# Patient Record
Sex: Male | Born: 1970 | Race: White | Hispanic: No | Marital: Single | Smoking: Never smoker
Health system: Southern US, Community
[De-identification: ages and names within clinical notes are randomized; demographics above are authoritative.]

## PROBLEM LIST (undated history)

## (undated) DIAGNOSIS — F319 Bipolar disorder, unspecified: Secondary | ICD-10-CM

## (undated) DIAGNOSIS — K219 Gastro-esophageal reflux disease without esophagitis: Secondary | ICD-10-CM

## (undated) DIAGNOSIS — G2401 Drug induced subacute dyskinesia: Secondary | ICD-10-CM

## (undated) DIAGNOSIS — F202 Catatonic schizophrenia: Secondary | ICD-10-CM

## (undated) DIAGNOSIS — R4701 Aphasia: Secondary | ICD-10-CM

## (undated) DIAGNOSIS — F259 Schizoaffective disorder, unspecified: Secondary | ICD-10-CM

## (undated) HISTORY — DX: Gastro-esophageal reflux disease without esophagitis: K21.9

## (undated) HISTORY — DX: Schizoaffective disorder, unspecified: F25.9

## (undated) SURGERY — ESOPHAGOGASTRODUODENOSCOPY (EGD) WITH PROPOFOL
Anesthesia: Monitor Anesthesia Care

---

## 2018-11-14 DIAGNOSIS — I10 Essential (primary) hypertension: Secondary | ICD-10-CM | POA: Diagnosis not present

## 2018-11-21 DIAGNOSIS — M7918 Myalgia, other site: Secondary | ICD-10-CM | POA: Diagnosis not present

## 2018-12-07 ENCOUNTER — Ambulatory Visit (HOSPITAL_COMMUNITY): Payer: Self-pay | Admitting: Psychiatry

## 2019-01-03 ENCOUNTER — Other Ambulatory Visit: Payer: Self-pay

## 2019-01-03 ENCOUNTER — Encounter (HOSPITAL_COMMUNITY): Payer: Self-pay | Admitting: Psychiatry

## 2019-01-03 ENCOUNTER — Ambulatory Visit (INDEPENDENT_AMBULATORY_CARE_PROVIDER_SITE_OTHER): Payer: Medicare Other | Admitting: Psychiatry

## 2019-01-03 DIAGNOSIS — F250 Schizoaffective disorder, bipolar type: Secondary | ICD-10-CM | POA: Diagnosis not present

## 2019-01-03 DIAGNOSIS — G2571 Drug induced akathisia: Secondary | ICD-10-CM | POA: Diagnosis not present

## 2019-01-03 DIAGNOSIS — T43505A Adverse effect of unspecified antipsychotics and neuroleptics, initial encounter: Secondary | ICD-10-CM | POA: Diagnosis not present

## 2019-01-03 MED ORDER — LORAZEPAM 0.5 MG PO TABS: 0.5000 mg | ORAL_TABLET | Freq: Three times a day (TID) | ORAL | 2 refills | Status: DC | PRN

## 2019-01-03 MED ORDER — POLYETHYLENE GLYCOL 3350 17 G PO PACK: 17.0000 g | PACK | Freq: Every day | ORAL | 0 refills | Status: DC | PRN

## 2019-01-03 MED ORDER — VITAMIN B 12 500 MCG PO TABS: 500.0000 ug | ORAL_TABLET | Freq: Every day | ORAL | 0 refills | Status: DC

## 2019-01-03 MED ORDER — SERTRALINE HCL 50 MG PO TABS: 150.0000 mg | ORAL_TABLET | Freq: Every day | ORAL | 0 refills | Status: DC

## 2019-01-03 MED ORDER — OMEPRAZOLE 40 MG PO CPDR: 40.0000 mg | DELAYED_RELEASE_CAPSULE | Freq: Every day | ORAL | 0 refills | Status: DC

## 2019-01-03 MED ORDER — PALIPERIDONE ER 6 MG PO TB24: 6.0000 mg | ORAL_TABLET | Freq: Every day | ORAL | 0 refills | Status: DC

## 2019-01-03 MED ORDER — TRAZODONE HCL 50 MG PO TABS: 50.0000 mg | ORAL_TABLET | Freq: Every day | ORAL | 0 refills | Status: DC

## 2019-01-03 MED ORDER — ATENOLOL 25 MG PO TABS: 25.0000 mg | ORAL_TABLET | Freq: Every day | ORAL | 0 refills | Status: DC

## 2019-01-03 NOTE — Progress Notes (Signed)
Psychiatric Initial Adult Assessment   Patient Identification: Troy Fox MRN:  [id number] Date of Evaluation:  01/03/2019 Referral Source: self Chief Complaint:  Depression, apathy, tremor Visit Diagnosis:    ICD-10-CM   1. Schizoaffective disorder, bipolar type (HCC) F25.0   2. Acute neuroleptic-induced akathisia G25.71    T43.505A    Interview was conducted using WebEx teleconferencing application and I verified that I was speaking with the correct person using two identifiers. I discussed the limitations of evaluation and management by telemedicine and  the availability of in person appointments. Patient expressed understanding and agreed to proceed.  History of Present Illness:  This is the initial visit to establish care for this 48 yo single WM who has recently moved to the area from [place], [place]. Patient presents with pronounced psychomotor retardation, minimal verbal output, and most information is provided by his parents (father is his HCPOA). Patient has along hx of schizoaffective disorder bipolar type and has been psychiatrically hospitalized first tine at age 86. Several hospitalizations followed (all in [place]) with most recentin November 2019 at [facility] for psychosis with catatonia thought to be precipitated by dehydration and malnutrition (patient has been living alone in an apartment there). He was first on medical floor then transferred to psychiatry. He received IV fluids then 8 courses of ECT. From the hospital he was discharged to [facility] - a residential treatment facility where he stayed until February 8th when he was briught to [facility] by his father. Emeal now will remain with his parents. All current medications were given on discharge from hospital. Previously he he has been under CTT (community treatment team) care there. He has Medicare in addition to [facility] and therefore is not eligible for ACT services in [place]. Patient admits to still feeling  depressed, albeit not suicidal, and both he and parents would like him to start individual counseling.  Patient denies ever abusing alcohol or street drugs. He has hx of hallucinations and delusions (these were present at the time of most recent admission but he denies having any at this time). His hx of medication compliance is unknown. He is not suicidal and denies ever attempting suicide. He has been having a lot of pacing/restlessness and has tremor. He has pronounced negative sx: apathy, cognitive slowing, avolition, flat affect, slow, concrete speech with long latency. He needs prompting/encouragement to answer questions. Parents report that he sleeps a lot - goes to bed around 9 PM, they wake him up 12 hours later to give him AM meds but he goes back to sleep to finally wake up around noon.  Medically he complains of stomach pain but while in the hospital he had EGD and colonoscopy and no abnormalities were noted besides diverticula. He has chronic problems with constipation and is on Miralax qod and Prilosec. He also is on vitamin B12 1000 mcg daily and atenolol presumably for akathisia. Patient is reportedly allergic to aripiprazole but type of allergy is unknown?  Associated Signs/Symptoms: Depression Symptoms:  depressed mood, psychomotor retardation, difficulty concentrating, impaired memory, loss of energy/fatigue, (Hypo) Manic Symptoms:  None Anxiety Symptoms:  None reported Psychotic Symptoms:  None at this time PTSD Symptoms: Negative  Past Psychiatric History: see above  Previous Psychotropic Medications: Yes   Substance Abuse History in the last 12 months:  No.  Consequences of Substance Abuse: Negative NA  Past Medical History:  Past Medical History:  Diagnosis Date  . GERD (gastroesophageal reflux disease)   . Schizoaffective disorder (HCC)  History reviewed. No pertinent surgical history.  Family Psychiatric History: Unknown - patient was adopted.  Family  History:  Family History  Adopted: Yes  Family history unknown: Yes    Social History:   Social History   Socioeconomic History  . Marital status: Single    Spouse name: Not on file  . Number of children: Not on file  . Years of education: Not on file  . Highest education level: Not on file  Occupational History  . Not on file  Social Needs  . Financial resource strain: Not on file  . Food insecurity:    Worry: Not on file    Inability: Not on file  . Transportation needs:    Medical: Not on file    Non-medical: Not on file  Tobacco Use  . Smoking status: Never Smoker  . Smokeless tobacco: Never Used  Substance and Sexual Activity  . Alcohol use: Never    Frequency: Never  . Drug use: Never  . Sexual activity: Not Currently  Lifestyle  . Physical activity:    Days per week: Not on file    Minutes per session: Not on file  . Stress: Not on file  Relationships  . Social connections:    Talks on phone: Not on file    Gets together: Not on file    Attends religious service: Not on file    Active member of club or organization: Not on file    Attends meetings of clubs or organizations: Not on file    Relationship status: Not on file  Other Topics Concern  . Not on file  Social History Narrative  . Not on file     Allergies:  Allergies not on file  Metabolic Disorder Labs: No results found for: HGBA1C, MPG No results found for: PROLACTIN No results found for: CHOL, TRIG, HDL, CHOLHDL, VLDL, LDLCALC No results found for: TSH  Therapeutic Level Labs: No results found for: LITHIUM No results found for: CBMZ No results found for: VALPROATE  Current Medications: Current Outpatient Medications  Medication Sig Dispense Refill  . atenolol (TENORMIN) 25 MG tablet Take 1 tablet (25 mg total) by mouth daily. 90 tablet 0  . Cyanocobalamin (VITAMIN B 12) 500 MCG TABS Take 500 mcg by mouth daily. 90 tablet 0  . LORazepam (ATIVAN) 0.5 MG tablet Take 1 tablet (0.5 mg  total) by mouth every 8 (eight) hours as needed for anxiety. 90 tablet 2  . omeprazole (PRILOSEC) 40 MG capsule Take 1 capsule (40 mg total) by mouth daily. 90 capsule 0  . paliperidone (INVEGA) 6 MG 24 hr tablet Take 1 tablet (6 mg total) by mouth daily. 90 tablet 0  . polyethylene glycol (MIRALAX) 17 g packet Take 17 g by mouth daily as needed for moderate constipation. 72 packet 0  . sertraline (ZOLOFT) 50 MG tablet Take 3 tablets (150 mg total) by mouth daily. 270 tablet 0  . traZODone (DESYREL) 50 MG tablet Take 1 tablet (50 mg total) by mouth at bedtime. 90 tablet 0   No current facility-administered medications for this visit.     Psychiatric Specialty Exam: Review of Systems  Constitutional: Positive for malaise/fatigue.  HENT: Negative.   Eyes: Negative.   Respiratory: Negative.   Cardiovascular: Negative.   Gastrointestinal: Positive for abdominal pain, constipation and heartburn.  Genitourinary: Negative.   Musculoskeletal: Negative.   Skin: Negative.   Neurological: Positive for tremors.  Endo/Heme/Allergies: Negative.   Psychiatric/Behavioral: Positive for depression.  There were no vitals taken for this visit.There is no height or weight on file to calculate BMI.  General Appearance: Casual and very slow to respond to quetions (selectively).  Eye Contact:  Minimal  Speech:  Blocked and Slow  Volume:  Decreased  Mood:  Depressed  Affect:  Flat  Thought Process:  Concrete  Orientation:  Other:  self, place, environment/parants  Thought Content:  Denies having AVH, paranoia.  Suicidal Thoughts:  No  Homicidal Thoughts:  No  Memory:  impossible to test but appears to be markedly impaired.  Judgement:  Other:  limited  Insight:  Shallow  Psychomotor Activity:  EPS, Psychomotor Retardation and Tremor  Concentration:  Concentration: Poor  Recall:  Poor  Fund of Knowledge:Poor  Language: Poor  Akathisia:  Not observed but parants report him to be pacing often   Handed:  Right  AIMS (if indicated):  not done  Assets:  Housing Social Support  ADL's:  Intact  Cognition: Impaired,  Mild  Sleep:  Excessive    Assessment and Plan: This is the initial visit to establish care for this 48 yo single WM who has recently moved to the area from [place], [place]. Patient presents with pronounced psychomotor retardation, minimal verbal output, and most information is provided by his parents (father is his HCPOA). Patient has along hx of schizoaffective disorder bipolar type and has been psychiatrically hospitalized first tine at age 41. Several hospitalizations followed (all in [place]) with most recentin November 2019 at [facility] for psychosis with catatonia thought to be precipitated by dehydration and malnutrition (patient has been living alone in an apartment there). He was first on medical floor then transferred to psychiatry. He received IV fluids then 8 courses of ECT. From the hospital he was discharged to [facility] - a residential treatment facility where he stayed until February 8th when he was briught to [facility] by his father. Kelsie now will remain with his parents. All current medications were given on discharge from hospital. Previously he he has been under CTT (community treatment team) care there. He has Medicare in addition to [facility] and therefore is not eligible for ACT services in [place]. Patient admits to still feeling depressed, albeit not suicidal, and both he and parents would like him to start individual counseling.  Patient denies ever abusing alcohol or street drugs. He has hx of hallucinations and delusions (these were present at the time of most recent admission but he denies having any at this time). His hx of medication compliance is unknown. He is not suicidal and denies ever attempting suicide. He has been having a lot of pacing/restlessness and has tremor. He has pronounced negative sx: apathy, cognitive slowing, avolition,  flat affect, slow, concrete speech with long latency. He needs prompting/encouragement to answer questions. Parents report that he sleeps a lot - goes to bed around 9 PM, they wake him up 12 hours later to give him AM meds but he goes back to sleep to finally wake up around noon. Father reports that he has brought last hospitalization record to our office but I have no access to it at this time.  Dx impression: Schizoaffective disorder bipolar type, currently depressed; Neuroleptic induced EPS/akathisia  Plan: Continue recent medications with three changes intended to minimize level of sedation/EPS: decrease Invega from 9 to 6 mg daily, trazodone from 100 mg to 50 mg at HS, and increase sertraline from 100 mg to 150 mg to decrease depression. I also recommended using  lorazepam 0.5 mg tid on prn not scheduled basis. Parents would like him to have individual counseling although I have serious doubts that will benefit at this time due to pronounced negative sx including poor memory (possible a consequence of ECT). This patient would be theoretically more appropriate for a [facility] services and we should look into such option availability in the place he will be living. The plan was discussed with patient and more so with his father. I spend 60 minutes in face to face clinical contact with the patient via Webex telemedicine application and devoted approximately 50% of this time to explanation of diagnosis, discussion of treatment options and med education. Next appointment in early June.    Magdalene Patricialgierd A Gustie Bobb, MD 4/22/202012:09 PM

## 2019-01-10 ENCOUNTER — Telehealth (HOSPITAL_COMMUNITY): Payer: Self-pay

## 2019-01-10 NOTE — Telephone Encounter (Signed)
Acknowledged. He was on a relatively low dose of lorazepam 0.5 mg tid so should not have a protracted withdrawal.  OP

## 2019-01-10 NOTE — Telephone Encounter (Signed)
Patients father is calling,he states that patient is currently withdrawing from the Ativan, they are giving him I tablet at bedtime. He states that patient is also still having some nausea. The plan that patients father and I discussed is to continue the 1 ativan at bedtime for now, they have been doing this for about a week, if his anxiety persists then they will call back and I will discuss a non addictive anxiety medication with the doctor. For nausea I told patients father to make sure patient is taking medications with food and continue use of Pepto as that seems to be helping. If they have any further questions they will call me back to discuss.

## 2019-01-16 DIAGNOSIS — Z0000 Encounter for general adult medical examination without abnormal findings: Secondary | ICD-10-CM | POA: Diagnosis not present

## 2019-01-17 ENCOUNTER — Telehealth (HOSPITAL_COMMUNITY): Payer: Self-pay

## 2019-01-17 NOTE — Telephone Encounter (Signed)
He can try BuSpar 5 mg up to twice a day.  Sometimes BuSpar makes people groggy and he need to watch for that.  Please call the prescription to his pharmacy.

## 2019-01-17 NOTE — Telephone Encounter (Signed)
This is a patient of Dr. Hinton Dyer - He is tapering down off of the Ativan and only taking 1 tablet at bedtime. However patient is still having a lot of anxiety and is wanting to know if he can try Buspar as it is not addictive. Please see past messages regarding this. Thank you

## 2019-01-18 ENCOUNTER — Telehealth (HOSPITAL_COMMUNITY): Payer: Self-pay

## 2019-01-18 MED ORDER — BUSPIRONE HCL 5 MG PO TABS: 5.0000 mg | ORAL_TABLET | Freq: Two times a day (BID) | ORAL | 0 refills | Status: DC

## 2019-01-18 NOTE — Telephone Encounter (Signed)
Thank you, called patients dad

## 2019-01-18 NOTE — Telephone Encounter (Signed)
Dr. Lolly Mustache reviewed the chart and prescribed Buspar 5 mg BID, I called patients father to let him know and he mentioned patient has some Tardive Dyskinesia - finger and toes. We discussed Ingrezza and he wants to know your opinion. Please review and advise, thank you

## 2019-01-19 DIAGNOSIS — M7918 Myalgia, other site: Secondary | ICD-10-CM | POA: Diagnosis not present

## 2019-01-19 DIAGNOSIS — Z0000 Encounter for general adult medical examination without abnormal findings: Secondary | ICD-10-CM | POA: Diagnosis not present

## 2019-01-19 DIAGNOSIS — Z13220 Encounter for screening for lipoid disorders: Secondary | ICD-10-CM | POA: Diagnosis not present

## 2019-01-20 ENCOUNTER — Emergency Department (HOSPITAL_COMMUNITY)
Admission: EM | Admit: 2019-01-20 | Discharge: 2019-01-20 | Disposition: A | Discharge status: Home / Self Care | Payer: Medicare Other | Attending: Emergency Medicine | Admitting: Emergency Medicine

## 2019-01-20 ENCOUNTER — Encounter (HOSPITAL_COMMUNITY): Payer: Self-pay | Admitting: Emergency Medicine

## 2019-01-20 ENCOUNTER — Other Ambulatory Visit: Payer: Self-pay

## 2019-01-20 DIAGNOSIS — R4182 Altered mental status, unspecified: Secondary | ICD-10-CM | POA: Diagnosis present

## 2019-01-20 DIAGNOSIS — Z79899 Other long term (current) drug therapy: Secondary | ICD-10-CM | POA: Diagnosis not present

## 2019-01-20 DIAGNOSIS — G2401 Drug induced subacute dyskinesia: Secondary | ICD-10-CM | POA: Diagnosis not present

## 2019-01-20 HISTORY — DX: Drug induced subacute dyskinesia: G24.01

## 2019-01-20 LAB — CBC WITH DIFFERENTIAL/PLATELET
Abs Immature Granulocytes: 0.01 10*3/uL (ref 0.00–0.07)
Basophils Absolute: 0 10*3/uL (ref 0.0–0.1)
Basophils Relative: 1 %
Eosinophils Absolute: 0.1 10*3/uL (ref 0.0–0.5)
Eosinophils Relative: 1 %
HCT: 46.3 % (ref 39.0–52.0)
Hemoglobin: 15.8 g/dL (ref 13.0–17.0)
Immature Granulocytes: 0 %
Lymphocytes Relative: 15 %
Lymphs Abs: 1.1 10*3/uL (ref 0.7–4.0)
MCH: 30 pg (ref 26.0–34.0)
MCHC: 34.1 g/dL (ref 30.0–36.0)
MCV: 88 fL (ref 80.0–100.0)
Monocytes Absolute: 0.7 10*3/uL (ref 0.1–1.0)
Monocytes Relative: 10 %
Neutro Abs: 5.2 10*3/uL (ref 1.7–7.7)
Neutrophils Relative %: 73 %
Platelets: 141 10*3/uL — ABNORMAL LOW (ref 150–400)
RBC: 5.26 MIL/uL (ref 4.22–5.81)
RDW: 13.6 % (ref 11.5–15.5)
WBC: 7.1 10*3/uL (ref 4.0–10.5)
nRBC: 0 % (ref 0.0–0.2)

## 2019-01-20 LAB — BASIC METABOLIC PANEL
Anion gap: 11 (ref 5–15)
BUN: 14 mg/dL (ref 6–20)
CO2: 24 mmol/L (ref 22–32)
Calcium: 9.6 mg/dL (ref 8.9–10.3)
Chloride: 108 mmol/L (ref 98–111)
Creatinine, Ser: 0.62 mg/dL (ref 0.61–1.24)
GFR calc Af Amer: >60 mL/min (ref >60)
GFR calc non Af Amer: >60 mL/min (ref >60)
Glucose, Bld: 113 mg/dL — ABNORMAL HIGH (ref 70–99)
Potassium: 3.8 mmol/L (ref 3.5–5.1)
Sodium: 143 mmol/L (ref 135–145)

## 2019-01-20 MED ORDER — BENZTROPINE MESYLATE 1 MG PO TABS: 1.0000 mg | ORAL_TABLET | Freq: Two times a day (BID) | ORAL | 0 refills | Status: DC

## 2019-01-20 MED ORDER — BENZTROPINE MESYLATE 1 MG/ML IJ SOLN
1.0000 mg | Freq: Once | INTRAMUSCULAR | Status: AC
  Administered 2019-01-20: 13:00:00 1 mg via INTRAMUSCULAR
  Filled 2019-01-20: qty 1

## 2019-01-20 NOTE — ED Provider Notes (Signed)
[facility] EMERGENCY DEPARTMENT Provider Note   CSN: [id number] Arrival date & time: 01/20/19  1157    History   Chief Complaint Chief Complaint  Patient presents with  . Altered Mental Status    HPI Troy Fox is a 48 y.o. male with a history of GERD, schizoaffective disorder, tardive dyskinesia presenting to emergency department today with chief complaint of worsening tardive dyskinesia x2 days.  Patient is accompanied by his father today.  His father reports over the last 2 days patient has not wanted to eat, drink and has become more withdrawn and then normal.  He has noticed tremor in patient's mouth, tapping of his left foot and continuous movement of his fingers.    Father also states that patient saw psychiatrist on 01/03/19 and had medication changes.  His trazodone was changed from 100 mg to 50 mg PO daily, Zoloft was increased from 100 mg to 150 p.o. daily and his Invega was decreased from 9 mg to 6 mg p.o. daily.  His Ativan was also changed from 0.5 TID to 0.5 prn p.o. daily.  Father reports patient had an episode of catatonia back in January after significant decreased p.o. intake for unknown length of time.  Patient at that time was living alone in an apartment in [place].  History provided by patient's father with additional history obtained from chart review.       Past Medical History:  Diagnosis Date  . GERD (gastroesophageal reflux disease)   . Schizoaffective disorder (HCC)   . Tardive dyskinesia     Patient Active Problem List   Diagnosis Date Noted  . Schizoaffective disorder, bipolar type (HCC) 01/03/2019  . Acute neuroleptic-induced akathisia 01/03/2019    History reviewed. No pertinent surgical history.      Home Medications    Prior to Admission medications   Medication Sig Start Date End Date Taking? Authorizing Provider  atenolol (TENORMIN) 25 MG tablet Take 1 tablet (25 mg total) by mouth daily. 01/03/19 04/03/19 Yes Pucilowski, Olgierd  A, MD  busPIRone (BUSPAR) 5 MG tablet Take 1 tablet (5 mg total) by mouth 2 (two) times daily. Patient taking differently: Take 5 mg by mouth 2 (two) times daily as needed.  01/18/19  Yes Arfeen, Phillips Grout, MD  Cyanocobalamin (VITAMIN B 12) 500 MCG TABS Take 500 mcg by mouth daily. 01/03/19 04/03/19 Yes Pucilowski, Olgierd A, MD  LORazepam (ATIVAN) 0.5 MG tablet Take 1 tablet (0.5 mg total) by mouth every 8 (eight) hours as needed for anxiety. 01/03/19 04/03/19 Yes Pucilowski, Olgierd A, MD  omeprazole (PRILOSEC) 40 MG capsule Take 1 capsule (40 mg total) by mouth daily. 01/03/19 04/03/19 Yes Pucilowski, Olgierd A, MD  paliperidone (INVEGA) 6 MG 24 hr tablet Take 1 tablet (6 mg total) by mouth daily. 01/03/19 04/03/19 Yes Pucilowski, Olgierd A, MD  polyethylene glycol (MIRALAX) 17 g packet Take 17 g by mouth daily as needed for moderate constipation. 01/03/19 04/03/19 Yes Pucilowski, Olgierd A, MD  sertraline (ZOLOFT) 50 MG tablet Take 3 tablets (150 mg total) by mouth daily. 01/03/19 04/03/19 Yes Pucilowski, Olgierd A, MD  traZODone (DESYREL) 50 MG tablet Take 1 tablet (50 mg total) by mouth at bedtime. 01/03/19 04/03/19 Yes Pucilowski, Olgierd A, MD  benztropine (COGENTIN) 1 MG tablet Take 1 tablet (1 mg total) by mouth 2 (two) times daily for 7 days. 01/20/19 01/27/19  , Caroleen Hamman, PA-C    Family History Family History  Adopted: Yes  Family history unknown: Yes  Social History Social History   Tobacco Use  . Smoking status: Never Smoker  . Smokeless tobacco: Never Used  Substance Use Topics  . Alcohol use: Never    Frequency: Never  . Drug use: Never     Allergies   Patient has no known allergies.   Review of Systems Review of Systems  Unable to perform ROS: Psychiatric disorder  Neurological: Positive for tremors.     Physical Exam Updated Vital Signs BP 135/74 (BP Location: Left Arm)   Pulse 87   Temp 98 F (36.7 C) (Oral)   Resp 14   Ht 5\' 4"  (1.626 m)   Wt 63.5 kg    SpO2 95%   BMI 24.03 kg/m   Physical Exam Vitals signs and nursing note reviewed.  Constitutional:      Appearance: He is well-developed. He is not toxic-appearing.     Comments: Patient sitting comfortably on exam stretcher.  He is nontoxic-appearing. Tremor noted to bottom lip. Pill rolling movement in bilateral hands. Left foot tapping ground repeatedly  HENT:     Head: Normocephalic and atraumatic.     Jaw: There is normal jaw occlusion. No tenderness or swelling.  Eyes:     General: No scleral icterus.       Right eye: No discharge.        Left eye: No discharge.     Conjunctiva/sclera: Conjunctivae normal.  Neck:     Musculoskeletal: Normal range of motion.  Cardiovascular:     Rate and Rhythm: Normal rate and regular rhythm.     Pulses: Normal pulses.          Radial pulses are 2+ on the right side and 2+ on the left side.     Heart sounds: Normal heart sounds.  Pulmonary:     Effort: Pulmonary effort is normal.     Breath sounds: Normal breath sounds.  Abdominal:     General: There is no distension.     Palpations: Abdomen is soft.     Tenderness: There is no abdominal tenderness. There is no guarding or rebound.  Musculoskeletal: Normal range of motion.     Right lower leg: No edema.     Left lower leg: No edema.     Comments: Full ROM of bilateral upper and lower extremities.  Skin:    General: Skin is warm and dry.     Capillary Refill: Capillary refill takes less than 2 seconds.     Findings: No lesion or rash.  Neurological:     Mental Status: He is oriented to person, place, and time.     Comments: Patient responding to simple questions with one-word answers.  He is oriented to self, place and time.  Visual tracking intact.  Sensation is intact to sharp and dull in bilateral upper and lower extremities.  Psychiatric:        Behavior: Behavior normal.        ED Treatments / Results  Labs (all labs ordered are listed, but only abnormal results are  displayed) Labs Reviewed  CBC WITH DIFFERENTIAL/PLATELET - Abnormal; Notable for the following components:      Result Value   Platelets 141 (*)    All other components within normal limits  BASIC METABOLIC PANEL - Abnormal; Notable for the following components:   Glucose, Bld 113 (*)    All other components within normal limits    EKG None  Radiology No results found.  Procedures Procedures (including critical care  time)  Medications Ordered in ED Medications  benztropine mesylate (COGENTIN) injection 1 mg (1 mg Intramuscular Given 01/20/19 1322)     Initial Impression / Assessment and Plan / ED Course  I have reviewed the triage vital signs and the nursing notes.  Pertinent labs & imaging results that were available during my care of the patient were reviewed by me and considered in my medical decision making (see chart for details).   48 year old male with history of schizophrenia presents with worsening tardive dyskinesia x2 days.  Patient is afebrile, nontoxic-appearing.  On exam he has obvious tremor of mouth, pill rolling in fingers, and tremor of left foot. Pt's father is at the bedside and states pt is not catatonic currently, but he is worried it might happen given his decrease in conversation since symptom onset.  On reassessment patient the tremors have improved.  There is no tremor seen to his mouth, the pill-rolling movement is happening less as well as him moving his foot less.  BMP and CBC are overall unremarkable. Pt case discussed with attending Dr. Deretha Emory who agrees with plan to discharge pt with Cogentin PO. His father plans to call his psychiatrist Monday morning to schedule follow-up.  Patient is hemodynamically stable, in NAD. Evaluation does not show pathology that would require ongoing emergent intervention or inpatient treatment. I explained the diagnosis to the patient. Patient's father  is comfortable with above plan and pt is stable for discharge at  this time. All questions were answered prior to disposition. Strict return precautions for returning to the ED were discussed.    This note was prepared with assistance of Conservation officer, historic buildings. Occasional wrong-word or sound-a-like substitutions may have occurred due to the inherent limitations of voice recognition software.   Final Clinical Impressions(s) / ED Diagnoses   Final diagnoses:  Tardive dyskinesia    ED Discharge Orders         Ordered    benztropine (COGENTIN) 1 MG tablet  2 times daily     01/20/19 1430           , [place], PA-C 01/20/19 1439    Vanetta Mulders, MD 01/21/19 [phone]

## 2019-01-20 NOTE — Discharge Instructions (Signed)
You have been seen today for tardive dyskinesia. Please read and follow all provided instructions. Return to the emergency room for worsening condition or new concerning symptoms.    Is important that you follow-up with psychiatry as planned.  Recommend you call first thing Monday morning to schedule an appointment.  1. Medications:  Prescription sent to your pharmacy for Cogentin.  Please take ever 12 hours as prescribed.  We have prescribed this medication for 1 week.  Pscyhiatrist will determine if Dj should continue this medication or if any dose adjustments need to be made.  Continue usual home medications Take medications as prescribed. Please review all of the medicines and only take them if you do not have an allergy to them.  2. Treatment: rest, drink plenty of fluids. Drink boost shakes to help with calorie intake.  3. Follow Up: Please follow up with psychiatry as we discussed.  Follow up with PCP in 2 to 5 days if needed.  It is also a possibility that you have an allergic reaction to any of the medicines that you have been prescribed - Everybody reacts differently to medications and while MOST people have no trouble with most medicines, you may have a reaction such as nausea, vomiting, rash, swelling, shortness of breath. If this is the case, please stop taking the medicine immediately and contact your physician.  ?

## 2019-01-20 NOTE — ED Triage Notes (Signed)
Pt with hx of tardive dyskinesia accompanied by father. Father concerned that patient has not wanted to eat, drink, and has become increasingly withdrawn over the past few days. States this happened in January and patient went into a catatonic state. Pt will not verbally respond to questions.

## 2019-01-22 ENCOUNTER — Telehealth (HOSPITAL_COMMUNITY): Payer: Self-pay

## 2019-01-22 NOTE — Telephone Encounter (Signed)
I think he should try if it works for him and if he tolerates it (I had a patient once whose EPS worsened a lot on Ingrezza). He should start at 40 mg daily. If we have samples of it he could get 7 days and then, if tolerated,  I would send Rx for 80 mg.  OP

## 2019-01-22 NOTE — Telephone Encounter (Signed)
Patient's dad called regarding his son. Father stated he had a bad weekend with patient and had to take him to emergency. They gave him a shot of Cogentin and gave him #14 Cogentin 1 mg pills. The ED doctor suggested maintenance ECT. He was told if father has another episode with him and needs to take him back to the ED, he can. Patient fell out of bed last night and has a hard time bending his knees. Father also stated that at times he has a hard time maneuvering. He also stated that patient has delusional and paranoid thoughts and is obsessing over his dad putting him in the hospital or an institution. Father stated he does not want to do that. He also stated that he has a video of patient's behavior from thismorning (01/22/19) if doctor would like to see it. Patient's dad is very concerned about him and stated he is not sure how to go forward regarding his son. Please review and advise. Thank you.

## 2019-01-23 MED ORDER — VALBENAZINE TOSYLATE 40 & 80 MG PO CPPK: ORAL_CAPSULE | ORAL | 0 refills | Status: DC

## 2019-01-23 NOTE — Telephone Encounter (Signed)
Patient's father had posed a question. Once he receives his son's Ingrezza should his son take both the Ingrezza and the Cogentin? Thank you.

## 2019-01-23 NOTE — Telephone Encounter (Signed)
Spoke with patient's father and relayed recommendations. Ingrezza was sent in to [facility]. I contacted Nedra Hai at [facility] and set up a consultation for Dr. Toni Amend to evaluate patient. Appointment is set up for 01/29/19. A referral was also done for this in Epic. Patient's dad also agrees with continuing his Cogentin, however, patient only has a couple days left of the medication. Father requested a prescription be sent in to [facility] in [place]. He asked that we request the pharmacy to deliver the Cogentin and stated that the pharmacy will do that.  Thank you.

## 2019-01-23 NOTE — Telephone Encounter (Signed)
I agree that he should for now continue Cogentin and that maintenance ECT would be a great choice. At [facility] only place it is done is [facility] (Dr. Toni Amend). We should ask him to evaluate patient. I also agree with a trial of Ingrezza for tardive dyskinesia - left a message about it for Regan yesterday.  OP.

## 2019-01-24 ENCOUNTER — Emergency Department (HOSPITAL_COMMUNITY): Payer: Medicare Other

## 2019-01-24 ENCOUNTER — Emergency Department (HOSPITAL_COMMUNITY)
Admission: EM | Admit: 2019-01-24 | Discharge: 2019-01-24 | Disposition: A | Discharge status: Home / Self Care | Payer: Medicare Other | Attending: Emergency Medicine | Admitting: Emergency Medicine

## 2019-01-24 ENCOUNTER — Telehealth: Payer: Self-pay | Admitting: Licensed Clinical Social Worker

## 2019-01-24 ENCOUNTER — Encounter (HOSPITAL_COMMUNITY): Payer: Self-pay | Admitting: Emergency Medicine

## 2019-01-24 DIAGNOSIS — Z79899 Other long term (current) drug therapy: Secondary | ICD-10-CM | POA: Insufficient documentation

## 2019-01-24 DIAGNOSIS — F22 Delusional disorders: Secondary | ICD-10-CM | POA: Diagnosis not present

## 2019-01-24 DIAGNOSIS — G2401 Drug induced subacute dyskinesia: Secondary | ICD-10-CM | POA: Diagnosis not present

## 2019-01-24 DIAGNOSIS — F259 Schizoaffective disorder, unspecified: Secondary | ICD-10-CM

## 2019-01-24 DIAGNOSIS — F250 Schizoaffective disorder, bipolar type: Secondary | ICD-10-CM | POA: Insufficient documentation

## 2019-01-24 DIAGNOSIS — M545 Low back pain, unspecified: Secondary | ICD-10-CM

## 2019-01-24 DIAGNOSIS — J984 Other disorders of lung: Secondary | ICD-10-CM | POA: Diagnosis not present

## 2019-01-24 DIAGNOSIS — M5489 Other dorsalgia: Secondary | ICD-10-CM | POA: Diagnosis present

## 2019-01-24 DIAGNOSIS — R404 Transient alteration of awareness: Secondary | ICD-10-CM | POA: Diagnosis not present

## 2019-01-24 DIAGNOSIS — R2681 Unsteadiness on feet: Secondary | ICD-10-CM | POA: Diagnosis not present

## 2019-01-24 DIAGNOSIS — R269 Unspecified abnormalities of gait and mobility: Secondary | ICD-10-CM

## 2019-01-24 HISTORY — DX: Aphasia: R47.01

## 2019-01-24 HISTORY — DX: Catatonic schizophrenia: F20.2

## 2019-01-24 HISTORY — DX: Bipolar disorder, unspecified: F31.9

## 2019-01-24 LAB — URINALYSIS, ROUTINE W REFLEX MICROSCOPIC
Bilirubin Urine: NEGATIVE
Glucose, UA: NEGATIVE mg/dL
Hgb urine dipstick: NEGATIVE
Ketones, ur: 20 mg/dL — AB
Leukocytes,Ua: NEGATIVE
Nitrite: NEGATIVE
Protein, ur: NEGATIVE mg/dL
Specific Gravity, Urine: 1.021 (ref 1.005–1.030)
pH: 6 (ref 5.0–8.0)

## 2019-01-24 LAB — COMPREHENSIVE METABOLIC PANEL
ALT: 17 U/L (ref 0–44)
AST: 22 U/L (ref 15–41)
Albumin: 4.1 g/dL (ref 3.5–5.0)
Alkaline Phosphatase: 50 U/L (ref 38–126)
Anion gap: 14 (ref 5–15)
BUN: 17 mg/dL (ref 6–20)
CO2: 25 mmol/L (ref 22–32)
Calcium: 9.7 mg/dL (ref 8.9–10.3)
Chloride: 108 mmol/L (ref 98–111)
Creatinine, Ser: 0.9 mg/dL (ref 0.61–1.24)
GFR calc Af Amer: >60 mL/min (ref >60)
GFR calc non Af Amer: >60 mL/min (ref >60)
Glucose, Bld: 111 mg/dL — ABNORMAL HIGH (ref 70–99)
Potassium: 3.6 mmol/L (ref 3.5–5.1)
Sodium: 147 mmol/L — ABNORMAL HIGH (ref 135–145)
Total Bilirubin: 1.1 mg/dL (ref 0.3–1.2)
Total Protein: 7.6 g/dL (ref 6.5–8.1)

## 2019-01-24 LAB — CBC WITH DIFFERENTIAL/PLATELET
Abs Immature Granulocytes: 0.02 10*3/uL (ref 0.00–0.07)
Basophils Absolute: 0 10*3/uL (ref 0.0–0.1)
Basophils Relative: 0 %
Eosinophils Absolute: 0 10*3/uL (ref 0.0–0.5)
Eosinophils Relative: 0 %
HCT: 46.6 % (ref 39.0–52.0)
Hemoglobin: 15.4 g/dL (ref 13.0–17.0)
Immature Granulocytes: 0 %
Lymphocytes Relative: 10 %
Lymphs Abs: 0.9 10*3/uL (ref 0.7–4.0)
MCH: 29.7 pg (ref 26.0–34.0)
MCHC: 33 g/dL (ref 30.0–36.0)
MCV: 90 fL (ref 80.0–100.0)
Monocytes Absolute: 0.8 10*3/uL (ref 0.1–1.0)
Monocytes Relative: 10 %
Neutro Abs: 6.9 10*3/uL (ref 1.7–7.7)
Neutrophils Relative %: 80 %
Platelets: 124 10*3/uL — ABNORMAL LOW (ref 150–400)
RBC: 5.18 MIL/uL (ref 4.22–5.81)
RDW: 13.2 % (ref 11.5–15.5)
WBC: 8.6 10*3/uL (ref 4.0–10.5)
nRBC: 0 % (ref 0.0–0.2)

## 2019-01-24 LAB — RAPID URINE DRUG SCREEN, HOSP PERFORMED
Amphetamines: NOT DETECTED
Barbiturates: NOT DETECTED
Benzodiazepines: NOT DETECTED
Cocaine: NOT DETECTED
Opiates: NOT DETECTED
Tetrahydrocannabinol: NOT DETECTED

## 2019-01-24 LAB — ETHANOL: Alcohol, Ethyl (B): <10 mg/dL (ref <10)

## 2019-01-24 MED ORDER — BENZTROPINE MESYLATE 1 MG PO TABS
1.0000 mg | ORAL_TABLET | Freq: Once | ORAL | Status: AC
  Administered 2019-01-24: 1 mg via ORAL
  Filled 2019-01-24: qty 1

## 2019-01-24 MED ORDER — ATENOLOL 25 MG PO TABS
25.0000 mg | ORAL_TABLET | Freq: Once | ORAL | Status: AC
  Administered 2019-01-24: 25 mg via ORAL
  Filled 2019-01-24: qty 1

## 2019-01-24 MED ORDER — SERTRALINE HCL 50 MG PO TABS
150.0000 mg | ORAL_TABLET | Freq: Once | ORAL | Status: DC
  Filled 2019-01-24: qty 3

## 2019-01-24 MED ORDER — PALIPERIDONE ER 6 MG PO TB24
6.0000 mg | ORAL_TABLET | Freq: Every day | ORAL | Status: DC
  Filled 2019-01-24 (×3): qty 1
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
## 2019-01-24 NOTE — Clinical Social Work Note (Signed)
Patient presented in the ED due to father, Mr. Perret, being concerned about injury related to a fall. Patient was assessed and cleared by TTS. Patient resides in the home with his father and father's wife. Mr. Madilyn Fireman indicates that patient is not violent, not SI/HI nor is he fearful of patient. He stated that patient got up last night to shave in the middle of the night and fell against the door. He indicated that the noise made by patient falling backwards against the door awakened him.  He stated that he manages patient well during the day but at night he is concerned that patient will fall due to some gait instability. Mr. Jacques was not interested in assisted devices for gait.  He stated that he has ordered security devices but they have not arrived in the home as of yet. Mr. Jonassen stated that he is not interested in LTC for patient and stated "I want to take care of him." He indicated that patient stopped verbalizing a week ago. He has an appointment at 1:00 p.m. with [facility] on Monday at 1:00 as a recommendation from his psychiatrist/doctor and he hopes to get patient involved in a regime of ECT therapy which has assisted in the past. Mr. Leang stated that if there were no short term crisis beds available he would take patient home and manage until his appointment on Monday as he was not interested in ALF/FCH placement.    Troy Fox, Juleen China, LCSW

## 2019-01-24 NOTE — ED Notes (Signed)
Patient's father states that pt was verbal until a week ago. States patient has a history of schizophrenia with Bipolar, and catatonia. States pt has recently expressed to him delusions that the cops are after him to take him to jail and that [name] is inside of him.

## 2019-01-24 NOTE — ED Notes (Signed)
Pt to CT

## 2019-01-24 NOTE — ED Triage Notes (Signed)
Pt here by [facility], pt is non-verbal, called out by father for concerns of variation in gait, concern for pulled back muscle

## 2019-01-24 NOTE — ED Notes (Signed)
Per father, pt is declining mentally. States his son is normally pretty non verbal, but is expressing thoughts of delusions and paranoia. States he has been saying the police are coming to take him. Pt is ambulatory, but that has been declining as well. Is not sure how much longer he is going to be able to care for him. Wants him transferred to [facility]. Explained to him that I did not know if this was possible due to him being nonverbal and non ambulatory.

## 2019-01-24 NOTE — Discharge Instructions (Addendum)
As discussed plan to proceed with the ECT consult on Monday.  You may return here for a repeat  assessment if his symptoms escalate, especially for any concern of suicidal or homicidal ideation.

## 2019-01-24 NOTE — BH Assessment (Signed)
Tele Assessment Note   Patient Name: Troy DuhamelStephen Starry MRN: [id number] Referring Physician: Dr. Clarene DukeMcManus Location of Patient: [facility] Location of Provider: Behavioral Health TTS Department  Troy DuhamelStephen Neumeister is an 48 y.o. male. Pt is non-verbal. The Pt's father Mr. [place] BingHays spoke for the Pt. The Pt has been diagnosed with MR and Schizoaffective disorder. Per Pt's father the Pt was verbal before November 2019 but after an hospitalization he became non-verbal. The Pt is not able to care for himself at this time. The Pt's father said that the Pt is having delusions as well. The Pt has an IQ of 270 per father. Pt's father denied that the Pt is suicidal. Pt's father is seeking assistance because he said he is unable to supervise him 24 hours a day.   Pt's father stated that the Pt has an ECT appointment on 5/18.  Garlan FillersLaShunda, NP recommends D/C and follow-up with ECT appointment.  Diagnosis:  F25.0 Schizoaffective  Past Medical History:  Past Medical History:  Diagnosis Date  . GERD (gastroesophageal reflux disease)   . Schizoaffective disorder (HCC)   . Tardive dyskinesia     History reviewed. No pertinent surgical history.  Family History:  Family History  Adopted: Yes  Family history unknown: Yes    Social History:  reports that he has never smoked. He has never used smokeless tobacco. He reports that he does not drink alcohol or use drugs.  Additional Social History:  Alcohol / Drug Use Pain Medications: please see mar Prescriptions: please see mar Over the Counter: please see mar History of alcohol / drug use?: No history of alcohol / drug abuse Longest period of sobriety (when/how long): NA  CIWA: CIWA-Ar BP: (!) 134/98 Pulse Rate: (!) 122 COWS:    Allergies: No Known Allergies  Home Medications: (Not in a hospital admission)   OB/GYN Status:  No LMP for male patient.  General Assessment Data Location of Assessment: [facility] TTS Assessment: In system Is this a Tele or Face-to-Face  Assessment?: Tele Assessment Is this an Initial Assessment or a Re-assessment for this encounter?: Initial Assessment Patient Accompanied by:: Parent Language Other than English: No Living Arrangements: Other (Comment) What gender do you identify as?: Male Marital status: Single Maiden name: NA Pregnancy Status: No Living Arrangements: Other (Comment) Can pt return to current living arrangement?: No Admission Status: Voluntary Is patient capable of signing voluntary admission?: No Referral Source: Self/Family/Friend Insurance type: [place]     Crisis Care Plan Living Arrangements: Other (Comment) Legal Guardian: Father Name of Psychiatrist: NA Name of Therapist: NA  Education Status Is patient currently in school?: No Is the patient employed, unemployed or receiving disability?: Receiving disability income  Risk to self with the past 6 months Suicidal Ideation: No Has patient been a risk to self within the past 6 months prior to admission? : No Suicidal Intent: No Has patient had any suicidal intent within the past 6 months prior to admission? : No Is patient at risk for suicide?: No Suicidal Plan?: No Has patient had any suicidal plan within the past 6 months prior to admission? : No Access to Means: No What has been your use of drugs/alcohol within the last 12 months?: NA Previous Attempts/Gestures: No How many times?: 0 Other Self Harm Risks: NA Triggers for Past Attempts: None known Intentional Self Injurious Behavior: None Family Suicide History: No Recent stressful life event(s): Other (Comment)(physical health) Persecutory voices/beliefs?: No Depression: No(pt is non-verbal) Depression Symptoms: (pt is non-verbal) Substance abuse history and/or treatment  for substance abuse?: No Suicide prevention information given to non-admitted patients: Not applicable  Risk to Others within the past 6 months Homicidal Ideation: No Does patient have any lifetime risk of  violence toward others beyond the six months prior to admission? : No Thoughts of Harm to Others: No Current Homicidal Intent: No Current Homicidal Plan: No Access to Homicidal Means: No Identified Victim: NA History of harm to others?: No Assessment of Violence: None Noted Violent Behavior Description: NA Does patient have access to weapons?: No Criminal Charges Pending?: No Does patient have a court date: No Is patient on probation?: No  Psychosis Hallucinations: None noted Delusions: Persecutory  Mental Status Report Appearance/Hygiene: Unremarkable Eye Contact: Fair Motor Activity: Freedom of movement Speech: Logical/coherent Level of Consciousness: Alert Mood: Other (Comment)(pt is non-verbal) Affect: Other (Comment)(Pt is non-verbal) Anxiety Level: Minimal Thought Processes: Unable to Assess Judgement: Unable to Assess Orientation: Unable to assess Obsessive Compulsive Thoughts/Behaviors: Unable to Assess  Cognitive Functioning Concentration: Unable to Assess Memory: Unable to Assess Is patient IDD: Yes Level of Function: moderate Is IQ score available?: Yes(70) Insight: Unable to Assess Impulse Control: Unable to Assess Appetite: Poor Have you had any weight changes? : No Change Sleep: Unable to Assess Total Hours of Sleep: 5 Vegetative Symptoms: Staying in bed, Not bathing, Decreased grooming  ADLScreening [facility] Assessment Services) Patient's cognitive ability adequate to safely complete daily activities?: No Patient able to express need for assistance with ADLs?: No Independently performs ADLs?: No  Prior Inpatient Therapy Prior Inpatient Therapy: Yes Prior Therapy Dates: 2019 Prior Therapy Facilty/Provider(s): In [place] Reason for Treatment: schizoaffective  Prior Outpatient Therapy Prior Outpatient Therapy: Yes Prior Therapy Dates: current Prior Therapy Facilty/Provider(s): Dr. Lolly Mustache Reason for Treatment: n Does patient have an ACCT  team?: No Does patient have Intensive In-House Services?  : No Does patient have [facility] services? : No Does patient have P4CC services?: No  ADL Screening (condition at time of admission) Patient's cognitive ability adequate to safely complete daily activities?: No Is the patient deaf or have difficulty hearing?: No Does the patient have difficulty seeing, even when wearing glasses/contacts?: No Does the patient have difficulty concentrating, remembering, or making decisions?: No Patient able to express need for assistance with ADLs?: No Does the patient have difficulty dressing or bathing?: No Independently performs ADLs?: No Communication: Dependent Dressing (OT): Dependent Grooming: Dependent Feeding: Dependent Bathing: Dependent Toileting: Dependent In/Out Bed: Dependent Walks in Home: Dependent       Abuse/Neglect Assessment (Assessment to be complete while patient is alone) Abuse/Neglect Assessment Can Be Completed: Yes Physical Abuse: Denies Verbal Abuse: Denies Sexual Abuse: Denies Exploitation of patient/patient's resources: Denies     Merchant navy officer (For Healthcare) Does Patient Have a Medical Advance Directive?: No Would patient like information on creating a medical advance directive?: No - Patient declined          Disposition:  Disposition Initial Assessment Completed for this Encounter: Yes  This service was provided via telemedicine using a 2-way, interactive audio and video technology.  Names of all persons participating in this telemedicine service and their role in this encounter. Name: Mr. Bracher Role: Father  Name:  Role:   Name:  Role:   Name:  Role:     Emmit Pomfret 01/24/2019 1:34 PM

## 2019-01-25 ENCOUNTER — Other Ambulatory Visit: Payer: Self-pay

## 2019-01-25 NOTE — Patient Outreach (Addendum)
[facility]) Care Management  01/25/2019  Ender Seccombe [birth year] [id number]   Telephone Screen  Referral Date: 01/24/2019 Referral Source: [facility] Dept-Urgent Referral Reason: " per HUB note and per father-patient declining mentally, normally nonverbal, expressing thoughts of delusions and paranoia, not sure how much longer he is going to be able to care for him" Insurance: [facility] Medicare   Outreach attempt # 1 to patient/caregiver. Spoke with patient's father-DPR on file. Father reports that he has been caregiver for son for an extended period of time. He voices that patient has been battling mental health issues for a while. He reports that they used to live in [place] and had unlimited mental health resources. He voices since moving to [place] he has found that the options for care are much more limited. Patient is active with mental health services. He sees psychiatrist. Father voices that his current psych MD is out of the office until June and patient is seeing covering partner. He has an appt on Monday to be evaluated for ECT treatment. Patient has had this in the past and had good results from it. Caregiver voices that patient was recently started on Cogentin which has helped improve his condition some. He is also waiting for pharmacy to get another med in stock that has been prescribed for patient. He voices that he took patient to the ED on yesterday to be evaluated. Behavioral health team did not feel like patient's condition warranted inpatient admission. He states that patient rested well and slept throughout the whole night which is not normal but good for them. He was cooperative this morning and took his meds. He reports that he does not fell like his son has "any quality of life" due to his mental health state. Him and his spouse provide total care to patient. Caregiver voices that they are not interested at all in any type of long term care placement. They desire to  continue caring for the patient in the home. [facility] services reviewed and discussed with caregiver. He does not feel like [facility] especially SW can do anything further that what they are already doing. Patient mental health team is working to assist him care for patient and he does nto feel like anythign further can be done to assist at this time. He is agreeable to RN CM mailing out [facility] brochure and magnet for future reference.     Plan: RN CM will close case at this time.  RN CM will send [facility] letter along with brochure and magnet via mail.   Antionette Fairy, RN,BSN,CCM [facility] Care Management Telephonic Care Management Coordinator Direct Phone: [phone] Toll Free: [phone] Fax: [phone]

## 2019-01-29 ENCOUNTER — Ambulatory Visit (INDEPENDENT_AMBULATORY_CARE_PROVIDER_SITE_OTHER): Payer: Medicare Other | Admitting: Psychiatry

## 2019-01-29 ENCOUNTER — Inpatient Hospital Stay
Admission: AD | Admit: 2019-01-29 | Discharge: 2019-03-05 | DRG: 885 | Disposition: A | Discharge status: Home / Self Care | Payer: Medicare Other | Source: Intra-hospital | Attending: Psychiatry | Admitting: Psychiatry

## 2019-01-29 ENCOUNTER — Other Ambulatory Visit: Payer: Self-pay

## 2019-01-29 ENCOUNTER — Other Ambulatory Visit: Payer: Self-pay | Admitting: Psychiatry

## 2019-01-29 DIAGNOSIS — E441 Mild protein-calorie malnutrition: Secondary | ICD-10-CM | POA: Diagnosis present

## 2019-01-29 DIAGNOSIS — F061 Catatonic disorder due to known physiological condition: Secondary | ICD-10-CM

## 2019-01-29 DIAGNOSIS — Z6823 Body mass index (BMI) 23.0-23.9, adult: Secondary | ICD-10-CM | POA: Diagnosis not present

## 2019-01-29 DIAGNOSIS — R630 Anorexia: Secondary | ICD-10-CM | POA: Diagnosis present

## 2019-01-29 DIAGNOSIS — Z01810 Encounter for preprocedural cardiovascular examination: Secondary | ICD-10-CM | POA: Diagnosis not present

## 2019-01-29 DIAGNOSIS — G479 Sleep disorder, unspecified: Secondary | ICD-10-CM | POA: Diagnosis present

## 2019-01-29 DIAGNOSIS — F251 Schizoaffective disorder, depressive type: Secondary | ICD-10-CM | POA: Diagnosis present

## 2019-01-29 DIAGNOSIS — F250 Schizoaffective disorder, bipolar type: Secondary | ICD-10-CM | POA: Diagnosis not present

## 2019-01-29 DIAGNOSIS — Z79899 Other long term (current) drug therapy: Secondary | ICD-10-CM

## 2019-01-29 DIAGNOSIS — F202 Catatonic schizophrenia: Secondary | ICD-10-CM | POA: Diagnosis present

## 2019-01-29 DIAGNOSIS — K219 Gastro-esophageal reflux disease without esophagitis: Secondary | ICD-10-CM | POA: Diagnosis present

## 2019-01-29 DIAGNOSIS — Z1159 Encounter for screening for other viral diseases: Secondary | ICD-10-CM

## 2019-01-29 LAB — SARS CORONAVIRUS 2 BY RT PCR (HOSPITAL ORDER, PERFORMED IN ~~LOC~~ HOSPITAL LAB): SARS Coronavirus 2: NEGATIVE

## 2019-01-29 MED ORDER — LORAZEPAM 1 MG PO TABS
1.0000 mg | ORAL_TABLET | Freq: Four times a day (QID) | ORAL | Status: DC
  Administered 2019-01-29 – 2019-01-30 (×4): 1 mg via ORAL
  Filled 2019-01-29 (×4): qty 1

## 2019-01-29 MED ORDER — MAGNESIUM HYDROXIDE 400 MG/5ML PO SUSP: 30.0000 mL | Freq: Every day | ORAL | Status: DC | PRN

## 2019-01-29 MED ORDER — VITAMIN B-12 1000 MCG PO TABS
1000.0000 ug | ORAL_TABLET | Freq: Every day | ORAL | Status: DC
  Administered 2019-01-29 – 2019-03-05 (×32): 1000 ug via ORAL
  Filled 2019-01-29 (×35): qty 1

## 2019-01-29 MED ORDER — ACETAMINOPHEN 325 MG PO TABS: 650.0000 mg | ORAL_TABLET | Freq: Four times a day (QID) | ORAL | Status: DC | PRN

## 2019-01-29 MED ORDER — TRAZODONE HCL 100 MG PO TABS
100.0000 mg | ORAL_TABLET | Freq: Every day | ORAL | Status: DC
  Administered 2019-01-29 – 2019-02-17 (×20): 100 mg via ORAL
  Filled 2019-01-29 (×19): qty 1

## 2019-01-29 MED ORDER — ALUM & MAG HYDROXIDE-SIMETH 200-200-20 MG/5ML PO SUSP: 30.0000 mL | ORAL | Status: DC | PRN

## 2019-01-29 MED ORDER — PANTOPRAZOLE SODIUM 40 MG PO TBEC
40.0000 mg | DELAYED_RELEASE_TABLET | Freq: Every day | ORAL | Status: DC
  Administered 2019-01-29 – 2019-03-05 (×32): 40 mg via ORAL
  Filled 2019-01-29 (×35): qty 1

## 2019-01-29 MED ORDER — POLYETHYLENE GLYCOL 3350 17 G PO PACK
17.0000 g | PACK | Freq: Every day | ORAL | Status: DC
  Administered 2019-01-29 – 2019-03-04 (×26): 17 g via ORAL
  Filled 2019-01-29 (×25): qty 1

## 2019-01-29 MED ORDER — PALIPERIDONE ER 3 MG PO TB24: 6.0000 mg | ORAL_TABLET | Freq: Every day | ORAL | Status: DC

## 2019-01-29 NOTE — BH Assessment (Signed)
Patient has been accepted to [facility].  Accepting physician is Dr. Toni Amend.  Attending Physician will be Dr. Toni Amend.  Patient has been assigned to room 309, by [facility] Charge Nurse Demetria.  Call report to [phone].  Representative/Transfer Coordinator is Warden/ranger Patient pre-admitted by [facility] Patient Access [employer]

## 2019-01-29 NOTE — Progress Notes (Signed)
Patient seen in the company of his parents.  Chart reviewed.  48 year old man with a history of chronic mental illness who has been in a catatonia getting worse for the last month or 2.  See full intake note on admission.  Patient has not been eating or drinking adequately, has been losing weight, not able to speak, not thinking clearly clearly having psychotic symptoms.  Patient meets criteria for inpatient admission to begin ECT and I have referred him to the emergency room to be screened prior to admission and have spoken with the TTS representative.

## 2019-01-29 NOTE — Plan of Care (Signed)
Patient is non-verbal at this time, unable to assess.

## 2019-01-29 NOTE — H&P (Signed)
Psychiatric Admission Assessment Adult  Patient Identification: Troy Fox MRN:  [id number] Date of Evaluation:  01/29/2019 Chief Complaint:  Schizoaffective Principal Diagnosis: Schizoaffective disorder, depressive type (HCC) Diagnosis:  Principal Problem:   Schizoaffective disorder, depressive type (HCC) Active Problems:   Catatonia   Mild malnutrition (HCC)  History of Present Illness: This is a 48 year old man with a past history of schizoaffective disorder who is admitted after presentation for ECT consultation.  History was obtained from the chart and from interview with the patient's parents who are present at the interview.  The patient is present but is nonverbal and not able to provide direct information.  This patient has a long history of schizoaffective disorder but for the past 1-1/2 years or so has been going downhill having more and more symptoms of withdrawal depression and catatonia.  He had a series of ECT treatments in [place] last November which were very effective but after relocating to [place] at the beginning of this year has not yet been able to resume ECT and has returned to a catatonic state.  Family reports that for the past several weeks he has hardly moved.  He will eat if food is placed in front of him and they have been able to get him to consume a little bit of fluid.  They have seen him stand up and walk at times but it is very difficult to voluntarily get him to do that.  He has been seen by a psychiatrist for outpatient treatment in [place] but this is his first examination for ECT.  Current medications include Invega Zoloft trazodone all of which were prescribed last in [place].  No evidence that the patient is suicidal or intentionally trying to kill himself no agitation or violence.  Patient does seem to be very withdrawn with poor reality testing.  No evidence or history of substance abuse. Associated Signs/Symptoms: Depression Symptoms:   psychomotor retardation, difficulty concentrating, loss of energy/fatigue, disturbed sleep, weight loss, (Hypo) Manic Symptoms:  None Anxiety Symptoms:  Impossible to identify Psychotic Symptoms:  Grossly disordered thinking PTSD Symptoms: Negative Total Time spent with patient: 1 hour  Past Psychiatric History: Patient reportedly has a history of schizoaffective disorder dating back to his 7s.  Parents report that he was stable on medication for many years but in 2018 had an episode of psychotic depression and withdrawal.  Became very psychotic with religious [place] [employer] delusions.  Has had at least 1 hospitalization in the last year in [place].  Was treated with antipsychotics and antidepressants.  Eventually responded to ECT.  Had good response to ECT and was on a maintenance schedule but when he relocated to [facility] was not continued.  He has continued treatment with Invega and Zoloft but has become more and more catatonic.  No past history reported of suicide attempts or violence.  Is the patient at risk to self? Yes.    Has the patient been a risk to self in the past 6 months? Yes.    Has the patient been a risk to self within the distant past? Yes.    Is the patient a risk to others? No.  Has the patient been a risk to others in the past 6 months? No.  Has the patient been a risk to others within the distant past? No.   Prior Inpatient Therapy:   Prior Outpatient Therapy:    Alcohol Screening:   Substance Abuse History in the last 12 months:  No. Consequences of Substance  Abuse: Negative Previous Psychotropic Medications: Yes  Psychological Evaluations: Yes  Past Medical History:  Past Medical History:  Diagnosis Date  . Bipolar 1 disorder (HCC)   . 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 GERD (gastroesophageal reflux disease)   . Nonverbal   . Schizoaffective disorder (HCC)   . Tardive dyskinesia    No past surgical history on file. Family History:   Family History  Adopted: Yes  Family history unknown: Yes   Family Psychiatric  History: No reported family history Tobacco Screening:   Social History:  Social History   Substance and Sexual Activity  Alcohol Use Never  . Frequency: Never     Social History   Substance and Sexual Activity  Drug Use Never    Additional Social History:                           Allergies:  No Known Allergies Lab Results: No results found for this or any previous visit (from the past 48 hour(s)).  Blood Alcohol level:  Lab Results  Component Value Date   ETH <10 01/24/2019    Metabolic Disorder Labs:  No results found for: HGBA1C, MPG No results found for: PROLACTIN No results found for: CHOL, TRIG, HDL, CHOLHDL, VLDL, LDLCALC  Current Medications: Current Facility-Administered Medications  Medication Dose Route Frequency Provider Last Rate Last Dose  . acetaminophen (TYLENOL) tablet 650 mg  650 mg Oral Q6H PRN Clapacs, John T, MD      . alum & mag hydroxide-simeth (MAALOX/MYLANTA) 200-200-20 MG/5ML suspension 30 mL  30 mL Oral Q4H PRN Clapacs, John T, MD      . LORazepam (ATIVAN) tablet 1 mg  1 mg Oral Q6H Clapacs, John T, MD      . magnesium hydroxide (MILK OF MAGNESIA) suspension 30 mL  30 mL Oral Daily PRN Clapacs, John T, MD      . pantoprazole (PROTONIX) EC tablet 40 mg  40 mg Oral Daily Clapacs, John T, MD      . polyethylene glycol (MIRALAX / GLYCOLAX) packet 17 g  17 g Oral Daily Clapacs, John T, MD      . traZODone (DESYREL) tablet 100 mg  100 mg Oral QHS Clapacs, John T, MD      . vitamin B-12 (CYANOCOBALAMIN) tablet 1,000 mcg  1,000 mcg Oral Daily Clapacs, John T, MD       PTA Medications: Medications Prior to Admission  Medication Sig Dispense Refill Last Dose  . atenolol (TENORMIN) 25 MG tablet Take 1 tablet (25 mg total) by mouth daily. 90 tablet 0 01/23/2019 at 0800  . benztropine (COGENTIN) 1 MG tablet Take 1 tablet (1 mg total) by mouth 2 (two) times  daily for 7 days. 14 tablet 0 01/23/2019 at 1730  . busPIRone (BUSPAR) 5 MG tablet Take 1 tablet (5 mg total) by mouth 2 (two) times daily. (Patient taking differently: Take 5 mg by mouth 2 (two) times daily as needed. ) 60 tablet 0 01/23/2019 at 1330  . Cyanocobalamin (VITAMIN B 12) 500 MCG TABS Take 500 mcg by mouth daily. 90 tablet 0 01/23/2019 at 1330  . LORazepam (ATIVAN) 0.5 MG tablet Take 1 tablet (0.5 mg total) by mouth every 8 (eight) hours as needed for anxiety. 90 tablet 2 01/23/2019 at 2000  . omeprazole (PRILOSEC) 40 MG capsule Take 1 capsule (40 mg total) by mouth daily. 90 capsule 0 01/23/2019 at 0800  . paliperidone (INVEGA) 6 MG  24 hr tablet Take 1 tablet (6 mg total) by mouth daily. 90 tablet 0 01/23/2019 at 0800  . polyethylene glycol (MIRALAX) 17 g packet Take 17 g by mouth daily as needed for moderate constipation. 72 packet 0   . sertraline (ZOLOFT) 50 MG tablet Take 3 tablets (150 mg total) by mouth daily. 270 tablet 0 01/23/2019 at 0800  . traZODone (DESYREL) 50 MG tablet Take 1 tablet (50 mg total) by mouth at bedtime. 90 tablet 0 01/23/2019 at 2000  . Valbenazine Tosylate (INGREZZA) 40 & 80 MG CPPK Take 40 mg by mouth daily for 7 days, THEN 80 mg daily for 30 days. (Patient not taking: Reported on 01/24/2019) 30 each 0 Not Taking at Unknown time    Musculoskeletal: Strength & Muscle Tone: decreased and atrophy Gait & Station: unsteady Patient leans: Front  Psychiatric Specialty Exam: Physical Exam  Nursing note and vitals reviewed. Constitutional: He appears well-developed and well-nourished.  HENT:  Head: Normocephalic and atraumatic.  Eyes: Pupils are equal, round, and reactive to light. Conjunctivae are normal.  Neck: Normal range of motion.  Cardiovascular: Regular rhythm and normal heart sounds.  Respiratory: Effort normal. No respiratory distress.  GI: Soft.  Musculoskeletal: Normal range of motion.  Neurological: He is alert.  Patient is very difficult to  examine.  Attempts to move the arm elicit both ratcheting and clear resistance with some degree of catatonic flexibility.  Patient is reported to be able to stand but is unable to cooperate with an examination around that.  He appears to be alert but does not respond verbally.  Skin: Skin is warm and dry.  Psychiatric: His affect is blunt. He is withdrawn. He is noncommunicative.    Review of Systems  Unable to perform ROS: Psychiatric disorder    There were no vitals taken for this visit.There is no height or weight on file to calculate BMI.  General Appearance: Casual  Eye Contact:  Minimal  Speech:  Negative  Volume:  Decreased  Mood:  Negative  Affect:  Flat  Thought Process:  NA  Orientation:  Negative  Thought Content:  Negative  Suicidal Thoughts:  No  Homicidal Thoughts:  No  Memory:  Negative  Judgement:  Negative  Insight:  Negative  Psychomotor Activity:  Decreased  Concentration:  Concentration: Negative  Recall:  Negative  Fund of Knowledge:  Negative  Language:  Negative  Akathisia:  Negative  Handed:  Right  AIMS (if indicated):     Assets:  Financial Resources/Insurance Housing Physical Health Social Support  ADL's:  Impaired  Cognition:  Impaired,  Severe  Sleep:       Treatment Plan Summary: Daily contact with patient to assess and evaluate symptoms and progress in treatment, Medication management and Plan Admitted to psychiatric service because of dangerous to self in the form of anorexia potential starvation.  Showing signs already of wasting away and atrophy.  Family increasingly unable to care for him.  Patient has a history of good response to ECT and is expected to respond well.  Family are already expressing willingness to sign consent and have the power of attorney.  ECT will be initiated on Wednesday.  Until then I am holding off on the antipsychotic as the patient remains stiff and there has been some concern about possible dystonia.  I am going to  put him on a larger dose of standing Ativan to see if that helps with some of these catatonic features.  We will try and  get him fed and add extra nutrition.  Labs will be reviewed.  Family agreeable to plan.  We will get a coronavirus test here to substitute for the one in the emergency room that would normally be obtained prior to admission.  Observation Level/Precautions:  1 to 1  Laboratory:  EKG  Psychotherapy:    Medications:    Consultations:    Discharge Concerns:    Estimated LOS:  Other:     Physician Treatment Plan for Primary Diagnosis: Schizoaffective disorder, depressive type (HCC) Long Term Goal(s): Improvement in symptoms so as ready for discharge  Short Term Goals: Ability to verbalize feelings will improve and Compliance with prescribed medications will improve  Physician Treatment Plan for Secondary Diagnosis: Principal Problem:   Schizoaffective disorder, depressive type (HCC) Active Problems:   Catatonia   Mild malnutrition (HCC)  Long Term Goal(s): Improvement in symptoms so as ready for discharge  Short Term Goals: Ability to maintain clinical measurements within normal limits will improve  I certify that inpatient services furnished can reasonably be expected to improve the patient's condition.    Mordecai Rasmussen, MD 5/18/20203:43 PM

## 2019-01-29 NOTE — BHH Suicide Risk Assessment (Signed)
[facility] Admission Suicide Risk Assessment   Nursing information obtained from:  Review of record, Other (Comment)(per dr Donterius Filley) Demographic factors:  Male, Caucasian, Low socioeconomic status, Unemployed Current Mental Status:  NA Loss Factors:  Decrease in vocational status, Financial problems / change in socioeconomic status, Decline in physical health Historical Factors:  NA Risk Reduction Factors:  Positive social support, Living with another person, especially a relative  Total Time spent with patient: 1 hour Principal Problem: Schizoaffective disorder, depressive type (HCC) Diagnosis:  Principal Problem:   Schizoaffective disorder, depressive type (HCC) Active Problems:   Catatonia   Mild malnutrition (HCC)  Subjective Data: Patient seen chart reviewed.  See intake note.  48 year old man with schizoaffective disorder presents with catatonia profound withdrawal.  Patient is nonverbal currently does not answer any questions.  Has not been agitated or aggressive.  No indication of any signs of self injury.  Continued Clinical Symptoms:    The "Alcohol Use Disorders Identification Test", Guidelines for Use in Primary Care, Second Edition.  World Science writer [facility]). Score between 0-7:  no or low risk or alcohol related problems. Score between 8-15:  moderate risk of alcohol related problems. Score between 16-19:  high risk of alcohol related problems. Score 20 or above:  warrants further diagnostic evaluation for alcohol dependence and treatment.   CLINICAL FACTORS:   Depression:   Severe Schizophrenia:   Paranoid or undifferentiated type   Musculoskeletal: Strength & Muscle Tone: decreased and atrophy Gait & Station: unsteady Patient leans: Front  Psychiatric Specialty Exam: Physical Exam  Nursing note and vitals reviewed. Constitutional: He appears well-developed.  HENT:  Head: Normocephalic and atraumatic.  Eyes: Pupils are equal, round, and reactive to light.  Conjunctivae are normal.  Neck: Normal range of motion.  Cardiovascular: Regular rhythm and normal heart sounds.  Respiratory: Effort normal.  GI: Soft.  Musculoskeletal: Normal range of motion.  Neurological: He exhibits abnormal muscle tone.  Skin: Skin is warm and dry.  Psychiatric: His affect is blunt. He is noncommunicative.    Review of Systems  Unable to perform ROS: Psychiatric disorder    There were no vitals taken for this visit.There is no height or weight on file to calculate BMI.  General Appearance: Disheveled  Eye Contact:  None  Speech:  Slow  Volume:  Decreased  Mood:  Negative  Affect:  Flat  Thought Process:  Disorganized  Orientation:  Negative  Thought Content:  Negative  Suicidal Thoughts:  No  Homicidal Thoughts:  No  Memory:  Negative  Judgement:  Negative  Insight:  Negative  Psychomotor Activity:  Decreased  Concentration:  Concentration: Poor  Recall:  Negative  Fund of Knowledge:  Negative  Language:  Negative  Akathisia:  Negative  Handed:  Right  AIMS (if indicated):     Assets:  Social Support  ADL's:  Impaired  Cognition:  Impaired,  Severe  Sleep:         COGNITIVE FEATURES THAT CONTRIBUTE TO RISK:  Loss of executive function    SUICIDE RISK:   Mild:  Suicidal ideation of limited frequency, intensity, duration, and specificity.  There are no identifiable plans, no associated intent, mild dysphoria and related symptoms, good self-control (both objective and subjective assessment), few other risk factors, and identifiable protective factors, including available and accessible social support.  PLAN OF CARE: Patient has mild suicide risk at least due to diagnosis and presence of psychotic depression although he does not appear to have a past history of suicide  attempts and there is no indication that in the past few weeks he has made any efforts or statements to actively try to harm himself.  Withdrawn and profoundly depressed however and  will have a one-to-one for safety.  I certify that inpatient services furnished can reasonably be expected to improve the patient's condition.   Mordecai RasmussenJohn Lawana Hartzell, MD 01/29/2019, 4:16 PM

## 2019-01-29 NOTE — Progress Notes (Signed)
As a patient of Dr. Toni Amend, pt directly admitted to the unit due to catatonia and declining ability to perform ADL's independently. Report given to this writer per Dr. Toni Amend. For safety reasons, patient was placed on 1:1 with a sitter. ED techs that brought patient to the unit performed a Covid-19 test, results were negative. Upon patient's arrival to the unit a search for contraband was performed and unsafe items removed from his room. Search witnessed by Lanice Shirts, RN. No contraband found on patient nor his belongings. Patient is non-verbal but will attempt to speak at times, tone is mostly inaudible. Meal tray came and patient nibbled on a few items but mostly played over his fries. Patient took his medications mixed with applesauce and will drink his liquids better through a straw. Milieu remains safe with Recruitment consultant.

## 2019-01-29 NOTE — Tx Team (Signed)
Initial Treatment Plan 01/29/2019 5:36 PM Jetty Duhamel TDD:[id number]    PATIENT STRESSORS: Financial difficulties Health problems   PATIENT STRENGTHS: Motivation for treatment/growth Supportive family/friends   PATIENT IDENTIFIED PROBLEMS: Catatonia  Chronic Mental Illness                   DISCHARGE CRITERIA:  Adequate post-discharge living arrangements Improved stabilization in mood, thinking, and/or behavior  PRELIMINARY DISCHARGE PLAN: Outpatient therapy Return to previous living arrangement  PATIENT/FAMILY INVOLVEMENT: This treatment plan has been presented to and reviewed with the patient, Troy Fox, and/or family member.  The patient and family have been given the opportunity to ask questions and make suggestions.  Jim Desanctis Mattew Chriswell, RN 01/29/2019, 5:36 PM

## 2019-01-30 ENCOUNTER — Other Ambulatory Visit: Payer: Self-pay | Admitting: Psychiatry

## 2019-01-30 DIAGNOSIS — F251 Schizoaffective disorder, depressive type: Principal | ICD-10-CM

## 2019-01-30 LAB — HEMOGLOBIN A1C
Hgb A1c MFr Bld: 4.9 % (ref 4.8–5.6)
Mean Plasma Glucose: 93.93 mg/dL

## 2019-01-30 LAB — TSH: TSH: 1.193 u[IU]/mL (ref 0.350–4.500)

## 2019-01-30 LAB — VITAMIN B12: Vitamin B-12: 834 pg/mL (ref 180–914)

## 2019-01-30 MED ORDER — LORAZEPAM 2 MG/ML IJ SOLN
1.0000 mg | Freq: Four times a day (QID) | INTRAMUSCULAR | Status: DC
  Administered 2019-01-30 – 2019-01-31 (×4): 1 mg via INTRAMUSCULAR
  Filled 2019-01-30 (×4): qty 1

## 2019-01-30 NOTE — Progress Notes (Signed)
1:1 observation note:  Patient remains 1:1 due to safety concerns.  He did get out of bed stating "I've got to use the bathroom."  Patient was dribbling urine on the way to the bathroom.  Patient was cleaned and his clothes were changed due to urine stains.  He keeps stating that he is "going to prison."  Patient's skin is unremarkable; no breakdown or redness noted.  He was placed in an adult diaper due to incontinence.  Patient's skin is to be assessed every shift or every change due to incontinence.  Patient is to be NPO after midnight due to ECT treatment in the am.  Patient's father will arrive in the am to sign consent for ECT.  Will continue to monitor.  Patient needs two assist while ambulating due to high fall risk.

## 2019-01-30 NOTE — Progress Notes (Signed)
Patient is assisted to the bathroom for ADLs , voided , and bottom cleaned with water and soap and padded dry, skin is intact no redness and not broken ,  barrier cream applied, patient was returned to bed with assist, 1&1 remain in place.no distress.

## 2019-01-30 NOTE — Progress Notes (Signed)
1:1 note D:1900-Patient is sitting in semi-reclined position on bed with feet on floor. Resists any attempt by staff to position him more comfortably, and returns to previous position. Will not speak. 2000-Patient verbalized "I got myself in a mess" and "I have trouble shaving". Speech is difficult to understand. Continues in semi-reclined position on bed with feet on the floor. Took some fluids through a straw. 2100-Unable to assess whether patient is suicidal or homicidal or experiencing hallucinations. Will not respond when asked these questions.Took medications crushed in applesauce and some water through a straw. 2200-Continues in same position on bed with feet on floor. Continues to display some waxy flexibility and resists attempts by staff to assist him to lie on the bed.  2300-Continues to be nonverbal and holding in same position with feet on floor and upper body on bed. 0000-Continues to be nonverbal and holding in same position with feet on floor and upper body on bed.  A: Continue 1:1 observation for safety. R: Safety maintained.

## 2019-01-30 NOTE — BHH Counselor (Signed)
CSW unable to complete PSA due to pt presenting in a catatonic state. During today's progression meeting, physician reports the pt moved from [facility] in January 2020 and has not received ECT since then. Physician reports pt is admitted to the unit to receive ECT, will begin on 5/20.

## 2019-01-30 NOTE — Progress Notes (Signed)
Recreation Therapy Notes  INPATIENT RECREATION THERAPY ASSESSMENT  Patient Details Name: Troy Fox MRN: [id number] DOB: 1971-03-03 Today's Date: 01/30/2019       Information Obtained From: (Patient unable top complete assessment at this time)  Able to Participate in Assessment/Interview:    Patient Presentation:    Reason for Admission (Per Patient):    Patient Stressors:    Coping Skills:      Leisure Interests (2+):     Frequency of Recreation/Participation:    Awareness of Community Resources:     [employer]:     Current Use:    If no, Barriers?:    Expressed Interest in [employer] Information:    [place] of Residence:     Patient Main Form of Transportation:    Patient Strengths:     Patient Identified Areas of Improvement:     Patient Goal for Hospitalization:     Current SI (including self-harm):     Current HI:     Current AVH:    Staff Intervention Plan:    Consent to Intern Participation:    Jaimen Melone 01/30/2019, 12:05 PM

## 2019-01-30 NOTE — Progress Notes (Signed)
Recreation Therapy Notes  Date: 01/30/2019  Time: 9:30 am   Location: Craft room   Behavioral response: N/A   Intervention Topic: Self-esteem  Discussion/Intervention: Patient did not attend group.   Clinical Observations/Feedback:  Patient did not attend group.   Dhwani Venkatesh LRT/CTRS        Connelly Spruell 01/30/2019 10:58 AM

## 2019-01-30 NOTE — Progress Notes (Signed)
[facility] MD Progress Note  01/30/2019 11:00 AM Troy Fox  MRN:  [id number] Subjective: Patient seen and chart reviewed.  See intake note from yesterday.  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  Patient seen this morning.  He is in his room with a one-to-one sitter.  Sits on the bed almost completely immobile.  During the time I was there he attempted to speak briefly but was largely unintelligible.  The most I could understand was that he was talking about prison and how he did not want to go there.  I tried to reassure him that that had nothing to do with any of his condition there was no prison involved but of course I made no impression on his depressive delusion.  Nursing reports he ate a small amount with encouragement this morning.  Was able to take only a small amount of his medication as well.  Spoke with father.  Father has medical power of attorney and continues to be strongly in favor of electroconvulsive therapy to start tomorrow. Principal Problem: Schizoaffective disorder, depressive type (HCC) Diagnosis: Principal Problem:   Schizoaffective disorder, depressive type (HCC) Active Problems:   Catatonia   Mild malnutrition (HCC)  Total Time spent with patient: 30 minutes  Past Psychiatric History: Patient has a lifelong history of chronic mental illness most likely schizoaffective disorder with this episode of psychotic depression probably going on as much as a year or more.  Past Medical History:  Past Medical History:  Diagnosis Date  . Bipolar 1 disorder (HCC)   . 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 GERD (gastroesophageal reflux disease)   . Nonverbal   . Schizoaffective disorder (HCC)   . Tardive dyskinesia    History reviewed. No pertinent surgical history. Family History:  Family History  Adopted: Yes  Family history unknown: Yes   Family Psychiatric  History: None reported Social  History:  Social History   Substance and Sexual Activity  Alcohol Use Never  . Frequency: Never     Social History   Substance and Sexual Activity  Drug Use Never    Social History   Socioeconomic History  . Marital status: Single    Spouse name: Not on file  . Number of children: Not on file  . Years of education: Not on file  . Highest education level: Not on file  Occupational History  . Not on file  Social Needs  . Financial resource strain: Not on file  . Food insecurity:    Worry: Not on file    Inability: Not on file  . Transportation needs:    Medical: Not on file    Non-medical: Not on file  Tobacco Use  . Smoking status: Never Smoker  . Smokeless tobacco: Never Used  Substance and Sexual Activity  . Alcohol use: Never    Frequency: Never  . Drug use: Never  . Sexual activity: Not Currently  Lifestyle  . Physical activity:    Days per week: Not on file    Minutes per session: Not on file  . Stress: Not on file  Relationships  . Social connections:    Talks on phone: Not on file    Gets together: Not on file    Attends religious service: Not on file    Active member of club or organization: Not on file    Attends meetings of clubs or organizations: Not on file  Relationship status: Not on file  Other Topics Concern  . Not on file  Social History Narrative  . Not on file   Additional Social History:    Pain Medications: please see mar Prescriptions: please see mar Over the Counter: please see mar History of alcohol / drug use?: No history of alcohol / drug abuse Longest period of sobriety (when/how long): NA                    Sleep: Fair  Appetite:  Poor  Current Medications: Current Facility-Administered Medications  Medication Dose Route Frequency Provider Last Rate Last Dose  . acetaminophen (TYLENOL) tablet 650 mg  650 mg Oral Q6H PRN Clapacs, John T, MD      . alum & mag hydroxide-simeth (MAALOX/MYLANTA) 200-200-20 MG/5ML  suspension 30 mL  30 mL Oral Q4H PRN Clapacs, John T, MD      . LORazepam (ATIVAN) injection 1 mg  1 mg Intramuscular Q6H Clapacs, John T, MD   1 mg at 01/30/19 1014  . magnesium hydroxide (MILK OF MAGNESIA) suspension 30 mL  30 mL Oral Daily PRN Clapacs, John T, MD      . pantoprazole (PROTONIX) EC tablet 40 mg  40 mg Oral Daily Clapacs, Jackquline Denmark, MD   40 mg at 01/29/19 1813  . polyethylene glycol (MIRALAX / GLYCOLAX) packet 17 g  17 g Oral Daily Clapacs, Jackquline Denmark, MD   17 g at 01/30/19 7741  . traZODone (DESYREL) tablet 100 mg  100 mg Oral QHS Clapacs, Jackquline Denmark, MD   100 mg at 01/29/19 2129  . vitamin B-12 (CYANOCOBALAMIN) tablet 1,000 mcg  1,000 mcg Oral Daily Clapacs, Jackquline Denmark, MD   1,000 mcg at 01/29/19 1813    Lab Results:  Results for orders placed or performed during the hospital encounter of 01/29/19 (from the past 48 hour(s))  SARS Coronavirus 2 (CEPHEID - Performed in [facility] hospital lab), Hosp Order     Status: None   Collection Time: 01/29/19  3:41 PM  Result Value Ref Range   SARS Coronavirus 2 NEGATIVE NEGATIVE    Comment: (NOTE) If result is NEGATIVE SARS-CoV-2 target nucleic acids are NOT DETECTED. The SARS-CoV-2 RNA is generally detectable in upper and lower  respiratory specimens during the acute phase of infection. The lowest  concentration of SARS-CoV-2 viral copies this assay can detect is 250  copies / mL. A negative result does not preclude SARS-CoV-2 infection  and should not be used as the sole basis for treatment or other  patient management decisions.  A negative result may occur with  improper specimen collection / handling, submission of specimen other  than nasopharyngeal swab, presence of viral mutation(s) within the  areas targeted by this assay, and inadequate number of viral copies  (<250 copies / mL). A negative result must be combined with clinical  observations, patient history, and epidemiological information. If result is POSITIVE SARS-CoV-2 target  nucleic acids are DETECTED. The SARS-CoV-2 RNA is generally detectable in upper and lower  respiratory specimens dur ing the acute phase of infection.  Positive  results are indicative of active infection with SARS-CoV-2.  Clinical  correlation with patient history and other diagnostic information is  necessary to determine patient infection status.  Positive results do  not rule out bacterial infection or co-infection with other viruses. If result is PRESUMPTIVE POSTIVE SARS-CoV-2 nucleic acids MAY BE PRESENT.   A presumptive positive result was obtained on the submitted specimen  and confirmed on repeat testing.  While 2019 novel coronavirus  (SARS-CoV-2) nucleic acids may be present in the submitted sample  additional confirmatory testing may be necessary for epidemiological  and / or clinical management purposes  to differentiate between  SARS-CoV-2 and other Sarbecovirus currently known to infect humans.  If clinically indicated additional testing with an alternate test  methodology [phone]([id number]) is advised. The SARS-CoV-2 RNA is generally  detectable in upper and lower respiratory sp ecimens during the acute  phase of infection. The expected result is Negative. Fact Sheet for Patients:  BoilerBrush.com.cyhttps://www.fda.gov/media/136312/download Fact Sheet for Healthcare Providers: https://pope.com/https://www.fda.gov/media/136313/download This test is not yet approved or cleared by the [place] FDA and has been authorized for detection and/or diagnosis of SARS-CoV-2 by FDA under an Emergency Use Authorization (EUA).  This EUA will remain in effect (meaning this test can be used) for the duration of the COVID-19 declaration under Section 564(b)(1) of the Act, 21 U.S.C. section 360bbb-3(b)(1), unless the authorization is terminated or revoked sooner. Performed at [facility], [address] [place], [place] [address]   TSH     Status: None   Collection Time: 01/30/19  6:47 AM  Result Value Ref  Range   TSH 1.193 0.350 - 4.500 uIU/mL    Comment: Performed by a 3rd Generation assay with a functional sensitivity of <=0.01 uIU/mL. Performed at [facility], [address] [place], [place] [address]     Blood Alcohol level:  Lab Results  Component Value Date   [facility]10 01/24/2019    Metabolic Disorder Labs: No results found for: HGBA1C, MPG No results found for: PROLACTIN No results found for: CHOL, TRIG, HDL, CHOLHDL, VLDL, LDLCALC  Physical Findings: AIMS: Facial and Oral Movements Muscles of Facial Expression: None, normal Lips and Perioral Area: None, normal Jaw: None, normal Tongue: None, normal,Extremity Movements Upper (arms, wrists, hands, fingers): None, normal Lower (legs, knees, ankles, toes): None, normal, Trunk Movements Neck, shoulders, hips: None, normal, Overall Severity Severity of abnormal movements (highest score from questions above): None, normal Incapacitation due to abnormal movements: None, normal Patient's awareness of abnormal movements (rate only patient's report): No Awareness, Dental Status Current problems with teeth and/or dentures?: No Does patient usually wear dentures?: No  CIWA:    COWS:     Musculoskeletal: Strength & Muscle Tone: decreased and atrophy Gait & Station: unable to stand Patient leans: N/A  Psychiatric Specialty Exam: Physical Exam  Nursing note and vitals reviewed. Constitutional: He appears well-developed.  HENT:  Head: Normocephalic and atraumatic.  Eyes: Pupils are equal, round, and reactive to light. Conjunctivae are normal.  Neck: Normal range of motion.  Cardiovascular: Regular rhythm and normal heart sounds.  Respiratory: Effort normal. No respiratory distress.  GI: Soft.  Musculoskeletal: Normal range of motion.  Neurological: He is alert.  Skin: Skin is warm and dry.  Psychiatric: His affect is blunt. He is not agitated and not aggressive. Thought content is delusional. Cognition and memory  are impaired. He expresses inappropriate judgment. He is noncommunicative.    Review of Systems  Unable to perform ROS: Psychiatric disorder    Blood pressure 125/80, pulse 90, temperature 98.6 F (37 C), temperature source Oral, resp. rate 16, height 5\' 5"  (1.651 m), weight 63.5 kg, SpO2 97 %.Body mass index is 23.3 kg/m.  General Appearance: Disheveled  Eye Contact:  Minimal  Speech:  Garbled and Slurred  Volume:  Decreased  Mood:  Negative  Affect:  Flat  Thought Process:  NA  Orientation:  Negative  Thought Content:  Delusions  Suicidal Thoughts:  No  Homicidal Thoughts:  No  Memory:  Negative  Judgement:  Negative  Insight:  Negative  Psychomotor Activity:  Negative  Concentration:  Concentration: Negative  Recall:  Negative  Fund of Knowledge:  Negative  Language:  Negative  Akathisia:  Negative  Handed:  Right  AIMS (if indicated):     Assets:  Health and safety inspector Housing Physical Health Resilience Social Support  ADL's:  Impaired  Cognition:  Impaired,  Moderate  Sleep:  Number of Hours: 0     Treatment Plan Summary: Daily contact with patient to assess and evaluate symptoms and progress in treatment, Medication management and Plan Patient remains catatonic and largely without ability to communicate.  Plan is for electroconvulsive therapy bilateral to begin tomorrow morning.  Father will meet [place] for consent at 845.  Nursing is aware.  I have changed the orders of his Ativan to make it IM because I think it would be really useful to get that into him on a regular basis and see if it breaks through some of his catatonia so that he will eat better.  Otherwise the psychiatric medicine is largely irrelevant while we wait for initiating ECT.  Continue with one-to-one.  Tried to offer reassurance and education and review of treatment plan to the patient.  Mordecai Rasmussen, MD 01/30/2019, 11:00 AM

## 2019-01-30 NOTE — Progress Notes (Signed)
1:1 note 0100-0700 D: Patient has been in same semi-reclining position in bed. Took ativan in applesauce at 3:15am. Continues with waxy flexibility. Verbalizes occasionally but says "I got myself in a bind" or "I need help shaving" and will not answer when asked if he is suicidal or homicidal. Taking a small amount of fluids with a straw. Has not eaten anything. Did not sleep all night. A: Continue 1:1 for safety. R: Safety maintained.

## 2019-01-30 NOTE — Progress Notes (Signed)
Patient is continued on 1&1 for safety reasons, patient is in bed not sleeping, refuse to respond to assessment questions like depression/anxiety, and si/hi, avh, patient is in a catatonic state, appear calm in bed, observed patient eat about 50% of meal presented to him, took his pm meds. With out any issues .

## 2019-01-30 NOTE — Progress Notes (Signed)
D: Patient continues to refuse to eat.  He drank a few sips of water this morning and took a little of his medications in applesauce.  He did not take enough to count as given, however.  Informed Dr. Toni Amend of same and injection of 1 mg ativan given with good results.  Patient continues to be disorganized with his speech.  He states, "I'm going to prison tomorrow because I've done something wrong."  He states that a person walked by his room "with a little kid."  Patient lies on the side of the bed with his feel planted on the floor.  The head of his bed was raised for comfort.  He denies any thoughts of self harm.  He is wearing a depends and is incontinent of urine.  Informed asst. Dir that we needed some supplies for him, as the depends he is wearing is probably the one that he came in with.    A: Continue to monitor medication management and MD orders.  Patient will remain a 1:1 due to safety issues.  Offer support and encouragement as needed.  R: Patient is receptive to staff; patient does not need a lot of redirection.  He remains somewhat catatonic, however, he will speak at times.

## 2019-01-30 NOTE — Plan of Care (Signed)
  Problem: Activity: Goal: Interest or engagement in activities will improve Outcome: Not Progressing Goal: Sleeping patterns will improve Outcome: Not Progressing   D: Patient continues to sit in a semi-reclined position on the bed with his feet on the floor, with waxy flexibility. Resists any effort by staff to position him more comfortably. Has remained in same position with little to no change all shift. Accepting oral medication crushed up in applesauce. Will drink some water through a straw. Only verbalization he has made that was understandable was "I have trouble shaving". Unable to assess whether patient is having suicidal or homicidal thoughts or hallucinations, because he does not respond when asked these questions.  A: Continue 1:1 for safety. R: Safety maintained.

## 2019-01-30 NOTE — [facility] Group Notes (Signed)
Feelings Around Diagnosis 01/30/2019 1PM  Type of Therapy/Topic:  Group Therapy:  Feelings about Diagnosis  Participation Level:  Did Not Attend   Description of Group:   This group will allow patients to explore their thoughts and feelings about diagnoses they have received. Patients will be guided to explore their level of understanding and acceptance of these diagnoses. Facilitator will encourage patients to process their thoughts and feelings about the reactions of others to their diagnosis and will guide patients in identifying ways to discuss their diagnosis with significant others in their lives. This group will be process-oriented, with patients participating in exploration of their own experiences, giving and receiving support, and processing challenge from other group members.   Therapeutic Goals: 1. Patient will demonstrate understanding of diagnosis as evidenced by identifying two or more symptoms of the disorder 2. Patient will be able to express two feelings regarding the diagnosis 3. Patient will demonstrate their ability to communicate their needs through discussion and/or role play  Summary of Patient Progress:       Therapeutic Modalities:   Cognitive Behavioral Therapy Brief Therapy Feelings Identification    Tymothy Cass T Lorrin Nawrot, LCSW 01/30/2019 1:58 PM  

## 2019-01-30 NOTE — Progress Notes (Signed)
1:1 observation note:  Patient remains 1:1 due safety concerns.  Patient has been able to ambulate to bathroom with 1 assist.  He is able to voice his request to go to the bathroom.  He has adult diapers if needed for incontinence.  Patient has no skin breakdown, no redness or irritation.  Skin appears unremarkable.  Patient has also ate a brownie and ate a bowl of soup for dinner.  He appears to be improving a little.  He is to remain NPO after midnight due ECT treatment in am.

## 2019-01-31 ENCOUNTER — Encounter: Payer: Self-pay | Admitting: Anesthesiology

## 2019-01-31 ENCOUNTER — Inpatient Hospital Stay: Payer: Medicare Other | Admitting: Anesthesiology

## 2019-01-31 MED ORDER — SODIUM CHLORIDE 0.9 % IV SOLN: 500.0000 mL | Freq: Once | INTRAVENOUS | Status: DC

## 2019-01-31 MED ORDER — ONDANSETRON HCL 4 MG/2ML IJ SOLN: 4.0000 mg | Freq: Once | INTRAMUSCULAR | Status: DC | PRN

## 2019-01-31 MED ORDER — METHOHEXITAL SODIUM 0.5 G IJ SOLR
INTRAMUSCULAR | Status: AC
  Filled 2019-01-31: qty 500

## 2019-01-31 MED ORDER — METHOHEXITAL SODIUM 100 MG/10ML IV SOSY
PREFILLED_SYRINGE | INTRAVENOUS | Status: DC | PRN
  Administered 2019-01-31: 70 mg via INTRAVENOUS

## 2019-01-31 MED ORDER — LACTATED RINGERS IV SOLN
INTRAVENOUS | Status: DC
  Administered 2019-01-31: 10:00:00 via INTRAVENOUS

## 2019-01-31 MED ORDER — FENTANYL CITRATE (PF) 100 MCG/2ML IJ SOLN: 25.0000 ug | INTRAMUSCULAR | Status: DC | PRN

## 2019-01-31 MED ORDER — SUCCINYLCHOLINE CHLORIDE 20 MG/ML IJ SOLN
INTRAMUSCULAR | Status: DC | PRN
  Administered 2019-01-31: 90 mg via INTRAVENOUS

## 2019-01-31 NOTE — Anesthesia Post-op Follow-up Note (Signed)
Anesthesia QCDR form completed.        

## 2019-01-31 NOTE — Anesthesia Postprocedure Evaluation (Signed)
Anesthesia Post Note  Patient: Troy Fox  Procedure(s) Performed: ECT TX  Patient location during evaluation: PACU Anesthesia Type: General Level of consciousness: awake and alert and oriented Pain management: pain level controlled Vital Signs Assessment: post-procedure vital signs reviewed and stable Respiratory status: spontaneous breathing Cardiovascular status: blood pressure returned to baseline Anesthetic complications: no     Last Vitals:  Vitals:   01/31/19 1109 01/31/19 1119  BP: 114/69 113/70  Pulse: 88 81  Resp: 15 15  Temp: 36.9 C   SpO2: 94% 95%    Last Pain:  Vitals:   01/31/19 1119  TempSrc:   PainSc: 0-No pain                 Cielo Arias

## 2019-01-31 NOTE — Transfer of Care (Signed)
Immediate Anesthesia Transfer of Care Note  Patient: Troy Fox  Procedure(s) Performed: ECT TX  Patient Location: PACU  Anesthesia Type:General  Level of Consciousness: unresponsive  Airway & Oxygen Therapy: Patient Spontanous Breathing and Patient connected to face mask oxygen  Post-op Assessment: Report given to RN and Post -op Vital signs reviewed and stable  Post vital signs: Reviewed and stable  Last Vitals:  Vitals Value Taken Time  BP 118/68 01/31/2019 10:49 AM  Temp 36.9 C 01/31/2019 10:49 AM  Pulse 97 01/31/2019 10:50 AM  Resp 17 01/31/2019 10:50 AM  SpO2 100 % 01/31/2019 10:50 AM  Vitals shown include unvalidated device data.  Last Pain:  Vitals:   01/31/19 1049  TempSrc: Temporal  PainSc:          Complications: No apparent anesthesia complications  Pt catatonic preop.

## 2019-01-31 NOTE — Progress Notes (Signed)
Patient remains on a 1:1 for safety. Patient did go to the day room for snack for about an hour at 9:00 pm. Patient took his medications with no issues. Patient presents with a better affect than he did on Monday. Patient was able to speak more and swallow snack and medications.  Patient remains safe on the unit

## 2019-01-31 NOTE — BHH Counselor (Signed)
CSW unable to complete PSA due to the pt presenting in a catatonic state, unable to provide any history. The pt is scheduled to receive ECT today.

## 2019-01-31 NOTE — Progress Notes (Signed)
Ativan is not given at this time due to patient is at sleep relaxed and comfortable no distress and with unsteady gait.

## 2019-01-31 NOTE — Anesthesia Procedure Notes (Signed)
Procedure Name: General with mask airway Date/Time: 01/31/2019 10:39 AM Performed by: Ginger Carne, CRNA Pre-anesthesia Checklist: Patient identified, Emergency Drugs available, Suction available and Patient being monitored Patient Re-evaluated:Patient Re-evaluated prior to induction Oxygen Delivery Method: Circle system utilized Preoxygenation: Pre-oxygenation with 100% oxygen Induction Type: IV induction Ventilation: Mask ventilation without difficulty and Mask ventilation throughout procedure Airway Equipment and Method: Bite block Placement Confirmation: positive ETCO2 Dental Injury: Teeth and Oropharynx as per pre-operative assessment

## 2019-01-31 NOTE — Progress Notes (Addendum)
3-7 3;00 Patient remained  In bed  1:1 at bedside  4:00 Patient checked for incontinence  Remained dry  Barrier cream applied to buttocks  And between legs.  Patient escorted vis chair to dayroom for dinner  Appetite fair only ate fries  and banana pudding And 2 bites of burger . 50% of  Meal . Patient returned to room . Able to stand  For barrier cream  To buttocks and between legs . Patient turned to left side  1800 Patient checked  For incontinence, remained dry barrier cream  Applied to buttocks    Patient turned  Onto his right side  Questioned  Again how he got here wanted to call  Dad  But fell asleep

## 2019-01-31 NOTE — Progress Notes (Signed)
Patient is checked for incontinence of bowl and bladder, patient is cleaned with water and soap and skin barrier cream applied to bottom area, sleep is continuous and no distress safety is maintained and 1&1 is in place.

## 2019-01-31 NOTE — Progress Notes (Signed)
Recreation Therapy Notes   Date: 01/31/2019  Time: 9:30 am   Location: Craft room   Behavioral response: N/A   Intervention Topic: Stress  Discussion/Intervention: Patient did not attend group.   Clinical Observations/Feedback:  Patient did not attend group.   Troy Fox LRT/CTRS        [name] 01/31/2019 10:45 AM

## 2019-01-31 NOTE — Plan of Care (Signed)
Patient is improving and was able to talk, eat and take medication orally this evening. This is better than how the patient presented on Monday evening.   Problem: Education: Goal: Emotional status will improve Outcome: Not Progressing Goal: Mental status will improve Outcome: Progressing

## 2019-01-31 NOTE — Progress Notes (Addendum)
7-11 Continuous 1:1  08:00 Patient laying  In flat position  With [facility] elevated  For comfort.  Responding to verbal command.  Patient  Remain NPO  For ECT this am . Patient able to state his name . Questioned where his father was  Patient turned onto right side  Barrier cream applied to buttocks  No breaks  In skin  100:00 Patient escorted to ECT via  wheelchair

## 2019-01-31 NOTE — H&P (Signed)
Troy Fox is an 48 y.o. male.   Chief Complaint: Patient seen chart reviewed.  See daily notes.  48 year old man with a history of schizoaffective disorder currently in a catatonic depressive state.  Patient is unable to give any history at all.  Flat affect.  Barely moving.  Posturing. HPI: Patient has a history of chronic mental illness with a recent extended episode of depression.  Has had catatonic presentations like this in the past and has responded well to ECT  Past Medical History:  Diagnosis Date  . Bipolar 1 disorder (HCC)   . 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 GERD (gastroesophageal reflux disease)   . Nonverbal   . Schizoaffective disorder (HCC)   . Tardive dyskinesia     History reviewed. No pertinent surgical history.  Family History  Adopted: Yes  Family history unknown: Yes   Social History:  reports that he has never smoked. He has never used smokeless tobacco. He reports that he does not drink alcohol or use drugs.  Allergies: No Known Allergies  Medications Prior to Admission  Medication Sig Dispense Refill  . atenolol (TENORMIN) 25 MG tablet Take 1 tablet (25 mg total) by mouth daily. 90 tablet 0  . benztropine (COGENTIN) 1 MG tablet Take 1 tablet (1 mg total) by mouth 2 (two) times daily for 7 days. 14 tablet 0  . busPIRone (BUSPAR) 5 MG tablet Take 1 tablet (5 mg total) by mouth 2 (two) times daily. (Patient taking differently: Take 5 mg by mouth 2 (two) times daily as needed. ) 60 tablet 0  . Cyanocobalamin (VITAMIN B 12) 500 MCG TABS Take 500 mcg by mouth daily. 90 tablet 0  . LORazepam (ATIVAN) 0.5 MG tablet Take 1 tablet (0.5 mg total) by mouth every 8 (eight) hours as needed for anxiety. 90 tablet 2  . omeprazole (PRILOSEC) 40 MG capsule Take 1 capsule (40 mg total) by mouth daily. 90 capsule 0  . paliperidone (INVEGA) 6 MG 24 hr tablet Take 1 tablet (6 mg total) by mouth daily. 90 tablet 0  . polyethylene glycol (MIRALAX) 17 g packet Take 17 g by mouth  daily as needed for moderate constipation. 72 packet 0  . sertraline (ZOLOFT) 50 MG tablet Take 3 tablets (150 mg total) by mouth daily. 270 tablet 0  . traZODone (DESYREL) 50 MG tablet Take 1 tablet (50 mg total) by mouth at bedtime. 90 tablet 0  . Valbenazine Tosylate (INGREZZA) 40 & 80 MG CPPK Take 40 mg by mouth daily for 7 days, THEN 80 mg daily for 30 days. (Patient not taking: Reported on 01/24/2019) 30 each 0    Results for orders placed or performed during the hospital encounter of 01/29/19 (from the past 48 hour(s))  SARS Coronavirus 2 (CEPHEID - Performed in [facility] hospital lab), Hosp Order     Status: None   Collection Time: 01/29/19  3:41 PM  Result Value Ref Range   SARS Coronavirus 2 NEGATIVE NEGATIVE    Comment: (NOTE) If result is NEGATIVE SARS-CoV-2 target nucleic acids are NOT DETECTED. The SARS-CoV-2 RNA is generally detectable in upper and lower  respiratory specimens during the acute phase of infection. The lowest  concentration of SARS-CoV-2 viral copies this assay can detect is 250  copies / mL. A negative result does not preclude SARS-CoV-2 infection  and should not be used as the sole basis for treatment or other  patient management decisions.  A negative result may occur with  improper specimen  collection / handling, submission of specimen other  than nasopharyngeal swab, presence of viral mutation(s) within the  areas targeted by this assay, and inadequate number of viral copies  (<250 copies / mL). A negative result must be combined with clinical  observations, patient history, and epidemiological information. If result is POSITIVE SARS-CoV-2 target nucleic acids are DETECTED. The SARS-CoV-2 RNA is generally detectable in upper and lower  respiratory specimens dur ing the acute phase of infection.  Positive  results are indicative of active infection with SARS-CoV-2.  Clinical  correlation with patient history and other diagnostic information is   necessary to determine patient infection status.  Positive results do  not rule out bacterial infection or co-infection with other viruses. If result is PRESUMPTIVE POSTIVE SARS-CoV-2 nucleic acids MAY BE PRESENT.   A presumptive positive result was obtained on the submitted specimen  and confirmed on repeat testing.  While 2019 novel coronavirus  (SARS-CoV-2) nucleic acids may be present in the submitted sample  additional confirmatory testing may be necessary for epidemiological  and / or clinical management purposes  to differentiate between  SARS-CoV-2 and other Sarbecovirus currently known to infect humans.  If clinically indicated additional testing with an alternate test  methodology [id number]) is advised. The SARS-CoV-2 RNA is generally  detectable in upper and lower respiratory sp ecimens during the acute  phase of infection. The expected result is Negative. Fact Sheet for Patients:  BoilerBrush.com.cy Fact Sheet for Healthcare Providers: https://pope.com/ This test is not yet approved or cleared by the [place] FDA and has been authorized for detection and/or diagnosis of SARS-CoV-2 by FDA under an Emergency Use Authorization (EUA).  This EUA will remain in effect (meaning this test can be used) for the duration of the COVID-19 declaration under Section 564(b)(1) of the Act, 21 U.S.C. section 360bbb-3(b)(1), unless the authorization is terminated or revoked sooner. Performed at [facility], [address] [place], [place] [address]   Vitamin B12     Status: None   Collection Time: 01/30/19  6:47 AM  Result Value Ref Range   Vitamin B-12 834 180 - 914 pg/mL    Comment: (NOTE) This assay is not validated for testing neonatal or myeloproliferative syndrome specimens for Vitamin B12 levels. Performed at [facility], [address], [place], [place] [address]   Hemoglobin A1c     Status: None    Collection Time: 01/30/19  6:47 AM  Result Value Ref Range   Hgb A1c MFr Bld 4.9 4.8 - 5.6 %    Comment: (NOTE) Pre diabetes:          5.7%-6.4% Diabetes:              >6.4% Glycemic control for   <7.0% adults with diabetes    Mean Plasma Glucose 93.93 mg/dL    Comment: Performed at [facility], [address] [place], [place] [address]  TSH     Status: None   Collection Time: 01/30/19  6:47 AM  Result Value Ref Range   TSH 1.193 0.350 - 4.500 uIU/mL    Comment: Performed by a 3rd Generation assay with a functional sensitivity of <=0.01 uIU/mL. Performed at [facility], [address] [place], [place] [address]    No results found.  Review of Systems  Unable to perform ROS: Psychiatric disorder    Blood pressure 117/80, pulse 96, temperature 98.5 F (36.9 C), temperature source Oral, resp. rate 16, height  (1.651 m),  weight 63.5 kg, SpO2 97 %. Physical Exam  Nursing note and vitals reviewed. Constitutional: He appears well-developed.  HENT:  Head: Normocephalic and atraumatic.  Eyes: Pupils are equal, round, and reactive to light. Conjunctivae are normal.  Neck: Normal range of motion.  Cardiovascular: Regular rhythm and normal heart sounds.  Respiratory: Effort normal.  GI: Soft.  Musculoskeletal: Normal range of motion.  Neurological: He is alert. He exhibits abnormal muscle tone.  Patient has abnormal tone throughout with stiffness.  Some degree of ratcheting stiffness.  Postures typically but is able to move with gentle direction and assistance.  Skin: Skin is warm and dry.  Psychiatric: His affect is blunt. He is slowed. He is noncommunicative.     Assessment/Plan Patient currently showing catatonic severe depression with evidence of psychotic symptoms as well.  Physically cooperative and medically stable.  Plan is to initiate ECT bilateral 3 times a week.  Troy Rasmussen, MD 01/31/2019, 10:04 AM

## 2019-01-31 NOTE — Telephone Encounter (Signed)
Patient's mom called to inform us that patient has been admitted to [facility] under Dr. Toni Amend care. She stated patient is undergoing ECT treatment.

## 2019-01-31 NOTE — Anesthesia Preprocedure Evaluation (Signed)
Anesthesia Evaluation  Patient identified by MRN, date of birth, ID band Patient awake    Reviewed: Allergy & Precautions, NPO status , Patient's Chart, lab work & pertinent test results  Airway Mallampati: II  TM Distance: >3 FB     Dental   Pulmonary neg pulmonary ROS,    Pulmonary exam normal        Cardiovascular negative cardio ROS Normal cardiovascular exam     Neuro/Psych PSYCHIATRIC DISORDERS Depression Bipolar Disorder Schizophrenia Currently nonverbalnegative neurological ROS     GI/Hepatic Neg liver ROS, GERD  ,  Endo/Other  negative endocrine ROS  Renal/GU negative Renal ROS  negative genitourinary   Musculoskeletal negative musculoskeletal ROS (+)   Abdominal Normal abdominal exam  (+)   Peds negative pediatric ROS (+)  Hematology negative hematology ROS (+)   Anesthesia Other Findings Past Medical History: No date: Bipolar 1 disorder (HCC) No date: 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HCC) No date: GERD (gastroesophageal reflux disease) No date: Nonverbal No date: Schizoaffective disorder (HCC) No date: Tardive dyskinesia  Reproductive/Obstetrics                             Anesthesia Physical Anesthesia Plan  ASA: II  Anesthesia Plan: General   Post-op Pain Management:    Induction: Intravenous  PONV Risk Score and Plan:   Airway Management Planned: Mask  Additional Equipment:   Intra-op Plan:   Post-operative Plan:   Informed Consent: I have reviewed the patients History and Physical, chart, labs and discussed the procedure including the risks, benefits and alternatives for the proposed anesthesia with the patient or authorized representative who has indicated his/her understanding and acceptance.     Dental advisory given  Plan Discussed with: CRNA and Surgeon  Anesthesia Plan Comments:         Anesthesia Quick Evaluation

## 2019-01-31 NOTE — BHH Group Notes (Signed)
LCSW Group Therapy Note  01/31/2019 1:00 PM  Type of Therapy/Topic:  Group Therapy:  Emotion Regulation  Participation Level:  Did Not Attend   Description of Group:   The purpose of this group is to assist patients in learning to regulate negative emotions and experience positive emotions. Patients will be guided to discuss ways in which they have been vulnerable to their negative emotions. These vulnerabilities will be juxtaposed with experiences of positive emotions or situations, and patients will be challenged to use positive emotions to combat negative ones. Special emphasis will be placed on coping with negative emotions in conflict situations, and patients will process healthy conflict resolution skills.  Therapeutic Goals: 1. Patient will identify two positive emotions or experiences to reflect on in order to balance out negative emotions 2. Patient will label two or more emotions that they find the most difficult to experience 3. Patient will demonstrate positive conflict resolution skills through discussion and/or role plays  Summary of Patient Progress: X  Therapeutic Modalities:   Cognitive Behavioral Therapy Feelings Identification Dialectical Behavioral Therapy  Penni Homans, MSW, LCSW 01/31/2019 12:44 PM

## 2019-01-31 NOTE — Progress Notes (Signed)
18:00 Patient able to speak with father on phone

## 2019-01-31 NOTE — Progress Notes (Signed)
[facility] MD Progress Note  01/31/2019 12:38 PM Troy Fox  MRN:  [id number] Subjective: Patient seen chart reviewed.  48 year old man with schizoaffective disorder.  He remains almost completely noncommunicative.  Catatonic.  Posturing much of the time.  He will move if gently assisted but spontaneously does not make any movement or gesture and is not speaking although he appears to be awake.  Eating very little.  Only able to go to the commode sometimes.  Patient had ECT this morning bilateral treatment.  No complications at all. Principal Problem: Schizoaffective disorder, depressive type (HCC) Diagnosis: Principal Problem:   Schizoaffective disorder, depressive type (HCC) Active Problems:   Catatonia   Mild malnutrition (HCC)  Total Time spent with patient: 30 minutes  Past Psychiatric History: Long history of schizoaffective disorder.  Past history of good response to ECT  Past Medical History:  Past Medical History:  Diagnosis Date  . Bipolar 1 disorder (HCC)   . 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 GERD (gastroesophageal reflux disease)   . Nonverbal   . Schizoaffective disorder (HCC)   . Tardive dyskinesia    History reviewed. No pertinent surgical history. Family History:  Family History  Adopted: Yes  Family history unknown: Yes   Family Psychiatric  History: None known Social History:  Social History   Substance and Sexual Activity  Alcohol Use Never  . Frequency: Never     Social History   Substance and Sexual Activity  Drug Use Never    Social History   Socioeconomic History  . Marital status: Single    Spouse name: Not on file  . Number of children: Not on file  . Years of education: Not on file  . Highest education level: Not on file  Occupational History  . Not on file  Social Needs  . Financial resource strain: Not on file  . Food insecurity:    Worry: Not on file    Inability: Not on file  . Transportation needs:    Medical: Not on file     Non-medical: Not on file  Tobacco Use  . Smoking status: Never Smoker  . Smokeless tobacco: Never Used  Substance and Sexual Activity  . Alcohol use: Never    Frequency: Never  . Drug use: Never  . Sexual activity: Not Currently  Lifestyle  . Physical activity:    Days per week: Not on file    Minutes per session: Not on file  . Stress: Not on file  Relationships  . Social connections:    Talks on phone: Not on file    Gets together: Not on file    Attends religious service: Not on file    Active member of club or organization: Not on file    Attends meetings of clubs or organizations: Not on file    Relationship status: Not on file  Other Topics Concern  . Not on file  Social History Narrative  . Not on file   Additional Social History:    Pain Medications: please see mar Prescriptions: please see mar Over the Counter: please see mar History of alcohol / drug use?: No history of alcohol / drug abuse Longest period of sobriety (when/how long): NA                    Sleep: Fair  Appetite:  Fair  Current Medications: Current Facility-Administered Medications  Medication Dose Route Frequency Provider Last Rate Last Dose  . acetaminophen (TYLENOL) tablet 650 mg  650 mg Oral Q6H PRN Katisha Shimizu T, MD      . alum & mag hydroxide-simeth (MAALOX/MYLANTA) 200-200-20 MG/5ML suspension 30 mL  30 mL Oral Q4H PRN Keryl Gholson T, MD      . fentaNYL (SUBLIMAZE) injection 25 mcg  25 mcg Intravenous Q5 min PRN Yves Dillarroll, Paul, MD      . lactated ringers infusion   Intravenous Continuous Apryl Brymer, Jackquline DenmarkJohn T, MD 10 mL/hr at 01/31/19 1024    . LORazepam (ATIVAN) injection 1 mg  1 mg Intramuscular Q6H Tekoa Amon T, MD   1 mg at 01/30/19 2204  . magnesium hydroxide (MILK OF MAGNESIA) suspension 30 mL  30 mL Oral Daily PRN Edker Punt T, MD      . ondansetron (ZOFRAN) injection 4 mg  4 mg Intravenous Once PRN Yves Dillarroll, Paul, MD      . pantoprazole (PROTONIX) EC tablet 40 mg  40 mg  Oral Daily Garnett Rekowski, Jackquline DenmarkJohn T, MD   40 mg at 01/29/19 1813  . polyethylene glycol (MIRALAX / GLYCOLAX) packet 17 g  17 g Oral Daily Shakura Cowing, Jackquline DenmarkJohn T, MD   17 g at 01/30/19 16100828  . traZODone (DESYREL) tablet 100 mg  100 mg Oral QHS Hale Chalfin T, MD   100 mg at 01/30/19 2204  . vitamin B-12 (CYANOCOBALAMIN) tablet 1,000 mcg  1,000 mcg Oral Daily Axton Cihlar, Jackquline DenmarkJohn T, MD   1,000 mcg at 01/29/19 1813    Lab Results:  Results for orders placed or performed during the hospital encounter of 01/29/19 (from the past 48 hour(s))  SARS Coronavirus 2 (CEPHEID - Performed in [facility] lab), Hosp Order     Status: None   Collection Time: 01/29/19  3:41 PM  Result Value Ref Range   SARS Coronavirus 2 NEGATIVE NEGATIVE    Comment: (NOTE) If result is NEGATIVE SARS-CoV-2 target nucleic acids are NOT DETECTED. The SARS-CoV-2 RNA is generally detectable in upper and lower  respiratory specimens during the acute phase of infection. The lowest  concentration of SARS-CoV-2 viral copies this assay can detect is 250  copies / mL. A negative result does not preclude SARS-CoV-2 infection  and should not be used as the sole basis for treatment or other  patient management decisions.  A negative result may occur with  improper specimen collection / handling, submission of specimen other  than nasopharyngeal swab, presence of viral mutation(s) within the  areas targeted by this assay, and inadequate number of viral copies  (<250 copies / mL). A negative result must be combined with clinical  observations, patient history, and epidemiological information. If result is POSITIVE SARS-CoV-2 target nucleic acids are DETECTED. The SARS-CoV-2 RNA is generally detectable in upper and lower  respiratory specimens dur ing the acute phase of infection.  Positive  results are indicative of active infection with SARS-CoV-2.  Clinical  correlation with patient history and other diagnostic information is  necessary to  determine patient infection status.  Positive results do  not rule out bacterial infection or co-infection with other viruses. If result is PRESUMPTIVE POSTIVE SARS-CoV-2 nucleic acids MAY BE PRESENT.   A presumptive positive result was obtained on the submitted specimen  and confirmed on repeat testing.  While 2019 novel coronavirus  (SARS-CoV-2) nucleic acids may be present in the submitted sample  additional confirmatory testing may be necessary for epidemiological  and / or clinical management purposes  to differentiate between  SARS-CoV-2 and other Sarbecovirus currently known to infect humans.  If clinically  indicated additional testing with an alternate test  methodology [phone]) is advised. The SARS-CoV-2 RNA is generally  detectable in upper and lower respiratory sp ecimens during the acute  phase of infection. The expected result is Negative. Fact Sheet for Patients:  BoilerBrush.com.cy Fact Sheet for Healthcare Providers: https://pope.com/ This test is not yet approved or cleared by the [place] FDA and has been authorized for detection and/or diagnosis of SARS-CoV-2 by FDA under an Emergency Use Authorization (EUA).  This EUA will remain in effect (meaning this test can be used) for the duration of the COVID-19 declaration under Section 564(b)(1) of the Act, 21 U.S.C. section 360bbb-3(b)(1), unless the authorization is terminated or revoked sooner. Performed at [facility], [address] [place], [place] [address]   Vitamin B12     Status: None   Collection Time: 01/30/19  6:47 AM  Result Value Ref Range   Vitamin B-12 834 180 - 914 pg/mL    Comment: (NOTE) This assay is not validated for testing neonatal or myeloproliferative syndrome specimens for Vitamin B12 levels. Performed at [facility], [address], [place], [place] [address]   Hemoglobin A1c     Status: None   Collection Time:  01/30/19  6:47 AM  Result Value Ref Range   Hgb A1c MFr Bld 4.9 4.8 - 5.6 %    Comment: (NOTE) Pre diabetes:          5.7%-6.4% Diabetes:              >6.4% Glycemic control for   <7.0% adults with diabetes    Mean Plasma Glucose 93.93 mg/dL    Comment: Performed at [facility], [address] [place], [place] [address]  TSH     Status: None   Collection Time: 01/30/19  6:47 AM  Result Value Ref Range   TSH 1.193 0.350 - 4.500 uIU/mL    Comment: Performed by a 3rd Generation assay with a functional sensitivity of <=0.01 uIU/mL. Performed at [facility], [address] [place], [place] [address]     Blood Alcohol level:  Lab Results  Component Value Date   [facility] <10 01/24/2019    Metabolic Disorder Labs: Lab Results  Component Value Date   HGBA1C 4.9 01/30/2019   MPG 93.93 01/30/2019   No results found for: PROLACTIN No results found for: CHOL, TRIG, HDL, CHOLHDL, VLDL, LDLCALC  Physical Findings: AIMS: Facial and Oral Movements Muscles of Facial Expression: None, normal Lips and Perioral Area: None, normal Jaw: None, normal Tongue: None, normal,Extremity Movements Upper (arms, wrists, hands, fingers): None, normal Lower (legs, knees, ankles, toes): None, normal, Trunk Movements Neck, shoulders, hips: None, normal, Overall Severity Severity of abnormal movements (highest score from questions above): None, normal Incapacitation due to abnormal movements: None, normal Patient's awareness of abnormal movements (rate only patient's report): No Awareness, Dental Status Current problems with teeth and/or dentures?: No Does patient usually wear dentures?: No  CIWA:    COWS:     Musculoskeletal: Strength & Muscle Tone: within normal limits Gait & Station: unable to stand Patient leans: N/A  Psychiatric Specialty Exam: Physical Exam  Nursing note and vitals reviewed. Constitutional: He appears well-developed and well-nourished.  HENT:  Head:  Normocephalic and atraumatic.  Eyes: Pupils are equal, round, and reactive to light. Conjunctivae are normal.  Neck: Normal range of motion.  Cardiovascular: Regular rhythm and normal heart sounds.  Respiratory: Effort normal.  GI: Soft.  Musculoskeletal: Normal range of  motion.  Neurological: He is alert. He exhibits abnormal muscle tone.  Skin: Skin is warm and dry.  Psychiatric: His affect is blunt. He is noncommunicative.    Review of Systems  Unable to perform ROS: Psychiatric disorder    Blood pressure 113/70, pulse 81, temperature 98.4 F (36.9 C), resp. rate 15, height 5\' 5"  (1.651 m), weight 63.5 kg, SpO2 95 %.Body mass index is 23.3 kg/m.  General Appearance: Casual  Eye Contact:  Minimal  Speech:  Negative  Volume:  Decreased  Mood:  Negative  Affect:  Flat  Thought Process:  NA  Orientation:  Negative  Thought Content:  Negative  Suicidal Thoughts:  No  Homicidal Thoughts:  No  Memory:  Negative  Judgement:  Negative  Insight:  Negative  Psychomotor Activity:  Negative  Concentration:  Concentration: Poor  Recall:  Negative  Fund of Knowledge:  Negative  Language:  Negative  Akathisia:  Negative  Handed:  Right  AIMS (if indicated):     Assets:  Health and safety inspector Housing Physical Health Resilience Social Support  ADL's:  Impaired  Cognition:  Impaired,  Severe  Sleep:  Number of Hours: 5     Treatment Plan Summary: Daily contact with patient to assess and evaluate symptoms and progress in treatment, Medication management and Plan Patient had a robust seizure but of short duration.  I will increase stimulus energy next time.  Also cut back on the Ativan which is probably not doing anything for him right now.  Hope to see some improvement this week.  Spoke with nursing about his ongoing needs for special care and they are taking care of all of this.  No other change to orders today.  Mordecai Rasmussen, MD 01/31/2019, 12:38 PM

## 2019-01-31 NOTE — Progress Notes (Addendum)
11-3 Continuous 1:1  11:37Patient returned from ECT via wheelchair. Patient  incontinent of urine . Patient questioned  Why was he here and where was his daddy.  Patient cleaned  pubic   and at buttocks . Barrier cream applied  To buttocks and around penis  Patient able to stand on his own   Able to walk with the assistance of staff . Patient escorted to dayroom  For dinner  Which he ate 100%. Patient  Able to move all extremities .  12:47 Patient returned to bed  Turned onto left  side  For comfort . Staff checked prior to going to bed for incontinence, patient dry barrier cream applied to buttocks and at pubic area. 1420 Patient out of bed for ten minutes in chair.  Patient checked prior to going back to bed  Remained dry barrier cream applied to buttocks and between leg                                 5

## 2019-01-31 NOTE — Procedures (Signed)
ECT SERVICES Physician's Interval Evaluation & Treatment Note  Patient Identification: Logyn Craig MRN:  [id number] Date of Evaluation:  01/31/2019 TX #: 1  MADRS:   MMSE:   P.E. Findings:  Patient is catatonic with stiffness and some waxy flexibility.  Notable still has tardive dyskinesia.  Psychiatric Interval Note:  Catatonic no communication lots of posturing.  Subjective:  Patient is a 48 y.o. male seen for evaluation for Electroconvulsive Therapy. Not able to give any history himself  Treatment Summary:   []   Right Unilateral             [x]  Bilateral   % Energy : 1.0 ms 30%   Impedance: 2100 ohms  Seizure Energy Index: 16,300 V squared  Postictal Suppression Index: 78%  Seizure Concordance Index: 92%  Medications  Pre Shock: Brevital 70 mg succinylcholine 90 mg  Post Shock:    Seizure Duration: 19 seconds by both EM G and EEG   Comments: Short duration seizure we will increase energy next time continue bilateral treatment 3 times a week  Lungs:  [x]   Clear to auscultation               []  Other:   Heart:    [x]   Regular rhythm             []  irregular rhythm    [x]   Previous H&P reviewed, patient examined and there are NO CHANGES                 []   Previous H&P reviewed, patient examined and there are changes noted.   Mordecai Rasmussen, MD 5/20/202012:52 PM

## 2019-02-01 ENCOUNTER — Other Ambulatory Visit: Payer: Self-pay | Admitting: Psychiatry

## 2019-02-01 NOTE — BHH Group Notes (Signed)
LCSW Group Therapy Note  02/01/2019 11:52 AM  Type of Therapy/Topic:  Group Therapy:  Balance in Life  Participation Level:  Did Not Attend  Description of Group:    This group will address the concept of balance and how it feels and looks when one is unbalanced. Patients will be encouraged to process areas in their lives that are out of balance and identify reasons for remaining unbalanced. Facilitators will guide patients in utilizing problem-solving interventions to address and correct the stressor making their life unbalanced. Understanding and applying boundaries will be explored and addressed for obtaining and maintaining a balanced life. Patients will be encouraged to explore ways to assertively make their unbalanced needs known to significant others in their lives, using other group members and facilitator for support and feedback.  Therapeutic Goals: 1. Patient will identify two or more emotions or situations they have that consume much of in their lives. 2. Patient will identify signs/triggers that life has become out of balance:  3. Patient will identify two ways to set boundaries in order to achieve balance in their lives:  4. Patient will demonstrate ability to communicate their needs through discussion and/or role plays  Summary of Patient Progress: x     Therapeutic Modalities:   Cognitive Behavioral Therapy Solution-Focused Therapy Assertiveness Training  Iris Pert, MSW, LCSW Clinical Social Work 02/01/2019 11:52 AM

## 2019-02-01 NOTE — Progress Notes (Signed)
Patient remains on a 1:1. Patient sat up for vitals this morning. Patient remains safe on the unit with a 1:1 MHT present for safety.

## 2019-02-01 NOTE — BHH Counselor (Signed)
CSW attempted to complete PSA with patient, however, was unable to do so as patient presented in a catatonic state.  Patient was unable to provide any history.   Penni Homans, MSW, LCSW 02/01/2019 12:00 PM

## 2019-02-01 NOTE — CV Procedure (Signed)
Patient remains to lie back in bed turns self in  Bed , preferred to lie on back . Moving all extremities

## 2019-02-01 NOTE — Progress Notes (Signed)
Patient is up and out of bed with assist , ambulate close to the nurses and back, assisted to the bath room and voided , patient is washed with soap and water, padded dry barrier cream applied to bottom and returned patient to bed with assist no distress 1&1 is continued for safety.

## 2019-02-01 NOTE — Progress Notes (Signed)
Patient woke up and ambulated to the restroom with the 1:1 MHT that was with him. Patient voided and then when back to sleep (0300). Patient remains on a 1:1 for safety. Patient slept well through the night.

## 2019-02-01 NOTE — Progress Notes (Signed)
Recreation Therapy Notes  Date: 02/01/2019   Time: 9:30 am   Location: Craft room   Behavioral response: N/A   Intervention Topic: Decision Making  Discussion/Intervention: Patient did not attend group.   Clinical Observations/Feedback:  Patient did not attend group.   Dima Ferrufino LRT/CTRS          Creta Dorame 02/01/2019 10:32 AM

## 2019-02-01 NOTE — Plan of Care (Signed)
Patient is in bed at this time relaxed and comfortable, explained plan of care including ECT , which patient said he will decline it tomorrow, but education is provided, support and encouragement is provided for emotional support, patient acknowledged. Turn and repositioned is done, no distress.. Problem: Education: Goal: Knowledge of [facility] Education information/materials will improve Outcome: Progressing Goal: Emotional status will improve Outcome: Progressing Goal: Mental status will improve Outcome: Progressing Goal: Verbalization of understanding the information provided will improve Outcome: Progressing   Problem: Activity: Goal: Interest or engagement in activities will improve Outcome: Progressing Goal: Sleeping patterns will improve Outcome: Progressing   Problem: Coping: Goal: Ability to verbalize frustrations and anger appropriately will improve Outcome: Progressing Goal: Ability to demonstrate self-control will improve Outcome: Progressing   Problem: Health Behavior/Discharge Planning: Goal: Identification of resources available to assist in meeting health care needs will improve Outcome: Progressing Goal: Compliance with treatment plan for underlying cause of condition will improve Outcome: Progressing   Problem: Physical Regulation: Goal: Ability to maintain clinical measurements within normal limits will improve Outcome: Progressing   Problem: Safety: Goal: Periods of time without injury will increase Outcome: Progressing

## 2019-02-01 NOTE — Progress Notes (Signed)
Patient received and took his pm meds from the nurse noted., 1&1 in place for safety

## 2019-02-01 NOTE — Progress Notes (Signed)
[facility] MD Progress Note  02/01/2019 3:08 PM Troy Fox  MRN:  [id number] Subjective: Patient seen chart reviewed.  This is a 48 year old man with schizoaffective disorder and a catatonic state.  He had ECT treatment for the first time at our facility yesterday.  After treatment he was markedly improved.  He ate his lunch and spoke at length with several nurses.  Today the patient has regressed back into a lot of his catatonia although it does not seem quite as bad as it was before.  He turned to look at me when I walked in the room and tried to utter a couple of words although he was incoherent.  He has not eaten much today but he has stayed awake and seems to be moving his arms with more intention.  No other new physical problems.  No sign of any aggression or acutely dangerous behavior.  Tolerated ECT well with no complication Principal Problem: Schizoaffective disorder, depressive type (HCC) Diagnosis: Principal Problem:   Schizoaffective disorder, depressive type (HCC) Active Problems:   Catatonia   Mild malnutrition (HCC)  Total Time spent with patient: 30 minutes  Past Psychiatric History: Patient has a history of schizoaffective disorder and has previously had a good response to ECT.  Past Medical History:  Past Medical History:  Diagnosis Date  . Bipolar 1 disorder (HCC)   . 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 GERD (gastroesophageal reflux disease)   . Nonverbal   . Schizoaffective disorder (HCC)   . Tardive dyskinesia    History reviewed. No pertinent surgical history. Family History:  Family History  Adopted: Yes  Family history unknown: Yes   Family Psychiatric  History: None known Social History:  Social History   Substance and Sexual Activity  Alcohol Use Never  . Frequency: Never     Social History   Substance and Sexual Activity  Drug Use Never    Social History   Socioeconomic History  . Marital status: Single    Spouse name: Not on file  . Number of  children: Not on file  . Years of education: Not on file  . Highest education level: Not on file  Occupational History  . Not on file  Social Needs  . Financial resource strain: Not on file  . Food insecurity:    Worry: Not on file    Inability: Not on file  . Transportation needs:    Medical: Not on file    Non-medical: Not on file  Tobacco Use  . Smoking status: Never Smoker  . Smokeless tobacco: Never Used  Substance and Sexual Activity  . Alcohol use: Never    Frequency: Never  . Drug use: Never  . Sexual activity: Not Currently  Lifestyle  . Physical activity:    Days per week: Not on file    Minutes per session: Not on file  . Stress: Not on file  Relationships  . Social connections:    Talks on phone: Not on file    Gets together: Not on file    Attends religious service: Not on file    Active member of club or organization: Not on file    Attends meetings of clubs or organizations: Not on file    Relationship status: Not on file  Other Topics Concern  . Not on file  Social History Narrative  . Not on file   Additional Social History:    Pain Medications: please see mar Prescriptions: please see mar Over the Counter: please  see mar History of alcohol / drug use?: No history of alcohol / drug abuse Longest period of sobriety (when/how long): NA                    Sleep: Fair  Appetite:  Fair  Current Medications: Current Facility-Administered Medications  Medication Dose Route Frequency Provider Last Rate Last Dose  . acetaminophen (TYLENOL) tablet 650 mg  650 mg Oral Q6H PRN Jarnell Cordaro T, MD      . alum & mag hydroxide-simeth (MAALOX/MYLANTA) 200-200-20 MG/5ML suspension 30 mL  30 mL Oral Q4H PRN Thurley Francesconi T, MD      . fentaNYL (SUBLIMAZE) injection 25 mcg  25 mcg Intravenous Q5 min PRN Yves Dill, MD      . lactated ringers infusion   Intravenous Continuous Kidus Delman, Jackquline Denmark, MD 10 mL/hr at 01/31/19 1024    . magnesium hydroxide (MILK  OF MAGNESIA) suspension 30 mL  30 mL Oral Daily PRN Malaiya Paczkowski T, MD      . ondansetron (ZOFRAN) injection 4 mg  4 mg Intravenous Once PRN Yves Dill, MD      . pantoprazole (PROTONIX) EC tablet 40 mg  40 mg Oral Daily Merek Niu, Jackquline Denmark, MD   40 mg at 02/01/19 0809  . polyethylene glycol (MIRALAX / GLYCOLAX) packet 17 g  17 g Oral Daily Betti Goodenow, Jackquline Denmark, MD   17 g at 02/01/19 0809  . traZODone (DESYREL) tablet 100 mg  100 mg Oral QHS Chancey Ringel T, MD   100 mg at 01/31/19 2135  . vitamin B-12 (CYANOCOBALAMIN) tablet 1,000 mcg  1,000 mcg Oral Daily Kaliegh Willadsen, Jackquline Denmark, MD   1,000 mcg at 02/01/19 1610    Lab Results: No results found for this or any previous visit (from the past 48 hour(s)).  Blood Alcohol level:  Lab Results  Component Value Date   ETH <10 01/24/2019    Metabolic Disorder Labs: Lab Results  Component Value Date   HGBA1C 4.9 01/30/2019   MPG 93.93 01/30/2019   No results found for: PROLACTIN No results found for: CHOL, TRIG, HDL, CHOLHDL, VLDL, LDLCALC  Physical Findings: AIMS: Facial and Oral Movements Muscles of Facial Expression: None, normal Lips and Perioral Area: None, normal Jaw: None, normal Tongue: None, normal,Extremity Movements Upper (arms, wrists, hands, fingers): None, normal Lower (legs, knees, ankles, toes): None, normal, Trunk Movements Neck, shoulders, hips: None, normal, Overall Severity Severity of abnormal movements (highest score from questions above): None, normal Incapacitation due to abnormal movements: None, normal Patient's awareness of abnormal movements (rate only patient's report): No Awareness, Dental Status Current problems with teeth and/or dentures?: No Does patient usually wear dentures?: No  CIWA:    COWS:     Musculoskeletal: Strength & Muscle Tone: within normal limits Gait & Station: unable to stand Patient leans: N/A  Psychiatric Specialty Exam: Physical Exam  Nursing note and vitals reviewed. Constitutional: He  appears well-developed and well-nourished.  HENT:  Head: Normocephalic and atraumatic.  Eyes: Pupils are equal, round, and reactive to light. Conjunctivae are normal.  Neck: Normal range of motion.  Cardiovascular: Regular rhythm and normal heart sounds.  Respiratory: Effort normal.  GI: Soft.  Musculoskeletal: Normal range of motion.  Neurological: He is alert.  Skin: Skin is warm and dry.  Psychiatric: His affect is blunt. He is slowed. Cognition and memory are impaired. He is noncommunicative.    Review of Systems  Unable to perform ROS: Psychiatric disorder    Blood  pressure 114/65, pulse 90, temperature 99.1 F (37.3 C), temperature source Oral, resp. rate 18, height 5\' 5"  (1.651 m), weight 63.5 kg, SpO2 94 %.Body mass index is 23.3 kg/m.  General Appearance: Casual  Eye Contact:  Minimal  Speech:  Garbled and Slow  Volume:  Decreased  Mood:  Negative  Affect:  Blunt  Thought Process:  Disorganized  Orientation:  Negative  Thought Content:  Negative  Suicidal Thoughts:  No  Homicidal Thoughts:  No  Memory:  Negative  Judgement:  Negative  Insight:  Negative  Psychomotor Activity:  Negative  Concentration:  Concentration: Negative  Recall:  Negative  Fund of Knowledge:  Negative  Language:  Negative  Akathisia:  Negative  Handed:  Right  AIMS (if indicated):     Assets:  Physical Health Resilience Social Support  ADL's:  Impaired  Cognition:  Impaired,  Moderate and Severe  Sleep:  Number of Hours: 6.25     Treatment Plan Summary: Daily contact with patient to assess and evaluate symptoms and progress in treatment, Medication management and Plan Patient has ECT scheduled again tomorrow morning.  As I mentioned in another note we will increase the dose of stimulus.  No change for now to medication.  Spent some time reviewing the plan with patient and encouraging him to eat and try to notify staff when he needs to go to the bathroom.  Mordecai RasmussenJohn Jamilette Suchocki,  MD 02/01/2019, 3:08 PM

## 2019-02-01 NOTE — Plan of Care (Signed)
Patient instructed on [facility] Education  staff continue to redirect on  information . Staff  encourage to work on emotional and mental status  improvement  Voice no concerns around sleep and wake cycles . No anger outburst  or verbalization of frustrations.  Voice no safety concerns   Problem: Education: Goal: Knowledge of [place] [facility] Education information/materials will improve Outcome: Progressing Goal: Emotional status will improve Outcome: Progressing Goal: Mental status will improve Outcome: Progressing Goal: Verbalization of understanding the information provided will improve Outcome: Progressing   Problem: Activity: Goal: Interest or engagement in activities will improve Outcome: Progressing Goal: Sleeping patterns will improve Outcome: Progressing   Problem: Coping: Goal: Ability to verbalize frustrations and anger appropriately will improve Outcome: Progressing Goal: Ability to demonstrate self-control will improve Outcome: Progressing   Problem: Health Behavior/Discharge Planning: Goal: Identification of resources available to assist in meeting health care needs will improve Outcome: Progressing Goal: Compliance with treatment plan for underlying cause of condition will improve Outcome: Progressing   Problem: Physical Regulation: Goal: Ability to maintain clinical measurements within normal limits will improve Outcome: Progressing   Problem: Safety: Goal: Periods of time without injury will increase Outcome: Progressing

## 2019-02-01 NOTE — Progress Notes (Addendum)
11-3 Continuous 1:1 11: Patient preferred to remained in bed moving all extremities   For lunch  Appetite remained poor. Affect remained flat  And depressed  12:00 Out of bed  Assisted to bathroom able to urinate at 12:20  2:00  Patient given a sponge bath  From 1:40 -2:00 diaper changed , patient incontinent barrier cream applied to buttocks   3:00 Patient was not wanting to get out of bed and sit in chair , remained in bed at this time.  Patient not allowing staff to  Turn  Preferred to slide back on to his back . Moving  All extremities . Voice no other concerns . Skin remain intact  No redness

## 2019-02-01 NOTE — Progress Notes (Addendum)
Nurse offered to shave patient over grown beard  but patient declined noted.

## 2019-02-01 NOTE — Progress Notes (Signed)
7-11 Continuous 1:1  7:30Patient affect flat this am shift . Appetite poor at breakfast . Staff assisted to feed patient this am.   8:00 Out of bed  Standing  Diaper checked   Remained dry barrier cream applied. Skin intact.  10:00 Out of bed standing  Diaper checked  Remained dry , barrier cream applied Skin intact  11:00  Remained in bed

## 2019-02-01 NOTE — Progress Notes (Signed)
3-7 Continuous 1:1 3:00 Resting in bed . Question if he wanted to get up and walk or get in chair . Patient stated  No 4:00 Patient check for incontinence  , patient dry  Cream applied to buttocks and between legs  Appetite remained poor at dinner  Moving about in bed on his own moving extremities  5:35 Patient  Escorted to bathroom voided large amount  Cream applied  On return to bed   630 Patient  Refused to walk about on unit or get into chair  Writer inspected buttocks  Applied barrier cream  Skin intact  With no redness  Patient  Continue to move all extremities ,  Will not allow to turn on to side  Patient slides back on to back .

## 2019-02-01 NOTE — Progress Notes (Signed)
Patient is resting comfortably in bed not sleeping , alert and oriented denies any pain maintaining face mask and responding to yes or no questions by nodding his head, patient is turned and repositioned  Bottom is dry, no redness,  pulled up and head of bed at 30 degrees no distress, 1&1 in place for sfety

## 2019-02-01 NOTE — Progress Notes (Signed)
Patient was assisted out of bed and ambulate patient  around the nurses station by his own power and back to his room, tolerated well , sat on the recliner before going back to bed no distress 1&1 remain in  place for safety.

## 2019-02-02 ENCOUNTER — Inpatient Hospital Stay: Payer: Medicare Other | Admitting: Anesthesiology

## 2019-02-02 LAB — GLUCOSE, CAPILLARY: Glucose-Capillary: 103 mg/dL — ABNORMAL HIGH (ref 70–99)

## 2019-02-02 MED ORDER — ASENAPINE MALEATE 5 MG SL SUBL
5.0000 mg | SUBLINGUAL_TABLET | Freq: Two times a day (BID) | SUBLINGUAL | Status: DC
  Administered 2019-02-02 – 2019-02-03 (×3): 5 mg via SUBLINGUAL
  Filled 2019-02-02 (×7): qty 1

## 2019-02-02 MED ORDER — SUCCINYLCHOLINE CHLORIDE 200 MG/10ML IV SOSY
PREFILLED_SYRINGE | INTRAVENOUS | Status: DC | PRN
  Administered 2019-02-02: 90 mg via INTRAVENOUS

## 2019-02-02 MED ORDER — SODIUM CHLORIDE 0.9 % IV SOLN
INTRAVENOUS | Status: DC | PRN
  Administered 2019-02-02: 10:00:00 via INTRAVENOUS

## 2019-02-02 MED ORDER — SODIUM CHLORIDE 0.9 % IV SOLN
500.0000 mL | Freq: Once | INTRAVENOUS | Status: AC
  Administered 2019-02-02: 10:00:00 500 mL via INTRAVENOUS

## 2019-02-02 MED ORDER — METHOHEXITAL SODIUM 0.5 G IJ SOLR
INTRAMUSCULAR | Status: AC
  Filled 2019-02-02: qty 500

## 2019-02-02 MED ORDER — SUCCINYLCHOLINE CHLORIDE 20 MG/ML IJ SOLN
INTRAMUSCULAR | Status: AC
  Filled 2019-02-02: qty 1

## 2019-02-02 MED ORDER — METHOHEXITAL SODIUM 100 MG/10ML IV SOSY
PREFILLED_SYRINGE | INTRAVENOUS | Status: DC | PRN
  Administered 2019-02-02: 70 mg via INTRAVENOUS

## 2019-02-02 NOTE — Anesthesia Post-op Follow-up Note (Signed)
Anesthesia QCDR form completed.        

## 2019-02-02 NOTE — Progress Notes (Signed)
1:1 Safety observation 0030: Pt is resting in bed with eyes closed. Respirations are even and unlabored. No distress is noted. Will continue to monitor 1:1 for safety by physician order.   0100: Pt is resting in bed with eyes closed. Respirations are even and unlabored. No distress is noted. Will continue to monitor 1:1 for safety by physician order.   0200: Pt is resting in bed with eyes closed. Respirations are even and unlabored. No distress is noted. Will continue to monitor 1:1 for safety by physician order.   0300: Pt is resting in bed with eyes closed. Respirations are even and unlabored. No distress is noted. Will continue to monitor 1:1 for safety by physician order.

## 2019-02-02 NOTE — Progress Notes (Signed)
Recreation Therapy Notes          Troy Fox 02/02/2019 11:38 AM 

## 2019-02-02 NOTE — Progress Notes (Signed)
Patient was observed walking with his assigned safety sitter down to the community room for dinner and it was told to this writer that patient ate fifty percent of his meal. Patient also went outside with staff and the other members on the unit after dinner. Patient is now back in his room with his assigned safety sitter and is talking with his father.

## 2019-02-02 NOTE — Progress Notes (Signed)
Patient's morning medications will be held until patient returns from ECT.

## 2019-02-02 NOTE — Progress Notes (Signed)
1:1 safety observation  2000: Pt was awake in bed. Pt was encouraged to talk to his father on phone. Pt was able to speak with him freely. Pt reported he was lethargic but denied pain or other concerns. Pt is able to move all limbs freely in bed. No distress noted or needs expressed. Will continue to monitor 1:1 with safety sitter per MD order.   2100: Pt resting in bed with eyes open. No distress noted. No needs expressed. Even unlabored respirations noted. Will continue to monitor.   2200: Pt resting in bed with eyes open. Respirations even and unlabored. No distress noted. Will monitor 1:1 for safety.   2300: Pt resting in bed with eyes opened. He reported to [facility] he was having difficulty falling asleep. Pt's RN gave medication as reflected in [facility] which included pt's evening medications. Pt encouraged to try to think of relaxing images and close eyes to try to sleep. Pt is now resting with eyes closed. Respirations even and unlabored. No distress is noted. Pt was assisted in repositioning himself in bed. Will continue to monitor with 1:1 sitter for safety.

## 2019-02-02 NOTE — Plan of Care (Signed)
D- Patient alert and oriented. Patient presents in an anxious, but pleasant mood on assessment stating that he slept well last night and had no major complaints to voice to this Clinical research associate. Patient denies any depression, however, he states that he has a little anxiety, but did not elaborate as to why he's anxious. Patient also denies SI, HI, AVH, and pain at this time. Patient had no stated goals for today.  A- Scheduled medications administered to patient, per MD orders. Support and encouragement provided.  Routine safety checks conducted every 15 minutes.  Patient informed to notify staff with problems or concerns.  R- No adverse drug reactions noted. Patient contracts for safety at this time. Patient compliant with medications and treatment plan. Patient receptive, calm, and cooperative. Patient interacts well with others on the unit.  Patient remains safe at this time.  Problem: Education: Goal: Knowledge of [place] [facility] Education information/materials will improve Outcome: Progressing Goal: Emotional status will improve Outcome: Progressing Goal: Mental status will improve Outcome: Progressing Goal: Verbalization of understanding the information provided will improve Outcome: Progressing   Problem: Activity: Goal: Interest or engagement in activities will improve Outcome: Progressing Goal: Sleeping patterns will improve Outcome: Progressing   Problem: Coping: Goal: Ability to verbalize frustrations and anger appropriately will improve Outcome: Progressing Goal: Ability to demonstrate self-control will improve Outcome: Progressing   Problem: Health Behavior/Discharge Planning: Goal: Identification of resources available to assist in meeting health care needs will improve Outcome: Progressing Goal: Compliance with treatment plan for underlying cause of condition will improve Outcome: Progressing   Problem: Physical Regulation: Goal: Ability to maintain clinical measurements  within normal limits will improve Outcome: Progressing   Problem: Safety: Goal: Periods of time without injury will increase Outcome: Progressing

## 2019-02-02 NOTE — Tx Team (Addendum)
Interdisciplinary Treatment and Diagnostic Plan Update  02/02/2019 Time of Session: [place] MRN: [id number]  Principal Diagnosis: Schizoaffective disorder, depressive type [facility])  Secondary Diagnoses: Principal Problem:   Schizoaffective disorder, depressive type ([place]) Active Problems:   Catatonia   Mild malnutrition ([place])   Current Medications:  Current Facility-Administered Medications  Medication Dose Route Frequency Provider Last Rate Last Dose  . acetaminophen (TYLENOL) tablet 650 mg  650 mg Oral Q6H PRN Clapacs, John T, MD      . alum & mag hydroxide-simeth (MAALOX/MYLANTA) 200-200-20 MG/5ML suspension 30 mL  30 mL Oral Q4H PRN Clapacs, John T, MD      . fentaNYL (SUBLIMAZE) injection 25 mcg  25 mcg Intravenous Q5 min PRN Alvin Critchley, MD      . lactated ringers infusion   Intravenous Continuous Clapacs, Madie [place], MD 10 mL/hr at 01/31/19 1024    . magnesium hydroxide (MILK OF MAGNESIA) suspension 30 mL  30 mL Oral Daily PRN Clapacs, John T, MD      . ondansetron (ZOFRAN) injection 4 mg  4 mg Intravenous Once PRN Alvin Critchley, MD      . pantoprazole (PROTONIX) EC tablet 40 mg  40 mg Oral Daily Clapacs, Madie Reno, MD   40 mg at 02/02/19 1240  . polyethylene glycol (MIRALAX / GLYCOLAX) packet 17 g  17 g Oral Daily Clapacs, Madie Reno, MD   17 g at 02/01/19 0809  . traZODone (DESYREL) tablet 100 mg  100 mg Oral QHS Clapacs, Madie Reno, MD   100 mg at 02/01/19 2139  . vitamin B-12 (CYANOCOBALAMIN) tablet 1,000 mcg  1,000 mcg Oral Daily Clapacs, Madie Reno, MD   1,000 mcg at 02/02/19 1239   PTA Medications: Medications Prior to Admission  Medication Sig Dispense Refill Last Dose  . atenolol (TENORMIN) 25 MG tablet Take 1 tablet (25 mg total) by mouth daily. 90 tablet 0 01/30/2019  . busPIRone (BUSPAR) 5 MG tablet Take 1 tablet (5 mg total) by mouth 2 (two) times daily. (Patient taking differently: Take 5 mg by mouth 2 (two) times daily as needed. ) 60 tablet 0 01/30/2019  . Cyanocobalamin  (VITAMIN B 12) 500 MCG TABS Take 500 mcg by mouth daily. 90 tablet 0 01/30/2019  . LORazepam (ATIVAN) 0.5 MG tablet Take 1 tablet (0.5 mg total) by mouth every 8 (eight) hours as needed for anxiety. 90 tablet 2 01/30/2019  . omeprazole (PRILOSEC) 40 MG capsule Take 1 capsule (40 mg total) by mouth daily. 90 capsule 0 01/30/2019  . paliperidone (INVEGA) 6 MG 24 hr tablet Take 1 tablet (6 mg total) by mouth daily. 90 tablet 0 01/30/2019  . polyethylene glycol (MIRALAX) 17 g packet Take 17 g by mouth daily as needed for moderate constipation. 72 packet 0 01/30/2019  . sertraline (ZOLOFT) 50 MG tablet Take 3 tablets (150 mg total) by mouth daily. 270 tablet 0 01/30/2019  . traZODone (DESYREL) 50 MG tablet Take 1 tablet (50 mg total) by mouth at bedtime. 90 tablet 0 01/30/2019  . Valbenazine Tosylate (INGREZZA) 40 & 80 MG CPPK Take 40 mg by mouth daily for 7 days, THEN 80 mg daily for 30 days. 30 each 0 01/30/2019  . benztropine (COGENTIN) 1 MG tablet Take 1 tablet (1 mg total) by mouth 2 (two) times daily for 7 days. 14 tablet 0 01/23/2019 at 1730    Patient Stressors: Financial difficulties Health problems  Patient Strengths: Motivation for treatment/growth Supportive family/friends  Treatment Modalities: Medication Management, Group  therapy, Case management,  1 to 1 session with clinician, Psychoeducation, Recreational therapy.   Physician Treatment Plan for Primary Diagnosis: Schizoaffective disorder, depressive type ([place]) Long Term Goal(s): Improvement in symptoms so as ready for discharge Improvement in symptoms so as ready for discharge   Short Term Goals: Ability to verbalize feelings will improve Compliance with prescribed medications will improve Ability to maintain clinical measurements within normal limits will improve  Medication Management: Evaluate patient's response, side effects, and tolerance of medication regimen.  Therapeutic Interventions: 1 to 1 sessions, Unit Group sessions  and Medication administration.  Evaluation of Outcomes: Not Met  Physician Treatment Plan for Secondary Diagnosis: Principal Problem:   Schizoaffective disorder, depressive type ([place]) Active Problems:   Catatonia   Mild malnutrition ([place])  Long Term Goal(s): Improvement in symptoms so as ready for discharge Improvement in symptoms so as ready for discharge   Short Term Goals: Ability to verbalize feelings will improve Compliance with prescribed medications will improve Ability to maintain clinical measurements within normal limits will improve     Medication Management: Evaluate patient's response, side effects, and tolerance of medication regimen.  Therapeutic Interventions: 1 to 1 sessions, Unit Group sessions and Medication administration.  Evaluation of Outcomes: Not Met   RN Treatment Plan for Primary Diagnosis: Schizoaffective disorder, depressive type ([place]) Long Term Goal(s): Knowledge of disease and therapeutic regimen to maintain health will improve  Short Term Goals: Ability to identify and develop effective coping behaviors will improve and Compliance with prescribed medications will improve  Medication Management: RN will administer medications as ordered by provider, will assess and evaluate patient's response and provide education to patient for prescribed medication. RN will report any adverse and/or side effects to prescribing provider.  Therapeutic Interventions: 1 on 1 counseling sessions, Psychoeducation, Medication administration, Evaluate responses to treatment, Monitor vital signs and CBGs as ordered, Perform/monitor CIWA, COWS, AIMS and Fall Risk screenings as ordered, Perform wound care treatments as ordered.  Evaluation of Outcomes: Not Met   LCSW Treatment Plan for Primary Diagnosis: Schizoaffective disorder, depressive type ([place]) Long Term Goal(s): Safe transition to appropriate next level of care at discharge, Engage patient in therapeutic group  addressing interpersonal concerns.  Short Term Goals: Engage patient in aftercare planning with referrals and resources  Therapeutic Interventions: Assess for all discharge needs, 1 to 1 time with Social worker, Explore available resources and support systems, Assess for adequacy in community support network, Educate family and significant other(s) on suicide prevention, Complete Psychosocial Assessment, Interpersonal group therapy.  Evaluation of Outcomes: Not Met   Progress in Treatment: Attending groups: No. Participating in groups: No. Taking medication as prescribed: Yes. Toleration medication: Yes. Family/Significant other contact made: No, will contact:  when pt gives consent Patient understands diagnosis: No. Discussing patient identified problems/goals with staff: No. Medical problems stabilized or resolved: No. Denies suicidal/homicidal ideation: Unknown Issues/concerns per patient self-inventory: No. Other: N/A  New problem(s) identified: No, Describe:  None reported  New Short Term/Long Term Goal(s):Pt is immobile and experiencing minimal verbal communication. Pt did not attend today's meeting.  Patient Goals:  Pt is immobile and experiencing minimal verbal communication. Pt did not attend today's meeting, no goal retrieved.  Discharge Plan or Barriers: Pt will resume outpatient ECT.  Reason for Continuation of Hospitalization: Medication stabilization  Estimated Length of Stay: 5-7 days  Recreational Therapy: Patient Stressors: N/A  Patient Goal: Patient will engage in groups without prompting or encouragement from LRT x3 group sessions within 5 recreation therapy group sessions  Attendees: Patient: 02/02/2019 3:19 PM  Physician: Alethia Berthold 02/02/2019 3:19 PM  Nursing: Junita Push Manuriattui 02/02/2019 3:19 PM  RN Care Manager: 02/02/2019 3:19 PM  Social Worker: Sanjuana Kava [place] Hitchcock 02/02/2019 3:19 PM  Recreational Therapist: Roanna Epley 02/02/2019 3:19 PM   Other:  02/02/2019 3:19 PM  Other:  02/02/2019 3:19 PM  Other: 02/02/2019 3:19 PM    Scribe for Treatment Team: Yvette Rack, LCSW 02/02/2019 3:19 PM

## 2019-02-02 NOTE — H&P (Signed)
Troy DuhamelStephen Fox is an 48 y.o. male.   Chief Complaint: Patient is back to being minimally verbal and not able to offer any specific complaint HPI: Schizoaffective disorder.  Currently in a catatonic state with depressive symptoms.  Past history of response to ECT.  Good response initially to last treatment  Past Medical History:  Diagnosis Date  . Bipolar 1 disorder (HCC)   . 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 GERD (gastroesophageal reflux disease)   . Nonverbal   . Schizoaffective disorder (HCC)   . Tardive dyskinesia     History reviewed. No pertinent surgical history.  Family History  Adopted: Yes  Family history unknown: Yes   Social History:  reports that he has never smoked. He has never used smokeless tobacco. He reports that he does not drink alcohol or use drugs.  Allergies: No Known Allergies  Medications Prior to Admission  Medication Sig Dispense Refill  . atenolol (TENORMIN) 25 MG tablet Take 1 tablet (25 mg total) by mouth daily. 90 tablet 0  . busPIRone (BUSPAR) 5 MG tablet Take 1 tablet (5 mg total) by mouth 2 (two) times daily. (Patient taking differently: Take 5 mg by mouth 2 (two) times daily as needed. ) 60 tablet 0  . Cyanocobalamin (VITAMIN B 12) 500 MCG TABS Take 500 mcg by mouth daily. 90 tablet 0  . LORazepam (ATIVAN) 0.5 MG tablet Take 1 tablet (0.5 mg total) by mouth every 8 (eight) hours as needed for anxiety. 90 tablet 2  . omeprazole (PRILOSEC) 40 MG capsule Take 1 capsule (40 mg total) by mouth daily. 90 capsule 0  . paliperidone (INVEGA) 6 MG 24 hr tablet Take 1 tablet (6 mg total) by mouth daily. 90 tablet 0  . polyethylene glycol (MIRALAX) 17 g packet Take 17 g by mouth daily as needed for moderate constipation. 72 packet 0  . sertraline (ZOLOFT) 50 MG tablet Take 3 tablets (150 mg total) by mouth daily. 270 tablet 0  . traZODone (DESYREL) 50 MG tablet Take 1 tablet (50 mg total) by mouth at bedtime. 90 tablet 0  . Valbenazine Tosylate (INGREZZA) 40 &  80 MG CPPK Take 40 mg by mouth daily for 7 days, THEN 80 mg daily for 30 days. 30 each 0  . benztropine (COGENTIN) 1 MG tablet Take 1 tablet (1 mg total) by mouth 2 (two) times daily for 7 days. 14 tablet 0    Results for orders placed or performed during the hospital encounter of 01/29/19 (from the past 48 hour(s))  Glucose, capillary     Status: Abnormal   Collection Time: 02/02/19  6:38 AM  Result Value Ref Range   Glucose-Capillary 103 (H) 70 - 99 mg/dL   No results found.  Review of Systems  Unable to perform ROS: Psychiatric disorder    Blood pressure 125/87, pulse 86, temperature 98 F (36.7 C), temperature source Oral, resp. rate 16, height 5\' 5"  (1.651 m), weight 63.5 kg, SpO2 96 %. Physical Exam  Nursing note and vitals reviewed. Constitutional: He appears well-developed and well-nourished.  HENT:  Head: Normocephalic and atraumatic.  Eyes: Pupils are equal, round, and reactive to light. Conjunctivae are normal.  Neck: Normal range of motion.  Cardiovascular: Regular rhythm and normal heart sounds.  Respiratory: Effort normal. No respiratory distress.  GI: Soft.  Musculoskeletal: Normal range of motion.  Neurological: He is alert.  Skin: Skin is warm and dry.  Psychiatric: His affect is blunt. He is noncommunicative.     Assessment/Plan  Patient will get his second bilateral treatment today.  Monday is the holiday and we are not able to schedule treatment so we will follow-up then on Wednesday and continue the inpatient course.  Mordecai Rasmussen, MD 02/02/2019, 9:47 AM

## 2019-02-02 NOTE — Progress Notes (Signed)
1:1 Safety observation 0400: Pt is resting in bed with eyes closed. Respirations are even and unlabored. No distress is noted. Will continue to monitor 1:1 for safety by physician order.   0500: Pt is resting in bed with eyes closed. Respirations are even and unlabored. No distress is noted. Will continue to monitor 1:1 for safety by physician order.   0600: Pt is resting in bed with eyes closed. Respirations are even and unlabored. No distress is noted. Will continue to monitor 1:1 for safety by physician order.   0700: Pt is resting in bed with eyes closed. Respirations are even and unlabored. No distress is noted. Will continue to monitor 1:1 for safety by physician order.

## 2019-02-02 NOTE — Transfer of Care (Signed)
Immediate Anesthesia Transfer of Care Note  Patient: Troy Fox  Procedure(s) Performed: ECT TX  Patient Location: PACU  Anesthesia Type:General  Level of Consciousness: awake and alert   Airway & Oxygen Therapy: Patient Spontanous Breathing and Patient connected to face mask oxygen  Post-op Assessment: Report given to RN and Post -op Vital signs reviewed and stable  Post vital signs: Reviewed and stable  Last Vitals:  Vitals Value Taken Time  BP 125/74 02/02/2019 10:30 AM  Temp 36.8 C 02/02/2019 10:30 AM  Pulse 95 02/02/2019 10:30 AM  Resp 18 02/02/2019 10:30 AM  SpO2 99 % 02/02/2019 10:30 AM    Last Pain:  Vitals:   02/02/19 0928  TempSrc: Oral  PainSc:          Pt catatonic preop Complications: No apparent anesthesia complications

## 2019-02-02 NOTE — Procedures (Signed)
ECT SERVICES Physician's Interval Evaluation & Treatment Note  Patient Identification: Troy Fox MRN:  [id number] Date of Evaluation:  02/02/2019 TX #: 2  MADRS:   MMSE:   P.E. Findings:  Patient continues to be withdrawn showing signs of atrophy and malnutrition but no new physical complaints  Psychiatric Interval Note:  Catatonic no communication  Subjective:  Patient is a 48 y.o. male seen for evaluation for Electroconvulsive Therapy. Unable to provide information  Treatment Summary:   []   Right Unilateral             [x]  Bilateral   % Energy : 1.0 ms 60%   Impedance: 1450 ohms  Seizure Energy Index: 24,140 V squared  Postictal Suppression Index: 90%  Seizure Concordance Index: 96%  Medications  Pre Shock: Brevital 70 mg succinylcholine 90 mg  Post Shock: None  Seizure Duration: 18 seconds by EMG 23 seconds by EEG   Comments: Despite doubling the stimulus he is still running on the short side.  We will go up to 100% next time and might even cut down on the anesthesia dose.  Next treatment for Wednesday.  Lungs:  [x]   Clear to auscultation               []  Other:   Heart:    [x]   Regular rhythm             []  irregular rhythm    [x]   Previous H&P reviewed, patient examined and there are NO CHANGES                 []   Previous H&P reviewed, patient examined and there are changes noted.   Troy Rasmussen, MD 5/22/20204:52 PM

## 2019-02-02 NOTE — [facility] Group Notes (Signed)
Feelings Around Relapse 02/02/2019 1PM  Type of Therapy and Topic:  Group Therapy:  Feelings around Relapse and Recovery  Participation Level:  Did Not Attend   Description of Group:    Patients in this group will discuss emotions they experience before and after a relapse. They will process how experiencing these feelings, or avoidance of experiencing them, relates to having a relapse. Facilitator will guide patients to explore emotions they have related to recovery. Patients will be encouraged to process which emotions are more powerful. They will be guided to discuss the emotional reaction significant others in their lives may have to patients' relapse or recovery. Patients will be assisted in exploring ways to respond to the emotions of others without this contributing to a relapse.  Therapeutic Goals: 1. Patient will identify two or more emotions that lead to a relapse for them 2. Patient will identify two emotions that result when they relapse 3. Patient will identify two emotions related to recovery 4. Patient will demonstrate ability to communicate their needs through discussion and/or role plays   Summary of Patient Progress:     Therapeutic Modalities:   Cognitive Behavioral Therapy Solution-Focused Therapy Assertiveness Training Relapse Prevention Therapy   Iban Utz T Teandra Harlan, LCSW 02/02/2019 1:52 PM   

## 2019-02-02 NOTE — Progress Notes (Signed)
Report was received from PACU stating that patient did well and has been verbal with the nurse, no issues were reported to this Clinical research associate.

## 2019-02-02 NOTE — Anesthesia Preprocedure Evaluation (Addendum)
Anesthesia Evaluation  Patient identified by MRN, date of birth, ID band Patient awake    Reviewed: Allergy & Precautions, NPO status , Patient's Chart, lab work & pertinent test results  History of Anesthesia Complications Negative for: history of anesthetic complications  Airway Mallampati: II  TM Distance: >3 FB     Dental  (+) Poor Dentition, Chipped   Pulmonary neg pulmonary ROS, neg shortness of breath,    Pulmonary exam normal        Cardiovascular Exercise Tolerance: Good (-) angina(-) Past MI and (-) DOE negative cardio ROS Normal cardiovascular exam     Neuro/Psych PSYCHIATRIC DISORDERS Depression Bipolar Disorder Schizophrenia Currently nonverbalnegative neurological ROS     GI/Hepatic Neg liver ROS, GERD  ,  Endo/Other  negative endocrine ROS  Renal/GU negative Renal ROS  negative genitourinary   Musculoskeletal negative musculoskeletal ROS (+)   Abdominal Normal abdominal exam  (+)   Peds negative pediatric ROS (+)  Hematology negative hematology ROS (+)   Anesthesia Other Findings Past Medical History: No date: Bipolar 1 disorder (HCC) No date: 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HCC) No date: GERD (gastroesophageal reflux disease) No date: Nonverbal No date: Schizoaffective disorder (HCC) No date: Tardive dyskinesia  Reproductive/Obstetrics                            Anesthesia Physical  Anesthesia Plan  ASA: III  Anesthesia Plan: General   Post-op Pain Management:    Induction: Intravenous  PONV Risk Score and Plan: TIVA  Airway Management Planned: Mask  Additional Equipment:   Intra-op Plan:   Post-operative Plan:   Informed Consent: I have reviewed the patients History and Physical, chart, labs and discussed the procedure including the risks, benefits and alternatives for the proposed anesthesia with the patient or authorized representative who has indicated  his/her understanding and acceptance.     Dental advisory given  Plan Discussed with: CRNA and Surgeon  Anesthesia Plan Comments: (Patient consented for risks of anesthesia including but not limited to:  - adverse reactions to medications - risk of intubation if required - damage to teeth, lips or other oral mucosa - sore throat or hoarseness - Damage to heart, brain, lungs or loss of life  Patient voiced understanding.)       Anesthesia Quick Evaluation

## 2019-02-02 NOTE — Progress Notes (Signed)
1:1 Patient Hourly Rounding:  0800: Patient is asleep in his room with his assigned safety sitter present at bedside.  1200: Patient is in bed, asleep, with his assigned safety sitter present at bedside.  1600: Patient is sitting in his bed with his assigned safety sitter present.

## 2019-02-02 NOTE — Progress Notes (Signed)
[facility] MD Progress Note  02/02/2019 4:22 PM Min Collymore  MRN:  [id number] Subjective: Patient seen chart reviewed.  Patient had ECT treatment this morning.  The ECT treatment was without any complication.  Briefly after treatment patient was a little more animated and talking but by this afternoon he was back to not speaking and being very withdrawn.  Would not follow any direct instructions.  With nursing assistance he can still occasionally go to the bathroom.  He still follows people with his eyes but does not seem to be making an effort to engage in any conversation. Principal Problem: Schizoaffective disorder, depressive type (HCC) Diagnosis: Principal Problem:   Schizoaffective disorder, depressive type (HCC) Active Problems:   Catatonia   Mild malnutrition (HCC)  Total Time spent with patient: 30 minutes  Past Psychiatric History: Patient has a history of chronic mental illness with this current episode of depression and catatonia present for several months  Past Medical History:  Past Medical History:  Diagnosis Date  . Bipolar 1 disorder (HCC)   . 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 GERD (gastroesophageal reflux disease)   . Nonverbal   . Schizoaffective disorder (HCC)   . Tardive dyskinesia    History reviewed. No pertinent surgical history. Family History:  Family History  Adopted: Yes  Family history unknown: Yes   Family Psychiatric  History: See previous Social History:  Social History   Substance and Sexual Activity  Alcohol Use Never  . Frequency: Never     Social History   Substance and Sexual Activity  Drug Use Never    Social History   Socioeconomic History  . Marital status: Single    Spouse name: Not on file  . Number of children: Not on file  . Years of education: Not on file  . Highest education level: Not on file  Occupational History  . Not on file  Social Needs  . Financial resource strain: Not on file  . Food insecurity:    Worry: Not on  file    Inability: Not on file  . Transportation needs:    Medical: Not on file    Non-medical: Not on file  Tobacco Use  . Smoking status: Never Smoker  . Smokeless tobacco: Never Used  Substance and Sexual Activity  . Alcohol use: Never    Frequency: Never  . Drug use: Never  . Sexual activity: Not Currently  Lifestyle  . Physical activity:    Days per week: Not on file    Minutes per session: Not on file  . Stress: Not on file  Relationships  . Social connections:    Talks on phone: Not on file    Gets together: Not on file    Attends religious service: Not on file    Active member of club or organization: Not on file    Attends meetings of clubs or organizations: Not on file    Relationship status: Not on file  Other Topics Concern  . Not on file  Social History Narrative  . Not on file   Additional Social History:    Pain Medications: please see mar Prescriptions: please see mar Over the Counter: please see mar History of alcohol / drug use?: No history of alcohol / drug abuse Longest period of sobriety (when/how long): NA                    Sleep: Fair  Appetite:  Poor  Current Medications: Current Facility-Administered Medications  Medication Dose Route Frequency Provider Last Rate Last Dose  . acetaminophen (TYLENOL) tablet 650 mg  650 mg Oral Q6H PRN ,  T, MD      . alum & mag hydroxide-simeth (MAALOX/MYLANTA) 200-200-20 MG/5ML suspension 30 mL  30 mL Oral Q4H PRN ,  T, MD      . fentaNYL (SUBLIMAZE) injection 25 mcg  25 mcg Intravenous Q5 min PRN Yves Dillarroll, Paul, MD      . lactated ringers infusion   Intravenous Continuous , Jackquline Denmark T, MD 10 mL/hr at 01/31/19 1024    . magnesium hydroxide (MILK OF MAGNESIA) suspension 30 mL  30 mL Oral Daily PRN ,  T, MD      . ondansetron (ZOFRAN) injection 4 mg  4 mg Intravenous Once PRN Yves Dillarroll, Paul, MD      . pantoprazole (PROTONIX) EC tablet 40 mg  40 mg Oral Daily  , Jackquline Denmark T, MD   40 mg at 02/02/19 1240  . polyethylene glycol (MIRALAX / GLYCOLAX) packet 17 g  17 g Oral Daily , Jackquline Denmark T, MD   17 g at 02/01/19 0809  . traZODone (DESYREL) tablet 100 mg  100 mg Oral QHS , Jackquline Denmark T, MD   100 mg at 02/01/19 2139  . vitamin B-12 (CYANOCOBALAMIN) tablet 1,000 mcg  1,000 mcg Oral Daily , Jackquline Denmark T, MD   1,000 mcg at 02/02/19 1239    Lab Results:  Results for orders placed or performed during the hospital encounter of 01/29/19 (from the past 48 hour(s))  Glucose, capillary     Status: Abnormal   Collection Time: 02/02/19  6:38 AM  Result Value Ref Range   Glucose-Capillary 103 (H) 70 - 99 mg/dL    Blood Alcohol level:  Lab Results  Component Value Date   ETH <10 01/24/2019    Metabolic Disorder Labs: Lab Results  Component Value Date   HGBA1C 4.9 01/30/2019   MPG 93.93 01/30/2019   No results found for: PROLACTIN No results found for: CHOL, TRIG, HDL, CHOLHDL, VLDL, LDLCALC  Physical Findings: AIMS: Facial and Oral Movements Muscles of Facial Expression: None, normal Lips and Perioral Area: None, normal Jaw: None, normal Tongue: None, normal,Extremity Movements Upper (arms, wrists, hands, fingers): None, normal Lower (legs, knees, ankles, toes): None, normal, Trunk Movements Neck, shoulders, hips: None, normal, Overall Severity Severity of abnormal movements (highest score from questions above): None, normal Incapacitation due to abnormal movements: None, normal Patient's awareness of abnormal movements (rate only patient's report): No Awareness, Dental Status Current problems with teeth and/or dentures?: No Does patient usually wear dentures?: No  CIWA:    COWS:     Musculoskeletal: Strength & Muscle Tone: decreased Gait & Station: unsteady Patient leans: N/A  Psychiatric Specialty Exam: Physical Exam  Nursing note and vitals reviewed. Constitutional: He appears well-developed and well-nourished.  HENT:  Head:  Normocephalic and atraumatic.  Eyes: Pupils are equal, round, and reactive to light. Conjunctivae are normal.  Neck: Normal range of motion.  Cardiovascular: Regular rhythm and normal heart sounds.  Respiratory: Effort normal. No respiratory distress.  GI: Soft.  Musculoskeletal: Normal range of motion.  Neurological: He is alert.  Skin: Skin is warm and dry.  Psychiatric: His affect is blunt. He is noncommunicative.    Review of Systems  Unable to perform ROS: Psychiatric disorder    Blood pressure 135/82, pulse 78, temperature 97.9 F (36.6 C), temperature source Oral, resp. rate 16, height 5\' 5"  (1.651 m), weight 63.5 kg, SpO2 96 %.Body  mass index is 23.3 kg/m.  General Appearance: Casual  Eye Contact:  Minimal  Speech:  Negative  Volume:  Decreased  Mood:  Negative  Affect:  Negative  Thought Process:  NA  Orientation:  Negative  Thought Content:  Negative  Suicidal Thoughts:  No  Homicidal Thoughts:  No  Memory:  Negative  Judgement:  Negative  Insight:  Negative  Psychomotor Activity:  Negative  Concentration:  Concentration: Negative  Recall:  Negative  Fund of Knowledge:  Negative  Language:  Negative  Akathisia:  Negative  Handed:  Right  AIMS (if indicated):     Assets:  Health and safety inspector Housing Physical Health Resilience Social Support  ADL's:  Impaired  Cognition:  Impaired,  Moderate and Severe  Sleep:  Number of Hours: 6.25     Treatment Plan Summary: Plan Primary mode of treatment remains ECT.  Unfortunately treatment will not happen on Monday due to the holiday.  Continue current medication.  I am going to continue to stay off of the Ativan for now although if he becomes even more withdrawn that can be reinitiated.  His seizure was short again today although it was over 20 seconds.  I would like to not do anything that is going to make the seizures more difficult.  Case reviewed with nursing and social work.  Fortunately patient still  has a very safe and established place to go after hospitalization.  Mordecai Rasmussen, MD 02/02/2019, 4:22 PM

## 2019-02-02 NOTE — Anesthesia Procedure Notes (Signed)
Procedure Name: General with mask airway Performed by: Ginger Carne, CRNA Pre-anesthesia Checklist: Patient identified, Emergency Drugs available, Suction available, Patient being monitored and Timeout performed Patient Re-evaluated:Patient Re-evaluated prior to induction Oxygen Delivery Method: Circle system utilized Preoxygenation: Pre-oxygenation with 100% oxygen Induction Type: IV induction Ventilation: Mask ventilation without difficulty

## 2019-02-03 NOTE — [facility] Group Notes (Signed)
LCSW Group Therapy Note   02/03/2019 1:15pm   Type of Therapy and Topic:  Group Therapy:  Trust and Honesty  Participation Level:  Did Not Attend  Description of Group:    In this group patients will be asked to explore the value of being honest.  Patients will be guided to discuss their thoughts, feelings, and behaviors related to honesty and trusting in others. Patients will process together how trust and honesty relate to forming relationships with peers, family members, and self. Each patient will be challenged to identify and express feelings of being vulnerable. Patients will discuss reasons why people are dishonest and identify alternative outcomes if one was truthful (to self or others). This group will be process-oriented, with patients participating in exploration of their own experiences, giving and receiving support, and processing challenge from other group members.   Therapeutic Goals: 1. Patient will identify why honesty is important to relationships and how honesty overall affects relationships.  2. Patient will identify a situation where they lied or were lied too and the  feelings, thought process, and behaviors surrounding the situation 3. Patient will identify the meaning of being vulnerable, how that feels, and how that correlates to being honest with self and others. 4. Patient will identify situations where they could have told the truth, but instead lied and explain reasons of dishonesty.   Summary of Patient Progress Pt was invited to attend group but chose not to attend. CSW will continue to encourage pt to attend group throughout their admission.     Therapeutic Modalities:   Cognitive Behavioral Therapy Solution Focused Therapy Motivational Interviewing Brief Therapy  Hermila Millis  CUEBAS-COLON, LCSW 02/03/2019 12:38 PM

## 2019-02-03 NOTE — Progress Notes (Signed)
Patient has been in room awake, sitter at bedside. No sign of distress.

## 2019-02-03 NOTE — Progress Notes (Signed)
Patient continues to progress, making more coherent verbalizations and following simple commands. With coaching he does cooperate with requests to do various activities.

## 2019-02-03 NOTE — Final Progress Note (Signed)
Patient continues with ability to shift in bed. Assisting with transfers from bed chair. Ambulates with assistance. Continues with1:1 monitoring and support.

## 2019-02-03 NOTE — Progress Notes (Signed)
Patient currently in bed awake. Calm and cooperative. No sign of distress.  Safety maintained on 1:1 observations.

## 2019-02-03 NOTE — Progress Notes (Signed)
Patient received his bedtime medication. Continues to experience delay in responding to verbal communications. Currently in bed. Staff continue to monitor on 1:1 for safety.

## 2019-02-03 NOTE — Plan of Care (Signed)
Patient currently on 1:1 observation. Responsiveness was difficult to rate, coaxing was effective in some cooperation with medicine and foods. Persistent urinary incontinence requiring frequent changes. Verbals are minimal but does make eye contact. Remained in bed today with sitter present. Skin intact, respiratory effort adequate.  Will continue to monitor. Problem: Coping: Goal: Ability to verbalize frustrations and anger appropriately will improve Outcome: Progressing Goal: Ability to demonstrate self-control will improve Outcome: Progressing   Problem: Health Behavior/Discharge Planning: Goal: Identification of resources available to assist in meeting health care needs will improve Outcome: Progressing   Problem: Physical Regulation: Goal: Ability to maintain clinical measurements within normal limits will improve Outcome: Progressing

## 2019-02-03 NOTE — Progress Notes (Signed)
Patient in room, awake. Withdrawn, with slow responses upon approach. No aggressive behaviors, no sign of distress. Remains on 1:1 for fall precautions.

## 2019-02-03 NOTE — Progress Notes (Signed)
Patient currently resting with eyes open but encouraged to turn off light and try sleeping. He was repositioned to help prevent development of pressure ulcers. Lights are currently off to help him get to sleep. He took a few sips of the juice at his bedside without problems. Peripheral pulses easily palpable, respirations unlabored and even. Will continue to monitor and adjust treatment as ordered/indicated.

## 2019-02-03 NOTE — Progress Notes (Signed)
[facility]  02/03/2019 10:42 AM Troy DuhamelStephen Fox  MRN:  [id number] Subjective: Patient seen chart reviewed.  Patient was also discussed with his treatment team.  They report that he was put on one-to-one for a fall risk.  He has been eating okay.  He continues to shuffle when he walks and had become fall risk.  He is responding slowly to ECT.  He had any ECT treatment yesterday.  This treatment went without any complications.  Patient very slow to respond and just looks at the clinician mostly.  Is very withdrawn.  He does make fair eye contact.  Not engaging in any conversation.    Principal Problem: Schizoaffective disorder, depressive type (HCC) Diagnosis: Principal Problem:   Schizoaffective disorder, depressive type (HCC) Active Problems:   Catatonia   Mild malnutrition (HCC)  Total Time spent with patient: 30 minutes  Past Psychiatric History: Patient has a history of chronic mental illness with this current episode of depression and catatonia present for several months  Past Medical History:  Past Medical History:  Diagnosis Date  . Bipolar 1 disorder (HCC)   . 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 GERD (gastroesophageal reflux disease)   . Nonverbal   . Schizoaffective disorder (HCC)   . Tardive dyskinesia    History reviewed. No pertinent surgical history. Family History:  Family History  Adopted: Yes  Family history unknown: Yes   Family Psychiatric  History: See previous Social History:  Social History   Substance and Sexual Activity  Alcohol Use Never  . Frequency: Never     Social History   Substance and Sexual Activity  Drug Use Never    Social History   Socioeconomic History  . Marital status: Single    Spouse name: Not on file  . Number of children: Not on file  . Years of education: Not on file  . Highest education level: Not on file  Occupational History  . Not on file  Social Needs  . Financial resource strain: Not on file  . Food  insecurity:    Worry: Not on file    Inability: Not on file  . Transportation needs:    Medical: Not on file    Non-medical: Not on file  Tobacco Use  . Smoking status: Never Smoker  . Smokeless tobacco: Never Used  Substance and Sexual Activity  . Alcohol use: Never    Frequency: Never  . Drug use: Never  . Sexual activity: Not Currently  Lifestyle  . Physical activity:    Days per week: Not on file    Minutes per session: Not on file  . Stress: Not on file  Relationships  . Social connections:    Talks on phone: Not on file    Gets together: Not on file    Attends religious service: Not on file    Active member of club or organization: Not on file    Attends meetings of clubs or organizations: Not on file    Relationship status: Not on file  Other Topics Concern  . Not on file  Social History Narrative  . Not on file   Additional Social History:    Pain Medications: please see mar Prescriptions: please see mar Over the Counter: please see mar History of alcohol / drug use?: No history of alcohol / drug abuse Longest period of sobriety (when/how long): NA  Sleep: Fair  Appetite:  Poor  Current Medications: Current Facility-Administered Medications  Medication Dose Route Frequency Provider Last Rate Last Dose  . acetaminophen (TYLENOL) tablet 650 mg  650 mg Oral Q6H PRN Clapacs, John T, MD      . alum & mag hydroxide-simeth (MAALOX/MYLANTA) 200-200-20 MG/5ML suspension 30 mL  30 mL Oral Q4H PRN Clapacs, John T, MD      . asenapine (SAPHRIS) sublingual tablet 5 mg  5 mg Sublingual BID Clapacs, Jackquline Denmark, MD   5 mg at 02/03/19 0817  . magnesium hydroxide (MILK OF MAGNESIA) suspension 30 mL  30 mL Oral Daily PRN Clapacs, John T, MD      . ondansetron (ZOFRAN) injection 4 mg  4 mg Intravenous Once PRN Yves Dill, MD      . pantoprazole (PROTONIX) EC tablet 40 mg  40 mg Oral Daily Clapacs, Jackquline Denmark, MD   40 mg at 02/03/19 0817  . polyethylene  glycol (MIRALAX / GLYCOLAX) packet 17 g  17 g Oral Daily Clapacs, Jackquline Denmark, MD   17 g at 02/03/19 0817  . traZODone (DESYREL) tablet 100 mg  100 mg Oral QHS Clapacs, John T, MD   100 mg at 02/02/19 2140  . vitamin B-12 (CYANOCOBALAMIN) tablet 1,000 mcg  1,000 mcg Oral Daily Clapacs, Jackquline Denmark, MD   1,000 mcg at 02/03/19 1191    Lab Results:  Results for orders placed or performed during the hospital encounter of 01/29/19 (from the past 48 hour(s))  Glucose, capillary     Status: Abnormal   Collection Time: 02/02/19  6:38 AM  Result Value Ref Range   Glucose-Capillary 103 (H) 70 - 99 mg/dL    Blood Alcohol level:  Lab Results  Component Value Date   ETH <10 01/24/2019    Metabolic Disorder Labs: Lab Results  Component Value Date   HGBA1C 4.9 01/30/2019   MPG 93.93 01/30/2019   No results found for: PROLACTIN No results found for: CHOL, TRIG, HDL, CHOLHDL, VLDL, LDLCALC  Physical Findings: AIMS: Facial and Oral Movements Muscles of Facial Expression: None, normal Lips and Perioral Area: None, normal Jaw: None, normal Tongue: None, normal,Extremity Movements Upper (arms, wrists, hands, fingers): None, normal Lower (legs, knees, ankles, toes): None, normal, Trunk Movements Neck, shoulders, hips: None, normal, Overall Severity Severity of abnormal movements (highest score from questions above): None, normal Incapacitation due to abnormal movements: None, normal Patient's awareness of abnormal movements (rate only patient's report): No Awareness, Dental Status Current problems with teeth and/or dentures?: No Does patient usually wear dentures?: No  CIWA:    COWS:     Musculoskeletal: Strength & Muscle Tone: decreased Gait & Station: unsteady Patient leans: N/A  Psychiatric Specialty Exam: Physical Exam  Nursing note and vitals reviewed. Constitutional: He appears well-developed and well-nourished.  HENT:  Head: Normocephalic and atraumatic.  Eyes: Pupils are equal, round,  and reactive to light. Conjunctivae are normal.  Neck: Normal range of motion.  Cardiovascular: Regular rhythm and normal heart sounds.  Respiratory: Effort normal. No respiratory distress.  GI: Soft.  Musculoskeletal: Normal range of motion.  Neurological: He is alert.  Skin: Skin is warm and dry.  Psychiatric: His affect is blunt. He is noncommunicative.    Review of Systems  Unable to perform ROS: Psychiatric disorder    Blood pressure (!) 147/89, pulse (!) 115, temperature 97.9 F (36.6 C), temperature source Oral, resp. rate 17, height  (1.651 m), weight 63.5 kg, SpO2 95 %.Body mass index  is 23.3 kg/m.  General Appearance: Casual  Eye Contact:  Minimal  Speech:  Negative  Volume:  Decreased  Mood:  Negative  Affect:  Negative  Thought Process:  NA  Orientation:  Negative  Thought Content:  Negative  Suicidal Thoughts:  No  Homicidal Thoughts:  No  Memory:  Negative  Judgement:  Negative  Insight:  Negative  Psychomotor Activity:  Negative  Concentration:  Concentration: Negative  Recall:  Negative  Fund of Knowledge:  Negative  Language:  Negative  Akathisia:  Negative  Handed:  Right  AIMS (if indicated):     Assets:  Health and safety inspector Housing Physical Health Resilience Social Support  ADL's:  Impaired  Cognition:  Impaired,  Moderate and Severe  Sleep:  Number of Hours: 1.5     Treatment Plan Summary: Patient is a 48 year old male with a long history of refractory depression.  He has been started on ECT treatment with his primary psychiatrist Dr. Toni Amend.  He did obtain a treatment yesterday which went without complications.  Patient is responding slowly.  We will continue his current medications. Continue the one-to-one for fall risk. Patient's blood pressure slightly elevated and we will continue to monitor.  Patrick North, MD 02/03/2019, 10:42 AM

## 2019-02-03 NOTE — Progress Notes (Signed)
Patient awake and restless, able to ambulate and comply with requests for accepting vital signs monitor. Seems more alert and cooperation improved. Continues to have difficulty processing requests for changing clothing (scrubs too long to assure no falls for injuries. Will continue to monitor and adjust treatment as ordered/indicated.

## 2019-02-03 NOTE — Progress Notes (Signed)
Patient observed to ambulate in his room without difficulty. Makes verbalizations infrequently especially as they concern "being in trouble." He has been given reassurances that everything is okay and that he is not in trouble but he has that persistent concern.

## 2019-02-03 NOTE — Progress Notes (Signed)
Patient is assessed and chart reviewed. Patient will required continued 1:1 status. Patient is unsteady and will require assistance with walking. Patient is a High Fall Risk.

## 2019-02-03 NOTE — Progress Notes (Signed)
Patient able to ambulate in room and transfer to chair to obtain vital signs. Vital signs stable, respirations clear, peripheral pulses palpable.

## 2019-02-03 NOTE — Progress Notes (Signed)
Patient is 1:1 for safety and require assistance with walking. Patient is incontinent of urine.  Patient is slow moving and is minimal in interaction. Patient is adherent with scheduled medication but requires reinforcement. Patient denies any SI/HI at this time but I am unable to assess patient for AVH. Patient is not seen interacting with internal stimuli. Patient reports 8/10 depression and 8/10 hopelessness on self inventory sheet. No goal is listed for today.

## 2019-02-04 NOTE — Progress Notes (Signed)
Patient remains asleep. No sign of discomfort. Safety maintained on 1:1.

## 2019-02-04 NOTE — [facility] Group Notes (Signed)
LCSW Group Therapy Note 02/04/2019 1:15pm  Type of Therapy and Topic: Group Therapy: Feelings Around Returning Home & Establishing a Supportive Framework and Supporting Oneself When Supports Not Available  Participation Level: Did Not Attend  Description of Group:  Patients first processed thoughts and feelings about upcoming discharge. These included fears of upcoming changes, lack of change, new living environments, judgements and expectations from others and overall stigma of mental health issues. The group then discussed the definition of a supportive framework, what that looks and feels like, and how do to discern it from an unhealthy non-supportive network. The group identified different types of supports as well as what to do when your family/friends are less than helpful or unavailable  Therapeutic Goals  1. Patient will identify one healthy supportive network that they can use at discharge. 2. Patient will identify one factor of a supportive framework and how to tell it from an unhealthy network. 3. Patient able to identify one coping skill to use when they do not have positive supports from others. 4. Patient will demonstrate ability to communicate their needs through discussion and/or role plays.  Summary of Patient Progress:  Pt was invited to attend group but chose not to attend. CSW will continue to encourage pt to attend group throughout their admission.   Therapeutic Modalities Cognitive Behavioral Therapy Motivational Interviewing   Jamila Slatten  CUEBAS-COLON, LCSW 02/04/2019 12:01 PM

## 2019-02-04 NOTE — Progress Notes (Signed)
Remains asleep, sitter at bedtime.

## 2019-02-04 NOTE — Plan of Care (Signed)
Patient is mute and not communicating with this Clinical research associate nor his assigned Recruitment consultant. Patient did not take his morning medication and is not eating much today, he has only taken in a small amount of fluids.  Problem: Education: Goal: Knowledge of [place] [facility] Education information/materials will improve Outcome: Not Progressing Goal: Emotional status will improve Outcome: Not Progressing Goal: Mental status will improve Outcome: Not Progressing Goal: Verbalization of understanding the information provided will improve Outcome: Not Progressing

## 2019-02-04 NOTE — Progress Notes (Signed)
Patient has remained asleep, sitter at bedside. No sign of discomfort.

## 2019-02-04 NOTE — Plan of Care (Signed)
Experiencing slowed thought process. On 1:1 for fall risks.

## 2019-02-04 NOTE — Progress Notes (Signed)
1:1 Patient Hourly Rounding  1000: Patient is laying in his bed with his assigned safety sitter present at bedside.  1400: Patient is laying in bed, awake, with his assigned safety sitter present at bedside.  1800: Patient continues to lay in his bed with his assigned safety sitter present.

## 2019-02-04 NOTE — Progress Notes (Signed)
[facility] MD Progress Note  02/04/2019 11:08 AM Troy Fox  MRN:  [id number] Subjective: Patient seen chart reviewed.  Patient was also discussed with his treatment team. Staff report patient has been more catatonic over the past day. He has not been talking, refusing to get out of bed, not eating much. On one-to-one for a fall risk.  He is responding slowly to ECT. Patient very slow to respond and just looks at the clinician mostly.  Is very withdrawn.  He does make fair eye contact.  Not engaging in any conversation.    Principal Problem: Schizoaffective disorder, depressive type (HCC) Diagnosis: Principal Problem:   Schizoaffective disorder, depressive type (HCC) Active Problems:   Catatonia   Mild malnutrition (HCC)  Total Time spent with patient: 30 minutes  Past Psychiatric History: Patient has a history of chronic mental illness with this current episode of depression and catatonia present for several months  Past Medical History:  Past Medical History:  Diagnosis Date  . Bipolar 1 disorder (HCC)   . 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 GERD (gastroesophageal reflux disease)   . Nonverbal   . Schizoaffective disorder (HCC)   . Tardive dyskinesia    History reviewed. No pertinent surgical history. Family History:  Family History  Adopted: Yes  Family history unknown: Yes   Family Psychiatric  History: See previous Social History:  Social History   Substance and Sexual Activity  Alcohol Use Never  . Frequency: Never     Social History   Substance and Sexual Activity  Drug Use Never    Social History   Socioeconomic History  . Marital status: Single    Spouse name: Not on file  . Number of children: Not on file  . Years of education: Not on file  . Highest education level: Not on file  Occupational History  . Not on file  Social Needs  . Financial resource strain: Not on file  . Food insecurity:    Worry: Not on file    Inability: Not on file  . Transportation  needs:    Medical: Not on file    Non-medical: Not on file  Tobacco Use  . Smoking status: Never Smoker  . Smokeless tobacco: Never Used  Substance and Sexual Activity  . Alcohol use: Never    Frequency: Never  . Drug use: Never  . Sexual activity: Not Currently  Lifestyle  . Physical activity:    Days per week: Not on file    Minutes per session: Not on file  . Stress: Not on file  Relationships  . Social connections:    Talks on phone: Not on file    Gets together: Not on file    Attends religious service: Not on file    Active member of club or organization: Not on file    Attends meetings of clubs or organizations: Not on file    Relationship status: Not on file  Other Topics Concern  . Not on file  Social History Narrative  . Not on file   Additional Social History:    Pain Medications: please see mar Prescriptions: please see mar Over the Counter: please see mar History of alcohol / drug use?: No history of alcohol / drug abuse Longest period of sobriety (when/how long): NA                    Sleep: Fair  Appetite:  Poor  Current Medications: Current Facility-Administered Medications  Medication Dose Route  Frequency Provider Last Rate Last Dose  . acetaminophen (TYLENOL) tablet 650 mg  650 mg Oral Q6H PRN Clapacs, John T, MD      . alum & mag hydroxide-simeth (MAALOX/MYLANTA) 200-200-20 MG/5ML suspension 30 mL  30 mL Oral Q4H PRN Clapacs, John T, MD      . asenapine (SAPHRIS) sublingual tablet 5 mg  5 mg Sublingual BID Clapacs, Jackquline DenmarkJohn T, MD   5 mg at 02/03/19 1749  . magnesium hydroxide (MILK OF MAGNESIA) suspension 30 mL  30 mL Oral Daily PRN Clapacs, John T, MD      . ondansetron (ZOFRAN) injection 4 mg  4 mg Intravenous Once PRN Yves Dillarroll, Paul, MD      . pantoprazole (PROTONIX) EC tablet 40 mg  40 mg Oral Daily Clapacs, Jackquline DenmarkJohn T, MD   40 mg at 02/03/19 0817  . polyethylene glycol (MIRALAX / GLYCOLAX) packet 17 g  17 g Oral Daily Clapacs, Jackquline DenmarkJohn T, MD   17  g at 02/03/19 0817  . traZODone (DESYREL) tablet 100 mg  100 mg Oral QHS Clapacs, John T, MD   100 mg at 02/03/19 2210  . vitamin B-12 (CYANOCOBALAMIN) tablet 1,000 mcg  1,000 mcg Oral Daily Clapacs, Jackquline DenmarkJohn T, MD   1,000 mcg at 02/03/19 16100817    Lab Results:  No results found for this or any previous visit (from the past 48 hour(s)).  Blood Alcohol level:  Lab Results  Component Value Date   ETH <10 01/24/2019    Metabolic Disorder Labs: Lab Results  Component Value Date   HGBA1C 4.9 01/30/2019   MPG 93.93 01/30/2019   No results found for: PROLACTIN No results found for: CHOL, TRIG, HDL, CHOLHDL, VLDL, LDLCALC  Physical Findings: AIMS: Facial and Oral Movements Muscles of Facial Expression: None, normal Lips and Perioral Area: None, normal Jaw: None, normal Tongue: None, normal,Extremity Movements Upper (arms, wrists, hands, fingers): None, normal Lower (legs, knees, ankles, toes): None, normal, Trunk Movements Neck, shoulders, hips: None, normal, Overall Severity Severity of abnormal movements (highest score from questions above): None, normal Incapacitation due to abnormal movements: None, normal Patient's awareness of abnormal movements (rate only patient's report): No Awareness, Dental Status Current problems with teeth and/or dentures?: No Does patient usually wear dentures?: No  CIWA:    COWS:     Musculoskeletal: Strength & Muscle Tone: decreased Gait & Station: unsteady Patient leans: N/A  Psychiatric Specialty Exam: Physical Exam  Nursing note and vitals reviewed. Constitutional: He appears well-developed and well-nourished.  HENT:  Head: Normocephalic and atraumatic.  Eyes: Pupils are equal, round, and reactive to light. Conjunctivae are normal.  Neck: Normal range of motion.  Cardiovascular: Regular rhythm and normal heart sounds.  Respiratory: Effort normal. No respiratory distress.  GI: Soft.  Musculoskeletal: Normal range of motion.  Neurological:  He is alert.  Skin: Skin is warm and dry.  Psychiatric: His affect is blunt. He is noncommunicative.    Review of Systems  Unable to perform ROS: Psychiatric disorder    Blood pressure 119/76, pulse 95, temperature 98.3 F (36.8 C), temperature source Oral, resp. rate 18, height 5\' 5"  (1.651 m), weight 63.5 kg, SpO2 96 %.Body mass index is 23.3 kg/m.  General Appearance: Casual  Eye Contact:  Minimal  Speech:  Negative  Volume:  Decreased  Mood:  Negative  Affect:  Negative  Thought Process:  NA  Orientation:  Negative  Thought Content:  Negative  Suicidal Thoughts:  No  Homicidal Thoughts:  No  Memory:  Negative  Judgement:  Negative  Insight:  Negative  Psychomotor Activity:  Negative  Concentration:  Concentration: Negative  Recall:  Negative  Fund of Knowledge:  Negative  Language:  Negative  Akathisia:  Negative  Handed:  Right  AIMS (if indicated):     Assets:  Health and safety inspector Housing Physical Health Resilience Social Support  ADL's:  Impaired  Cognition:  Impaired,  Moderate and Severe  Sleep:  Number of Hours: 5.45     Treatment Plan Summary: Patient is a 48 year old male with a long history of refractory depression.  He has been started on ECT treatment with his primary psychiatrist Dr. Toni Amend.    Patient is responding slowly.  We will continue his current medications. Continue the one-to-one for fall risk. Continue to monitor.  Patrick North, MD 02/04/2019, 11:08 AM

## 2019-02-04 NOTE — Progress Notes (Signed)
Patient is refusing to talk to this writer as well as eat and take his morning medication. This Clinical research associate will notify MD.

## 2019-02-04 NOTE — Progress Notes (Signed)
Patient has been sleeping. No sign of distress. Safety monitored on 1:1.

## 2019-02-04 NOTE — Progress Notes (Signed)
Remains asleep. Safety maintained on 1:1.

## 2019-02-04 NOTE — Progress Notes (Signed)
Patient continues to be mute when this Clinical research associate has tried to talk with him as well as encourage him to take his medication. All patient does is blink his eyes and close them when this writer mentions medication, eating, and getting up to ambulate. MD has been made aware of patient's actions and lack thereof.

## 2019-02-05 LAB — GLUCOSE, CAPILLARY: Glucose-Capillary: 97 mg/dL (ref 70–99)

## 2019-02-05 MED ORDER — LORAZEPAM 2 MG/ML IJ SOLN
2.0000 mg | Freq: Three times a day (TID) | INTRAMUSCULAR | Status: AC
  Administered 2019-02-05 – 2019-02-06 (×3): 2 mg via INTRAVENOUS
  Filled 2019-02-05 (×3): qty 1

## 2019-02-05 MED ORDER — ASENAPINE MALEATE 5 MG SL SUBL
10.0000 mg | SUBLINGUAL_TABLET | Freq: Two times a day (BID) | SUBLINGUAL | Status: DC
  Administered 2019-02-05 – 2019-02-18 (×24): 10 mg via SUBLINGUAL
  Filled 2019-02-05 (×29): qty 2

## 2019-02-05 NOTE — Plan of Care (Signed)
Patient is stable and alert responding to base line , there is no noticeable side effects from the ECT treatment v/s are stable, and no falls, bottom is clean and no redness, 1&1 continued in place no distress.   Problem: Education: Goal: Knowledge of [place] [facility] Education information/materials will improve Outcome: Progressing Goal: Emotional status will improve Outcome: Progressing Goal: Mental status will improve Outcome: Progressing Goal: Verbalization of understanding the information provided will improve Outcome: Progressing   Problem: Activity: Goal: Interest or engagement in activities will improve Outcome: Progressing Goal: Sleeping patterns will improve Outcome: Progressing   Problem: Coping: Goal: Ability to verbalize frustrations and anger appropriately will improve Outcome: Progressing Goal: Ability to demonstrate self-control will improve Outcome: Progressing   Problem: Health Behavior/Discharge Planning: Goal: Identification of resources available to assist in meeting health care needs will improve Outcome: Progressing Goal: Compliance with treatment plan for underlying cause of condition will improve Outcome: Progressing   Problem: Physical Regulation: Goal: Ability to maintain clinical measurements within normal limits will improve Outcome: Progressing   Problem: Safety: Goal: Periods of time without injury will increase Outcome: Progressing

## 2019-02-05 NOTE — Progress Notes (Signed)
Patient is stable and resting comfortably, sleep is continuous no distress 1&1 is in place noted.

## 2019-02-05 NOTE — Progress Notes (Signed)
[facility] MD Progress Note  02/05/2019 1:53 PM Troy Fox  MRN:  [id number] Subjective: Patient seen and chart reviewed.  Patient with schizoaffective disorder depressive type with catatonia.  No ECT done today because of the holiday.  Patient has remained mostly catatonic over the weekend.  He is not able to respond verbally to me.  He makes a little bit of eye contact and appears to be awake but does not react to direct commands or requests.  However I am told that he did ambulate to the bathroom on his own today and has eaten a little bit of food although not much.  No evidence of any specific attempts at self-harm or violence. Principal Problem: Schizoaffective disorder, depressive type (HCC) Diagnosis: Principal Problem:   Schizoaffective disorder, depressive type (HCC) Active Problems:   Catatonia   Mild malnutrition (HCC)  Total Time spent with patient: 20 minutes  Past Psychiatric History: Patient has a history of schizoaffective disorder with past positive response to ECT treatment  Past Medical History:  Past Medical History:  Diagnosis Date  . Bipolar 1 disorder (HCC)   . 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 GERD (gastroesophageal reflux disease)   . Nonverbal   . Schizoaffective disorder (HCC)   . Tardive dyskinesia    History reviewed. No pertinent surgical history. Family History:  Family History  Adopted: Yes  Family history unknown: Yes   Family Psychiatric  History: None known Social History:  Social History   Substance and Sexual Activity  Alcohol Use Never  . Frequency: Never     Social History   Substance and Sexual Activity  Drug Use Never    Social History   Socioeconomic History  . Marital status: Single    Spouse name: Not on file  . Number of children: Not on file  . Years of education: Not on file  . Highest education level: Not on file  Occupational History  . Not on file  Social Needs  . Financial resource strain: Not on file  . Food  insecurity:    Worry: Not on file    Inability: Not on file  . Transportation needs:    Medical: Not on file    Non-medical: Not on file  Tobacco Use  . Smoking status: Never Smoker  . Smokeless tobacco: Never Used  Substance and Sexual Activity  . Alcohol use: Never    Frequency: Never  . Drug use: Never  . Sexual activity: Not Currently  Lifestyle  . Physical activity:    Days per week: Not on file    Minutes per session: Not on file  . Stress: Not on file  Relationships  . Social connections:    Talks on phone: Not on file    Gets together: Not on file    Attends religious service: Not on file    Active member of club or organization: Not on file    Attends meetings of clubs or organizations: Not on file    Relationship status: Not on file  Other Topics Concern  . Not on file  Social History Narrative  . Not on file   Additional Social History:    Pain Medications: please see mar Prescriptions: please see mar Over the Counter: please see mar History of alcohol / drug use?: No history of alcohol / drug abuse Longest period of sobriety (when/how long): NA                    Sleep: Fair  Appetite:  Poor  Current Medications: Current Facility-Administered Medications  Medication Dose Route Frequency Provider Last Rate Last Dose  . acetaminophen (TYLENOL) tablet 650 mg  650 mg Oral Q6H PRN Daryus Sowash T, MD      . alum & mag hydroxide-simeth (MAALOX/MYLANTA) 200-200-20 MG/5ML suspension 30 mL  30 mL Oral Q4H PRN Vallerie Hentz T, MD      . asenapine (SAPHRIS) sublingual tablet 10 mg  10 mg Sublingual BID Vern Prestia T, MD      . LORazepam (ATIVAN) injection 2 mg  2 mg Intravenous Q8H Nakaiya Beddow T, MD      . magnesium hydroxide (MILK OF MAGNESIA) suspension 30 mL  30 mL Oral Daily PRN Maryanna Stuber T, MD      . ondansetron (ZOFRAN) injection 4 mg  4 mg Intravenous Once PRN Yves Dillarroll, Paul, MD      . pantoprazole (PROTONIX) EC tablet 40 mg  40 mg Oral  Daily Cherell Colvin, Jackquline DenmarkJohn T, MD   40 mg at 02/03/19 0817  . polyethylene glycol (MIRALAX / GLYCOLAX) packet 17 g  17 g Oral Daily Selam Pietsch, Jackquline DenmarkJohn T, MD   17 g at 02/03/19 0817  . traZODone (DESYREL) tablet 100 mg  100 mg Oral QHS Christinamarie Tall, Jackquline DenmarkJohn T, MD   100 mg at 02/04/19 2123  . vitamin B-12 (CYANOCOBALAMIN) tablet 1,000 mcg  1,000 mcg Oral Daily Blanch Stang, Jackquline DenmarkJohn T, MD   1,000 mcg at 02/03/19 16100817    Lab Results:  Results for orders placed or performed during the hospital encounter of 01/29/19 (from the past 48 hour(s))  Glucose, capillary     Status: None   Collection Time: 02/05/19  6:54 AM  Result Value Ref Range   Glucose-Capillary 97 70 - 99 mg/dL   Comment 1 Notify RN     Blood Alcohol level:  Lab Results  Component Value Date   ETH <10 01/24/2019    Metabolic Disorder Labs: Lab Results  Component Value Date   HGBA1C 4.9 01/30/2019   MPG 93.93 01/30/2019   No results found for: PROLACTIN No results found for: CHOL, TRIG, HDL, CHOLHDL, VLDL, LDLCALC  Physical Findings: AIMS: Facial and Oral Movements Muscles of Facial Expression: None, normal Lips and Perioral Area: None, normal Jaw: None, normal Tongue: None, normal,Extremity Movements Upper (arms, wrists, hands, fingers): None, normal Lower (legs, knees, ankles, toes): None, normal, Trunk Movements Neck, shoulders, hips: None, normal, Overall Severity Severity of abnormal movements (highest score from questions above): None, normal Incapacitation due to abnormal movements: None, normal Patient's awareness of abnormal movements (rate only patient's report): No Awareness, Dental Status Current problems with teeth and/or dentures?: No Does patient usually wear dentures?: No  CIWA:    COWS:     Musculoskeletal: Strength & Muscle Tone: cogwheel Gait & Station: unsteady Patient leans: N/A  Psychiatric Specialty Exam: Physical Exam  Nursing note and vitals reviewed. Constitutional: He appears well-developed.  HENT:  Head:  Normocephalic and atraumatic.  Eyes: Pupils are equal, round, and reactive to light. Conjunctivae are normal.  Neck: Normal range of motion.  Cardiovascular: Regular rhythm and normal heart sounds.  Respiratory: Effort normal. No respiratory distress.  GI: Soft.  Musculoskeletal: Normal range of motion.  Neurological: He is alert.  Intermittently makes eye contact.  Did not produce any comprehensible speech.  Does not move reliably to direct commands but reportedly was able to ambulate to the bathroom on his own.  Skin: Skin is warm and dry.  Psychiatric: His affect is blunt.  He is noncommunicative.    Review of Systems  Unable to perform ROS: Psychiatric disorder    Blood pressure 116/76, pulse (!) 102, temperature 98.6 F (37 C), temperature source Oral, resp. rate 17, height 5\' 5"  (1.651 m), weight 63.5 kg, SpO2 97 %.Body mass index is 23.3 kg/m.  General Appearance: Casual  Eye Contact:  Minimal  Speech:  Negative  Volume:  Decreased  Mood:  Negative  Affect:  Flat  Thought Process:  NA  Orientation:  Negative  Thought Content:  Negative  Suicidal Thoughts:  No  Homicidal Thoughts:  No  Memory:  Immediate;   Poor Recent;   Poor Remote;   Poor  Judgement:  Negative  Insight:  Negative  Psychomotor Activity:  Decreased and TD  Concentration:  Concentration: Negative  Recall:  Negative  Fund of Knowledge:  Negative  Language:  Negative  Akathisia:  Negative  Handed:  Right  AIMS (if indicated):     Assets:  Physical Health Social Support  ADL's:  Impaired  Cognition:  Impaired,  Severe  Sleep:  Number of Hours: 3     Treatment Plan Summary: Daily contact with patient to assess and evaluate symptoms and progress in treatment, Medication management and Plan Patient remains mostly catatonic.  Since we will not have ECT again until Wednesday we will try 24 hours of Ativan to see if it breaks through the catatonia.  Explained all of this to the patient.  Also increase  Saphris to 10 mg twice a day which was explained.  2  Mordecai Rasmussen, MD 02/05/2019, 1:53 PM

## 2019-02-05 NOTE — Progress Notes (Signed)
Patient is up to use the bathroom, skin care is provided , barrier cream is  applied to the bottom ' bottom is clean no redness and no open area, ADL is provided and 1&1 continues for safety noted.

## 2019-02-05 NOTE — Progress Notes (Signed)
Patient continues to be incontinent of urine at times, however, he is using briefs with the mesh underwear provided on the unit. It has been reported to this Clinical research associate that patient has been doing well with getting up and walking to the bathroom whenever he needs to go without any issues.

## 2019-02-05 NOTE — Progress Notes (Signed)
Recreation Therapy Notes   Date: 02/05/2019  Time: 9:30 am   Location: Craft room   Behavioral response: N/A   Intervention Topic: Coping skills  Discussion/Intervention: Patient did not attend group.   Clinical Observations/Feedback:  Patient did not attend group.   Anhar Mcdermott LRT/CTRS        Troy Fox 02/05/2019 10:58 AM 

## 2019-02-05 NOTE — Plan of Care (Signed)
Patient continues to be electively mute for this writer and refused to take his morning medication. Patient continues to lay in bed, only getting up when needing to use the bathroom. Patient allowed this writer to administer his scheduled afternoon injection and he also took his evening medication. Patient consumed about fifty percent of his dinner and stated to his safety sitter that he wanted to finish his meal. This has been a huge improvement in patient's behavior from this morning.  Problem: Education: Goal: Knowledge of [place] [facility] Education information/materials will improve Outcome: Not Progressing Goal: Emotional status will improve Outcome: Not Progressing Goal: Mental status will improve Outcome: Not Progressing Goal: Verbalization of understanding the information provided will improve Outcome: Not Progressing   Problem: Activity: Goal: Interest or engagement in activities will improve Outcome: Not Progressing Goal: Sleeping patterns will improve Outcome: Not Progressing   Problem: Coping: Goal: Ability to verbalize frustrations and anger appropriately will improve Outcome: Not Progressing Goal: Ability to demonstrate self-control will improve Outcome: Not Progressing   Problem: Health Behavior/Discharge Planning: Goal: Identification of resources available to assist in meeting health care needs will improve Outcome: Not Progressing Goal: Compliance with treatment plan for underlying cause of condition will improve Outcome: Not Progressing   Problem: Physical Regulation: Goal: Ability to maintain clinical measurements within normal limits will improve Outcome: Not Progressing   Problem: Safety: Goal: Periods of time without injury will increase Outcome: Not Progressing

## 2019-02-05 NOTE — Progress Notes (Signed)
Patient is in  bed upon approach and informed patient  about getting up and out of bed for a walk around the unit, patient appear reluctant at first but able to comply with a little encouragement,  Patient did walk around  the unit more than 3 times and felt good patient went back to his room with out any distress, sat on the chair before going back to bed, patient remain on 1&1 for fall prevention protocol , denies any SI,HI, ADL is provided.

## 2019-02-05 NOTE — [facility] Counselor (Signed)
PSA not completed at pt is still catatonic at this time.  Iris Pert, MSW, LCSW Clinical Social Work 02/05/2019 11:03 AM

## 2019-02-05 NOTE — Progress Notes (Signed)
Patient is sleeping in bed comfortably with out any disturbances 1&1 is maintained no distress.

## 2019-02-05 NOTE — Progress Notes (Signed)
1:1 Patient Hourly Rounding:  1000: Patient is awake, in bed, with his assigned safety sitter present at bedside.  1400: Patient is laying in bed, awake, with his assigned safety sitter present at bedside.  1800: Patient is in bed, awake, with his assigned safety sitter present at bedside.

## 2019-02-05 NOTE — Progress Notes (Signed)
Patient refuses to talk to this writer as well as take his morning medication. This Clinical research associate will notify MD.

## 2019-02-06 ENCOUNTER — Other Ambulatory Visit: Payer: Self-pay | Admitting: Psychiatry

## 2019-02-06 NOTE — Progress Notes (Signed)
Recreation Therapy Notes   Date: 02/06/2019  Time: 9:30 am   Location: Craft room   Behavioral response: N/A   Intervention Topic: Goals  Discussion/Intervention: Patient did not attend group.   Clinical Observations/Feedback:  Patient did not attend group.   [name] LRT/CTRS        Troy Fox 02/06/2019 11:31 AM 

## 2019-02-06 NOTE — Progress Notes (Signed)
7:40 Patient out of bed  Walking around on unit with staff . Voice no other concerns 1:1 present  0800 Resting in bed HOB elevated for comfort . Patient  Stated he is dry at present time  0900 remained in bed  Voice no other concerns  Awake , 1:1 at  Bedside . Patient remains dry

## 2019-02-06 NOTE — Progress Notes (Signed)
3-7 1:1Continuous   1600 Patient incontient of urine in bed  Large amount . Patient stated why is he doing this.  Patient cleaned  with barrier cream applied  To lower trunk Appetite at dinner poor  Limited conversation  With staff . 1700 Patient returned to bed for comfort.  turned onto right side   1800 Patient checked  For dryness  And at this time patient remain dry  Turned onto left side . No breaks in skin

## 2019-02-06 NOTE — Progress Notes (Signed)
Patient slept through the night without  falls 1&1 is still in place, turned repositioned , bottom is cleaned with water and soap and padded dry and skin barrier applied , no redness noted.

## 2019-02-06 NOTE — Plan of Care (Signed)
Patient remains  stiff and in a catatonic like stupor. Emotional and mental status  show slight improvement . Staff continue to redirect information  and given in concrete formate for better understanding . Unable to attend activities  on the unit . No anger or frustration noted by patient .  Voice no concerns around sleep. Problem: Education: Goal: Knowledge of [facility] Education information/materials will improve Outcome: Progressing Goal: Emotional status will improve Outcome: Progressing Goal: Mental status will improve Outcome: Progressing Goal: Verbalization of understanding the information provided will improve Outcome: Progressing   Problem: Activity: Goal: Interest or engagement in activities will improve Outcome: Progressing Goal: Sleeping patterns will improve Outcome: Progressing   Problem: Coping: Goal: Ability to verbalize frustrations and anger appropriately will improve Outcome: Progressing Goal: Ability to demonstrate self-control will improve Outcome: Progressing   Problem: Health Behavior/Discharge Planning: Goal: Identification of resources available to assist in meeting health care needs will improve Outcome: Progressing Goal: Compliance with treatment plan for underlying cause of condition will improve Outcome: Progressing   Problem: Physical Regulation: Goal: Ability to maintain clinical measurements within normal limits will improve Outcome: Progressing   Problem: Safety: Goal: Periods of time without injury will increase Outcome: Progressing

## 2019-02-06 NOTE — Progress Notes (Signed)
11-3 pm 1:1 continuous Appetite poor at lunch . Patient remained in chair . Patient  Check for incontinence , remained dry.  Continue to say very little  Verbally  Patient remains  stiff and in a catatonic like stupor. Emotional and mental status  show slight improvement . Staff continue to redirect information  and given in concrete formate for better understanding . Unable to attend activities  on the unit . No anger or frustration noted by patient .  Voice no concerns around sleep 12 noon  Patient remained in chair. Appetite at lunch poor . 1300 Patient sleeping in chair  Legs elevated. 1400   Patient skin checked  For incontinence  Remained dry  Remained in chair  With legs elevated for comfort . 1500 Patient walked to bathroom . Patient  Unsteady gait  Unable to void.

## 2019-02-06 NOTE — Progress Notes (Signed)
[facility] MD Progress Note  02/06/2019 2:52 PM Troy Fox  MRN:  [id number] Subjective: Patient seen and chart reviewed.  Patient not able to give any information today.  Not talkative.  Awake and made very brief eye contact but did not maintain eye contact and could not follow any directions.  Eating extremely little.  He is able with a lot of prodding to go to the bathroom.  Affect still flat and depressed.  Addition of Ativan over the last day does not necessarily seem to have made a clear difference Principal Problem: Schizoaffective disorder, depressive type (HCC) Diagnosis: Principal Problem:   Schizoaffective disorder, depressive type (HCC) Active Problems:   Catatonia   Mild malnutrition (HCC)  Total Time spent with patient: 30 minutes  Past Psychiatric History: Patient has a history of schizoaffective disorder with past positive response to ECT.  Past Medical History:  Past Medical History:  Diagnosis Date  . Bipolar 1 disorder (HCC)   . 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 GERD (gastroesophageal reflux disease)   . Nonverbal   . Schizoaffective disorder (HCC)   . Tardive dyskinesia    History reviewed. No pertinent surgical history. Family History:  Family History  Adopted: Yes  Family history unknown: Yes   Family Psychiatric  History: See previous Social History:  Social History   Substance and Sexual Activity  Alcohol Use Never  . Frequency: Never     Social History   Substance and Sexual Activity  Drug Use Never    Social History   Socioeconomic History  . Marital status: Single    Spouse name: Not on file  . Number of children: Not on file  . Years of education: Not on file  . Highest education level: Not on file  Occupational History  . Not on file  Social Needs  . Financial resource strain: Not on file  . Food insecurity:    Worry: Not on file    Inability: Not on file  . Transportation needs:    Medical: Not on file    Non-medical: Not on file   Tobacco Use  . Smoking status: Never Smoker  . Smokeless tobacco: Never Used  Substance and Sexual Activity  . Alcohol use: Never    Frequency: Never  . Drug use: Never  . Sexual activity: Not Currently  Lifestyle  . Physical activity:    Days per week: Not on file    Minutes per session: Not on file  . Stress: Not on file  Relationships  . Social connections:    Talks on phone: Not on file    Gets together: Not on file    Attends religious service: Not on file    Active member of club or organization: Not on file    Attends meetings of clubs or organizations: Not on file    Relationship status: Not on file  Other Topics Concern  . Not on file  Social History Narrative  . Not on file   Additional Social History:    Pain Medications: please see mar Prescriptions: please see mar Over the Counter: please see mar History of alcohol / drug use?: No history of alcohol / drug abuse Longest period of sobriety (when/how long): NA                    Sleep: Fair  Appetite:  Fair  Current Medications: Current Facility-Administered Medications  Medication Dose Route Frequency Provider Last Rate Last Dose  . acetaminophen (TYLENOL) tablet  650 mg  650 mg Oral Q6H PRN Clapacs, John T, MD      . alum & mag hydroxide-simeth (MAALOX/MYLANTA) 200-200-20 MG/5ML suspension 30 mL  30 mL Oral Q4H PRN Clapacs, John T, MD      . asenapine (SAPHRIS) sublingual tablet 10 mg  10 mg Sublingual BID Clapacs, Jackquline DenmarkJohn T, MD   10 mg at 02/06/19 0849  . magnesium hydroxide (MILK OF MAGNESIA) suspension 30 mL  30 mL Oral Daily PRN Clapacs, John T, MD      . ondansetron (ZOFRAN) injection 4 mg  4 mg Intravenous Once PRN Yves Dillarroll, Paul, MD      . pantoprazole (PROTONIX) EC tablet 40 mg  40 mg Oral Daily Clapacs, Jackquline DenmarkJohn T, MD   40 mg at 02/06/19 0825  . polyethylene glycol (MIRALAX / GLYCOLAX) packet 17 g  17 g Oral Daily Clapacs, Jackquline DenmarkJohn T, MD   17 g at 02/06/19 0825  . traZODone (DESYREL) tablet 100 mg   100 mg Oral QHS Clapacs, Jackquline DenmarkJohn T, MD   100 mg at 02/05/19 2131  . vitamin B-12 (CYANOCOBALAMIN) tablet 1,000 mcg  1,000 mcg Oral Daily Clapacs, Jackquline DenmarkJohn T, MD   1,000 mcg at 02/06/19 0848    Lab Results:  Results for orders placed or performed during the hospital encounter of 01/29/19 (from the past 48 hour(s))  Glucose, capillary     Status: None   Collection Time: 02/05/19  6:54 AM  Result Value Ref Range   Glucose-Capillary 97 70 - 99 mg/dL   Comment 1 Notify RN     Blood Alcohol level:  Lab Results  Component Value Date   ETH <10 01/24/2019    Metabolic Disorder Labs: Lab Results  Component Value Date   HGBA1C 4.9 01/30/2019   MPG 93.93 01/30/2019   No results found for: PROLACTIN No results found for: CHOL, TRIG, HDL, CHOLHDL, VLDL, LDLCALC  Physical Findings: AIMS: Facial and Oral Movements Muscles of Facial Expression: None, normal Lips and Perioral Area: None, normal Jaw: None, normal Tongue: None, normal,Extremity Movements Upper (arms, wrists, hands, fingers): None, normal Lower (legs, knees, ankles, toes): None, normal, Trunk Movements Neck, shoulders, hips: None, normal, Overall Severity Severity of abnormal movements (highest score from questions above): None, normal Incapacitation due to abnormal movements: None, normal Patient's awareness of abnormal movements (rate only patient's report): No Awareness, Dental Status Current problems with teeth and/or dentures?: No Does patient usually wear dentures?: No  CIWA:    COWS:     Musculoskeletal: Strength & Muscle Tone: atrophy Gait & Station: unsteady Patient leans: N/A  Psychiatric Specialty Exam: Physical Exam  Nursing note and vitals reviewed. Constitutional: He appears well-developed and well-nourished.  HENT:  Head: Normocephalic and atraumatic.  Eyes: Pupils are equal, round, and reactive to light. Conjunctivae are normal.  Neck: Normal range of motion.  Cardiovascular: Regular rhythm and normal  heart sounds.  Respiratory: Effort normal. No respiratory distress.  GI: Soft.  Musculoskeletal: Normal range of motion.       Arms:  Neurological: He is alert.  Skin: Skin is warm and dry.  Psychiatric: His speech is delayed. He is slowed. He is noncommunicative.    Review of Systems  Unable to perform ROS: Psychiatric disorder    Blood pressure 111/84, pulse (!) 109, temperature 97.6 F (36.4 C), temperature source Oral, resp. rate 18, height 5\' 5"  (1.651 m), weight 63.5 kg, SpO2 97 %.Body mass index is 23.3 kg/m.  General Appearance: Casual  Eye Contact:  Minimal  Speech:  Negative  Volume:  Decreased  Mood:  Negative  Affect:  Flat  Thought Process:  NA  Orientation:  Negative  Thought Content:  Negative  Suicidal Thoughts:  No  Homicidal Thoughts:  No  Memory:  Negative  Judgement:  Negative  Insight:  Negative  Psychomotor Activity:  Decreased  Concentration:  Concentration: Negative  Recall:  Negative  Fund of Knowledge:  Negative  Language:  Negative  Akathisia:  No  Handed:  Right  AIMS (if indicated):     Assets:  Desire for Improvement Housing Physical Health Resilience Social Support  ADL's:  Impaired  Cognition:  Impaired,  Severe  Sleep:  Number of Hours: 3     Treatment Plan Summary: Daily contact with patient to assess and evaluate symptoms and progress in treatment, Medication management and Plan Next ECT treatment tomorrow.  Spoke with the patient and informed him of this.  Tried to encourage him to eat tend to make some verbal contact.  I will cut off the Ativan at this point if it has not been done so already as it seems to be adding nothing and I do not want him to get in the way of ECT.  Mordecai Rasmussen, MD 02/06/2019, 2:52 PM

## 2019-02-06 NOTE — Progress Notes (Signed)
Patient is up and out of bed for a walk around the unit did that twice and with good tolerance , patient returned to his room and sat on the recliner for 15 minutes before going back to bed , bottom is dry with barrier cream applied no redness and no open area , 1&1 continued for fall precautions noted. Marland Kitchen

## 2019-02-06 NOTE — Progress Notes (Signed)
Patient is sleeping after night meds , routine 1&1 is continued for fall precautions noted.

## 2019-02-06 NOTE — Progress Notes (Signed)
7-11 Continuous  1:1  D: Patient  incontinent  Of urine this am shirt .  Bed bath given . Patient not responding  With verbal  Conversation. Noted to just stare  At staff.  9450 Patient received Ativan this am .   Patient  Got out of bed stood  And placed in chair . Patient attempted to feed self. Appetite poor at breakfast .  Patient remained in chair until lunch.

## 2019-02-06 NOTE — [facility] Group Notes (Signed)
  LCSW Group Therapy Note  02/06/2019 2:23 PM   Type of Therapy/Topic:  Group Therapy:  Feelings about Diagnosis  Participation Level:  Did Not Attend   Description of Group:   This group will allow patients to explore their thoughts and feelings about diagnoses they have received. Patients will be guided to explore their level of understanding and acceptance of these diagnoses. Facilitator will encourage patients to process their thoughts and feelings about the reactions of others to their diagnosis and will guide patients in identifying ways to discuss their diagnosis with significant others in their lives. This group will be process-oriented, with patients participating in exploration of their own experiences, giving and receiving support, and processing challenge from other group members.   Therapeutic Goals: 1. Patient will demonstrate understanding of diagnosis as evidenced by identifying two or more symptoms of the disorder 2. Patient will be able to express two feelings regarding the diagnosis 3. Patient will demonstrate their ability to communicate their needs through discussion and/or role play  Summary of Patient Progress: x   Therapeutic Modalities:   Cognitive Behavioral Therapy Brief Therapy Feelings Identification    Iris Pert, MSW, LCSW Clinical Social Work 02/06/2019 2:23 PM

## 2019-02-06 NOTE — Anesthesia Postprocedure Evaluation (Signed)
Anesthesia Post Note  Patient: Page Ramaglia  Procedure(s) Performed: ECT TX  Patient location during evaluation: PACU Anesthesia Type: General Level of consciousness: awake and alert Pain management: pain level controlled Vital Signs Assessment: post-procedure vital signs reviewed and stable Respiratory status: spontaneous breathing, nonlabored ventilation, respiratory function stable and patient connected to nasal cannula oxygen Cardiovascular status: blood pressure returned to baseline and stable Postop Assessment: no apparent nausea or vomiting Anesthetic complications: no     Last Vitals:  Vitals:   02/05/19 2231 02/06/19 0643  BP: (!) 115/91 111/84  Pulse: (!) 122 (!) 109  Resp: 18 18  Temp: 36.4 C   SpO2: 96% 97%    Last Pain:  Vitals:   02/06/19 0900  TempSrc:   PainSc: 0-No pain                 Cleda Mccreedy Piscitello

## 2019-02-07 ENCOUNTER — Inpatient Hospital Stay: Payer: Medicare Other | Admitting: Registered Nurse

## 2019-02-07 LAB — GLUCOSE, CAPILLARY: Glucose-Capillary: 101 mg/dL — ABNORMAL HIGH (ref 70–99)

## 2019-02-07 MED ORDER — SUCCINYLCHOLINE CHLORIDE 20 MG/ML IJ SOLN
INTRAMUSCULAR | Status: AC
  Filled 2019-02-07: qty 3

## 2019-02-07 MED ORDER — METHOHEXITAL SODIUM 100 MG/10ML IV SOSY
PREFILLED_SYRINGE | INTRAVENOUS | Status: DC | PRN
  Administered 2019-02-07: 70 mg via INTRAVENOUS

## 2019-02-07 MED ORDER — SUCCINYLCHOLINE CHLORIDE 20 MG/ML IJ SOLN
INTRAMUSCULAR | Status: DC | PRN
  Administered 2019-02-07: 90 mg via INTRAVENOUS

## 2019-02-07 MED ORDER — LABETALOL HCL 5 MG/ML IV SOLN
INTRAVENOUS | Status: DC | PRN
  Administered 2019-02-07: 10 mg via INTRAVENOUS

## 2019-02-07 MED ORDER — METHOHEXITAL SODIUM 0.5 G IJ SOLR
INTRAMUSCULAR | Status: AC
  Filled 2019-02-07: qty 500

## 2019-02-07 MED ORDER — SODIUM CHLORIDE 0.9 % IV SOLN
INTRAVENOUS | Status: DC | PRN
  Administered 2019-02-07: 10:00:00 via INTRAVENOUS

## 2019-02-07 MED ORDER — SODIUM CHLORIDE 0.9 % IV SOLN
500.0000 mL | Freq: Once | INTRAVENOUS | Status: AC
  Administered 2019-02-09: 10:00:00 via INTRAVENOUS

## 2019-02-07 NOTE — Procedures (Signed)
ECT SERVICES Physician's Interval Evaluation & Treatment Note  Patient Identification: Troy Fox MRN:  [id number] Date of Evaluation:  02/07/2019 TX #: 3  MADRS:   MMSE:   P.E. Findings:  Patient remains very withdrawn and flat.  Muscle atrophy notable.  No sign yet of skin breakdown.  Heart and lungs unremarkable.  Psychiatric Interval Note:  Remains mostly catatonic flat and withdrawn  Subjective:  Patient is a 48 y.o. male seen for evaluation for Electroconvulsive Therapy. Not able to offer any information  Treatment Summary:   []   Right Unilateral             [x]  Bilateral   % Energy : 1.0 ms 100%   Impedance: 1990 ohms  Seizure Energy Index: 13,707 V squared  Postictal Suppression Index: 23%  Seizure Concordance Index: 82%  Medications  Pre Shock: Brevital 70 mg succinylcholine 90 mg  Post Shock: Difficult to read on the output from the computer.  EMG only about 10 seconds and the EEG seizure only about 13 to 15 seconds.  This is rather remarkable given that we have taken him off of benzodiazepines and increase the energy to the maximum.  We will be switching anesthetic to ketamine for next treatment.  Seizure Duration: See note above.   Comments: Inadequate seizure requiring [place] to switch to ketamine.  Lungs:  [x]   Clear to auscultation               []  Other:   Heart:    [x]   Regular rhythm             []  irregular rhythm    [x]   Previous H&P reviewed, patient examined and there are NO CHANGES                 []   Previous H&P reviewed, patient examined and there are changes noted.   Mordecai Rasmussen, MD 5/27/20203:55 PM

## 2019-02-07 NOTE — Anesthesia Postprocedure Evaluation (Signed)
Anesthesia Post Note  Patient: Troy Fox  Procedure(s) Performed: ECT TX  Patient location during evaluation: PACU Anesthesia Type: General Level of consciousness: awake and alert Pain management: pain level controlled Vital Signs Assessment: post-procedure vital signs reviewed and stable Respiratory status: spontaneous breathing, nonlabored ventilation and respiratory function stable Cardiovascular status: blood pressure returned to baseline and stable Postop Assessment: no signs of nausea or vomiting Anesthetic complications: no     Last Vitals:  Vitals:   02/07/19 1056 02/07/19 1105  BP: 119/80 114/79  Pulse: 93   Resp: 16 16  Temp:  36.8 C  SpO2: 94%     Last Pain:  Vitals:   02/07/19 1105  TempSrc:   PainSc: 0-No pain                 Kimila Papaleo

## 2019-02-07 NOTE — Plan of Care (Signed)
  Problem: Education: Goal: Knowledge of [facility] Education information/materials will improve Outcome: Progressing Goal: Emotional status will improve Outcome: Progressing Goal: Mental status will improve Outcome: Progressing Goal: Verbalization of understanding the information provided will improve Outcome: Progressing   Problem: Activity: Goal: Interest or engagement in activities will improve Outcome: Progressing Goal: Sleeping patterns will improve Outcome: Progressing   Problem: Coping: Goal: Ability to verbalize frustrations and anger appropriately will improve Outcome: Progressing Goal: Ability to demonstrate self-control will improve Outcome: Progressing   Problem: Health Behavior/Discharge Planning: Goal: Identification of resources available to assist in meeting health care needs will improve Outcome: Progressing Goal: Compliance with treatment plan for underlying cause of condition will improve Outcome: Progressing   Problem: Physical Regulation: Goal: Ability to maintain clinical measurements within normal limits will improve Outcome: Progressing   Problem: Safety: Goal: Periods of time without injury will increase Outcome: Progressing   

## 2019-02-07 NOTE — Progress Notes (Signed)
1:1 Patient Hourly Rounding  1000: Patient is off the unit for ECT.  1400: Patient is sitting up in the chair in his room with his assigned safety sitter present at bedside.  1800: Patient is laying in bed with his assigned safety sitter present at bedside.

## 2019-02-07 NOTE — Progress Notes (Signed)
[facility] MD Progress Note  02/07/2019 3:58 PM Troy Fox  MRN:  [id number] Subjective: Patient seen and chart reviewed.  Patient also had ECT today.  This was his third ECT treatment although we missed treatment on Monday due to the holiday.  The patient himself is offering no new complaint as he is still catatonic not communicating speaks and really essentially none at all especially to me.  Eating very little.  After treatment he had loosened up a bit.  Ate some lunch and was able to ambulate to the commode with more energy and spoke a few words to nursing.  Seizure today was short and inadequate for uncertain reasons. Principal Problem: Schizoaffective disorder, depressive type (HCC) Diagnosis: Principal Problem:   Schizoaffective disorder, depressive type (HCC) Active Problems:   Catatonia   Mild malnutrition (HCC)  Total Time spent with patient: 30 minutes  Past Psychiatric History: Patient has a history of schizoaffective disorder with this recent spell of catatonic depression on and off for months now  Past Medical History:  Past Medical History:  Diagnosis Date  . Bipolar 1 disorder (HCC)   . 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 GERD (gastroesophageal reflux disease)   . Nonverbal   . Schizoaffective disorder (HCC)   . Tardive dyskinesia    History reviewed. No pertinent surgical history. Family History:  Family History  Adopted: Yes  Family history unknown: Yes   Family Psychiatric  History: See previous Social History:  Social History   Substance and Sexual Activity  Alcohol Use Never  . Frequency: Never     Social History   Substance and Sexual Activity  Drug Use Never    Social History   Socioeconomic History  . Marital status: Single    Spouse name: Not on file  . Number of children: Not on file  . Years of education: Not on file  . Highest education level: Not on file  Occupational History  . Not on file  Social Needs  . Financial resource strain: Not on  file  . Food insecurity:    Worry: Not on file    Inability: Not on file  . Transportation needs:    Medical: Not on file    Non-medical: Not on file  Tobacco Use  . Smoking status: Never Smoker  . Smokeless tobacco: Never Used  Substance and Sexual Activity  . Alcohol use: Never    Frequency: Never  . Drug use: Never  . Sexual activity: Not Currently  Lifestyle  . Physical activity:    Days per week: Not on file    Minutes per session: Not on file  . Stress: Not on file  Relationships  . Social connections:    Talks on phone: Not on file    Gets together: Not on file    Attends religious service: Not on file    Active member of club or organization: Not on file    Attends meetings of clubs or organizations: Not on file    Relationship status: Not on file  Other Topics Concern  . Not on file  Social History Narrative  . Not on file   Additional Social History:    Pain Medications: please see mar Prescriptions: please see mar Over the Counter: please see mar History of alcohol / drug use?: No history of alcohol / drug abuse Longest period of sobriety (when/how long): NA  Sleep: Fair  Appetite:  Poor  Current Medications: Current Facility-Administered Medications  Medication Dose Route Frequency Provider Last Rate Last Dose  . 0.9 %  sodium chloride infusion  500 mL Intravenous Once Nnaemeka Samson T, MD      . acetaminophen (TYLENOL) tablet 650 mg  650 mg Oral Q6H PRN Cornie Herrington T, MD      . alum & mag hydroxide-simeth (MAALOX/MYLANTA) 200-200-20 MG/5ML suspension 30 mL  30 mL Oral Q4H PRN Ezariah Nace T, MD      . asenapine (SAPHRIS) sublingual tablet 10 mg  10 mg Sublingual BID Maecie Sevcik, Jackquline Denmark, MD   10 mg at 02/07/19 1355  . magnesium hydroxide (MILK OF MAGNESIA) suspension 30 mL  30 mL Oral Daily PRN Jenine Krisher T, MD      . ondansetron (ZOFRAN) injection 4 mg  4 mg Intravenous Once PRN Yves Dill, MD      . pantoprazole  (PROTONIX) EC tablet 40 mg  40 mg Oral Daily Trisha Morandi, Jackquline Denmark, MD   40 mg at 02/07/19 1354  . polyethylene glycol (MIRALAX / GLYCOLAX) packet 17 g  17 g Oral Daily Jarell Mcewen, Jackquline Denmark, MD   17 g at 02/06/19 0825  . traZODone (DESYREL) tablet 100 mg  100 mg Oral QHS Marcela Alatorre T, MD   100 mg at 02/06/19 2130  . vitamin B-12 (CYANOCOBALAMIN) tablet 1,000 mcg  1,000 mcg Oral Daily Tamika Nou, Jackquline Denmark, MD   1,000 mcg at 02/07/19 1354    Lab Results:  Results for orders placed or performed during the hospital encounter of 01/29/19 (from the past 48 hour(s))  Glucose, capillary     Status: Abnormal   Collection Time: 02/07/19  7:21 AM  Result Value Ref Range   Glucose-Capillary 101 (H) 70 - 99 mg/dL   Comment 1 Notify RN     Blood Alcohol level:  Lab Results  Component Value Date   ETH <10 01/24/2019    Metabolic Disorder Labs: Lab Results  Component Value Date   HGBA1C 4.9 01/30/2019   MPG 93.93 01/30/2019   No results found for: PROLACTIN No results found for: CHOL, TRIG, HDL, CHOLHDL, VLDL, LDLCALC  Physical Findings: AIMS: Facial and Oral Movements Muscles of Facial Expression: None, normal Lips and Perioral Area: None, normal Jaw: None, normal Tongue: None, normal,Extremity Movements Upper (arms, wrists, hands, fingers): None, normal Lower (legs, knees, ankles, toes): None, normal, Trunk Movements Neck, shoulders, hips: None, normal, Overall Severity Severity of abnormal movements (highest score from questions above): None, normal Incapacitation due to abnormal movements: None, normal Patient's awareness of abnormal movements (rate only patient's report): No Awareness, Dental Status Current problems with teeth and/or dentures?: No Does patient usually wear dentures?: No  CIWA:    COWS:     Musculoskeletal: Strength & Muscle Tone: decreased Gait & Station: unsteady Patient leans: N/A  Psychiatric Specialty Exam: Physical Exam  Nursing note and vitals reviewed.  Constitutional: He appears well-developed and well-nourished.  HENT:  Head: Normocephalic and atraumatic.  Eyes: Pupils are equal, round, and reactive to light. Conjunctivae are normal.  Neck: Normal range of motion.  Cardiovascular: Regular rhythm and normal heart sounds.  Respiratory: Effort normal.  GI: Soft.  Musculoskeletal: Normal range of motion.       Arms:  Neurological: He is alert.  Skin: Skin is warm and dry.  Psychiatric: His affect is blunt. He is noncommunicative.    Review of Systems  Unable to perform ROS: Psychiatric disorder  Blood pressure 134/88, pulse (!) 118, temperature 98.5 F (36.9 C), temperature source Oral, resp. rate 16, height 5\' 5"  (1.651 m), weight 63.5 kg, SpO2 98 %.Body mass index is 23.3 kg/m.  General Appearance: Disheveled  Eye Contact:  Minimal  Speech:  Garbled and Slow  Volume:  Decreased  Mood:  Negative  Affect:  Flat  Thought Process:  NA  Orientation:  Negative  Thought Content:  Negative  Suicidal Thoughts:  No  Homicidal Thoughts:  No  Memory:  Negative  Judgement:  Negative  Insight:  Negative  Psychomotor Activity:  Decreased  Concentration:  Concentration: Poor  Recall:  Poor  Fund of Knowledge:  Fair  Language:  Fair  Akathisia:  No  Handed:  Right  AIMS (if indicated):     Assets:  Desire for Improvement Housing Social Support  ADL's:  Impaired  Cognition:  Impaired,  Moderate  Sleep:  Number of Hours: 6.75     Treatment Plan Summary: Daily contact with patient to assess and evaluate symptoms and progress in treatment, Medication management and Plan We should be on track for 3 times a week ECT going forward.  Every time we do treatment he shows improvement immediately afterwards which still makes me very optimistic.  We are going to have to make some kind of adjustment to get a more adequate seizure this next time probably by switching anesthetic.  I will contact his father this afternoon to give him an update  as well.  Patient continues to need a one-to-one for safety.  Troy RasmussenJohn Zaiyden Strozier, MD 02/07/2019, 3:58 PM

## 2019-02-07 NOTE — Progress Notes (Signed)
Patient is now sitting up in bed eating his lunch and talking some with this Clinical research associate. Patient states to this writer that "I can't remember anything, what's going on". Patient is showing improvement after ECT treatment.

## 2019-02-07 NOTE — Anesthesia Preprocedure Evaluation (Signed)
Anesthesia Evaluation  Patient identified by MRN, date of birth, ID band Patient awake  General Assessment Comment:Review limited to chart review, limited verbal communication  Reviewed: Allergy & Precautions, NPO status , Patient's Chart, lab work & pertinent test results  History of Anesthesia Complications Negative for: history of anesthetic complications  Airway Mallampati: II  TM Distance: >3 FB Neck ROM: Full    Dental  (+) Poor Dentition   Pulmonary neg pulmonary ROS, neg sleep apnea, neg COPD,    breath sounds clear to auscultation- rhonchi (-) wheezing      Cardiovascular (-) hypertension(-) CAD, (-) Past MI, (-) Cardiac Stents and (-) CABG  Rhythm:Regular Rate:Normal - Systolic murmurs and - Diastolic murmurs    Neuro/Psych neg Seizures PSYCHIATRIC DISORDERS Depression Bipolar Disorder Schizophrenia negative neurological ROS     GI/Hepatic Neg liver ROS, GERD  ,  Endo/Other  negative endocrine ROSneg diabetes  Renal/GU negative Renal ROS     Musculoskeletal negative musculoskeletal ROS (+)   Abdominal (+) - obese,   Peds  Hematology negative hematology ROS (+)   Anesthesia Other Findings Past Medical History: No date: Bipolar 1 disorder (HCC) No date: 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HCC) No date: GERD (gastroesophageal reflux disease) No date: Nonverbal No date: Schizoaffective disorder (HCC) No date: Tardive dyskinesia   Reproductive/Obstetrics                             Anesthesia Physical  Anesthesia Plan  ASA: II  Anesthesia Plan: General   Post-op Pain Management:    Induction: Intravenous  PONV Risk Score and Plan: 1 and Ondansetron  Airway Management Planned: Mask  Additional Equipment:   Intra-op Plan:   Post-operative Plan:   Informed Consent: I have reviewed the patients History and Physical, chart, labs and discussed the procedure including the risks,  benefits and alternatives for the proposed anesthesia with the patient or authorized representative who has indicated his/her understanding and acceptance.     Dental advisory given  Plan Discussed with: CRNA and Anesthesiologist  Anesthesia Plan Comments:         Anesthesia Quick Evaluation  

## 2019-02-07 NOTE — Tx Team (Signed)
Interdisciplinary Treatment and Diagnostic Plan Update  02/07/2019 Time of Session: 830am Troy Fox MRN: [id number]  Principal Diagnosis: Schizoaffective disorder, depressive type [facility])  Secondary Diagnoses: Principal Problem:   Schizoaffective disorder, depressive type ([place]) Active Problems:   Catatonia   Mild malnutrition ([place])   Current Medications:  Current Facility-Administered Medications  Medication Dose Route Frequency Provider Last Rate Last Dose  . acetaminophen (TYLENOL) tablet 650 mg  650 mg Oral Q6H PRN Clapacs, John T, MD      . alum & mag hydroxide-simeth (MAALOX/MYLANTA) 200-200-20 MG/5ML suspension 30 mL  30 mL Oral Q4H PRN Clapacs, John T, MD      . asenapine (SAPHRIS) sublingual tablet 10 mg  10 mg Sublingual BID Clapacs, Madie Reno, MD   10 mg at 02/06/19 1605  . magnesium hydroxide (MILK OF MAGNESIA) suspension 30 mL  30 mL Oral Daily PRN Clapacs, John T, MD      . ondansetron (ZOFRAN) injection 4 mg  4 mg Intravenous Once PRN Alvin Critchley, MD      . pantoprazole (PROTONIX) EC tablet 40 mg  40 mg Oral Daily Clapacs, Madie Reno, MD   40 mg at 02/06/19 0825  . polyethylene glycol (MIRALAX / GLYCOLAX) packet 17 g  17 g Oral Daily Clapacs, Madie Reno, MD   17 g at 02/06/19 0825  . traZODone (DESYREL) tablet 100 mg  100 mg Oral QHS Clapacs, John T, MD   100 mg at 02/06/19 2130  . vitamin B-12 (CYANOCOBALAMIN) tablet 1,000 mcg  1,000 mcg Oral Daily Clapacs, Madie Reno, MD   1,000 mcg at 02/06/19 0848   PTA Medications: Medications Prior to Admission  Medication Sig Dispense Refill Last Dose  . atenolol (TENORMIN) 25 MG tablet Take 1 tablet (25 mg total) by mouth daily. 90 tablet 0 01/30/2019  . busPIRone (BUSPAR) 5 MG tablet Take 1 tablet (5 mg total) by mouth 2 (two) times daily. (Troy Fox taking differently: Take 5 mg by mouth 2 (two) times daily as needed. ) 60 tablet 0 01/30/2019  . Cyanocobalamin (VITAMIN B 12) 500 MCG TABS Take 500 mcg by mouth daily. 90 tablet 0 01/30/2019  .  LORazepam (ATIVAN) 0.5 MG tablet Take 1 tablet (0.5 mg total) by mouth every 8 (eight) hours as needed for anxiety. 90 tablet 2 01/30/2019  . omeprazole (PRILOSEC) 40 MG capsule Take 1 capsule (40 mg total) by mouth daily. 90 capsule 0 01/30/2019  . paliperidone (INVEGA) 6 MG 24 hr tablet Take 1 tablet (6 mg total) by mouth daily. 90 tablet 0 01/30/2019  . polyethylene glycol (MIRALAX) 17 g packet Take 17 g by mouth daily as needed for moderate constipation. 72 packet 0 01/30/2019  . sertraline (ZOLOFT) 50 MG tablet Take 3 tablets (150 mg total) by mouth daily. 270 tablet 0 01/30/2019  . traZODone (DESYREL) 50 MG tablet Take 1 tablet (50 mg total) by mouth at bedtime. 90 tablet 0 01/30/2019  . Valbenazine Tosylate (INGREZZA) 40 & 80 MG CPPK Take 40 mg by mouth daily for 7 days, THEN 80 mg daily for 30 days. 30 each 0 01/30/2019  . benztropine (COGENTIN) 1 MG tablet Take 1 tablet (1 mg total) by mouth 2 (two) times daily for 7 days. 14 tablet 0 01/23/2019 at 1730    Troy Fox Stressors: Financial difficulties Health problems  Troy Fox Strengths: Motivation for treatment/growth Supportive family/friends  Treatment Modalities: Medication Management, Group therapy, Case management,  1 to 1 session with clinician, Psychoeducation, Recreational therapy.   Physician Treatment  Plan for Primary Diagnosis: Schizoaffective disorder, depressive type ([place]) Long Term Goal(s): Improvement in symptoms so as ready for discharge Improvement in symptoms so as ready for discharge   Short Term Goals: Ability to verbalize feelings will improve Compliance with prescribed medications will improve Ability to maintain clinical measurements within normal limits will improve  Medication Management: Evaluate Troy Fox's response, side effects, and tolerance of medication regimen.  Therapeutic Interventions: 1 to 1 sessions, Unit Group sessions and Medication administration.  Evaluation of Outcomes: Not Met  Physician  Treatment Plan for Secondary Diagnosis: Principal Problem:   Schizoaffective disorder, depressive type ([place]) Active Problems:   Catatonia   Mild malnutrition ([address])  Long Term Goal(s): Improvement in symptoms so as ready for discharge Improvement in symptoms so as ready for discharge   Short Term Goals: Ability to verbalize feelings will improve Compliance with prescribed medications will improve Ability to maintain clinical measurements within normal limits will improve     Medication Management: Evaluate Troy Fox's response, side effects, and tolerance of medication regimen.  Therapeutic Interventions: 1 to 1 sessions, Unit Group sessions and Medication administration.  Evaluation of Outcomes: Not Met   RN Treatment Plan for Primary Diagnosis: Schizoaffective disorder, depressive type ([place]) Long Term Goal(s): Knowledge of disease and therapeutic regimen to maintain health will improve  Short Term Goals: Ability to identify and develop effective coping behaviors will improve and Compliance with prescribed medications will improve  Medication Management: RN will administer medications as ordered by provider, will assess and evaluate Troy Fox's response and provide education to Troy Fox for prescribed medication. RN will report any adverse and/or side effects to prescribing provider.  Therapeutic Interventions: 1 on 1 counseling sessions, Psychoeducation, Medication administration, Evaluate responses to treatment, Monitor vital signs and CBGs as ordered, Perform/monitor CIWA, COWS, AIMS and Fall Risk screenings as ordered, Perform wound care treatments as ordered.  Evaluation of Outcomes: Not Met   LCSW Treatment Plan for Primary Diagnosis: Schizoaffective disorder, depressive type ([place]) Long Term Goal(s): Safe transition to appropriate next level of care at discharge, Engage Troy Fox in therapeutic group addressing interpersonal concerns.  Short Term Goals: Engage Troy Fox in aftercare  planning with referrals and resources  Therapeutic Interventions: Assess for all discharge needs, 1 to 1 time with Social worker, Explore available resources and support systems, Assess for adequacy in community support network, Educate family and significant other(s) on suicide prevention, Complete Psychosocial Assessment, Interpersonal group therapy.  Evaluation of Outcomes: Not Met   Progress in Treatment: Attending groups: No. Participating in groups: No. Taking medication as prescribed: Yes. Toleration medication: Yes. Family/Significant other contact made: No, will contact:  when pt gives consent Troy Fox understands diagnosis: No. Discussing Troy Fox identified problems/goals with staff: No. Medical problems stabilized or resolved: No. Denies suicidal/homicidal ideation: Unknown Issues/concerns per Troy Fox self-inventory: No. Other: N/A  New problem(s) identified: No, Describe:  None reported  New Short Term/Long Term Goal(s):Pt is immobile and experiencing minimal verbal communication. Pt did not attend today's meeting.  Troy Fox Goals:  Pt is still catatonic and immobile, Pt unable to provide a goal at this time.  Discharge Plan or Barriers: Pt will resume outpatient ECT. 02/07/19-No change in progress. Will receive ECT today.  Reason for Continuation of Hospitalization: Medication stabilization  Estimated Length of Stay: TBD  Recreational Therapy: Troy Fox Stressors: N/A  Troy Fox Goal: Troy Fox will engage in groups without prompting or encouragement from LRT x3 group sessions within 5 recreation therapy group sessions  Attendees: Troy Fox: 02/07/2019 9:24 AM  Physician: Alethia Berthold 02/07/2019 9:24  AM  Nursing: Jamesetta So 02/07/2019 9:24 AM  RN Care Manager: 02/07/2019 9:24 AM  Social Worker: Sanjuana Kava [facility] 02/07/2019 9:24 AM  Recreational Therapist:  02/07/2019 9:24 AM  Other:  02/07/2019 9:24 AM  Other:  02/07/2019  9:24 AM  Other: 02/07/2019 9:24 AM    Scribe for Treatment Team: Yvette Rack, LCSW 02/07/2019 9:24 AM

## 2019-02-07 NOTE — Anesthesia Post-op Follow-up Note (Signed)
Anesthesia QCDR form completed.        

## 2019-02-07 NOTE — Plan of Care (Signed)
D- Patient alert and oriented. Patient presented in a more pleasant mood after ECT, however, he did have complaints of being confused stating "what, happened? I can't remember anything". Patient denied any signs/symptoms of depression and anxiety by shaking his head no. Patient denies SI, HI, AVH, and pain at this time. Patient had no stated goals for today.  A- Scheduled medications administered to patient, per MD orders. Support and encouragement provided.  Routine safety checks conducted every 15 minutes.  Patient informed to notify staff with problems or concerns.  R- No adverse drug reactions noted. Patient contracts for safety at this time. Patient compliant with medications and treatment plan. Patient receptive, calm, and cooperative. Patient remains safe at this time.  Problem: Education: Goal: Knowledge of [place] [facility] Education information/materials will improve Outcome: Progressing Goal: Emotional status will improve Outcome: Progressing Goal: Mental status will improve Outcome: Progressing Goal: Verbalization of understanding the information provided will improve Outcome: Progressing   Problem: Activity: Goal: Interest or engagement in activities will improve Outcome: Progressing Goal: Sleeping patterns will improve Outcome: Progressing   Problem: Coping: Goal: Ability to verbalize frustrations and anger appropriately will improve Outcome: Progressing Goal: Ability to demonstrate self-control will improve Outcome: Progressing   Problem: Health Behavior/Discharge Planning: Goal: Identification of resources available to assist in meeting health care needs will improve Outcome: Progressing Goal: Compliance with treatment plan for underlying cause of condition will improve Outcome: Progressing   Problem: Physical Regulation: Goal: Ability to maintain clinical measurements within normal limits will improve Outcome: Progressing   Problem: Safety: Goal: Periods of  time without injury will increase Outcome: Progressing

## 2019-02-07 NOTE — Progress Notes (Signed)
Patient is scheduled for ECT this morning, his medications will be administered upon return.

## 2019-02-07 NOTE — Progress Notes (Signed)
Patient received his morning dose of Saphris late because of ECT this morning, so when this writer tried to administer his evening medicine, he stated "I already took that".

## 2019-02-07 NOTE — Progress Notes (Signed)
Patient alert x 2, respiration even non labored, noted in bed asleep with eyes closed sitter at bedside , patient turned and repositioned every 2 hours no distress noted will continue to monitor.

## 2019-02-07 NOTE — Progress Notes (Signed)
1:1 Patient hourly rounding   2000: Pt awake and sitting up in bed. Pt's father called and spoke with him. Pt is minimally verbal. No distress noted or needs expressed  2100: Pt and MHT went around unit. MHT pushed pt in a wheelchair. Pt had no complaints and did not appear to be in distress. Pt spoke with this RN just enough to share he did not want to attend wrap up group.   2200: Pt took evening medications with no issue. Pt stated hew as sleepy. RN helped position pt in bed with pillows supporting bony prominences. No distress noted or needs expressed.   2300: Pt resting in bed with eyes closed. Respirations even and unlabored. No distress noted.

## 2019-02-07 NOTE — H&P (Signed)
Troy Fox is an 48 y.o. male.   Chief Complaint: Patient is nonverbal unable to give any information HPI: History of schizoaffective disorder currently in a catatonic state.  Past good response to ECT  Past Medical History:  Diagnosis Date  . Bipolar 1 disorder (HCC)   . 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 GERD (gastroesophageal reflux disease)   . Nonverbal   . Schizoaffective disorder (HCC)   . Tardive dyskinesia     History reviewed. No pertinent surgical history.  Family History  Adopted: Yes  Family history unknown: Yes   Social History:  reports that he has never smoked. He has never used smokeless tobacco. He reports that he does not drink alcohol or use drugs.  Allergies: No Known Allergies  Medications Prior to Admission  Medication Sig Dispense Refill  . atenolol (TENORMIN) 25 MG tablet Take 1 tablet (25 mg total) by mouth daily. 90 tablet 0  . busPIRone (BUSPAR) 5 MG tablet Take 1 tablet (5 mg total) by mouth 2 (two) times daily. (Patient taking differently: Take 5 mg by mouth 2 (two) times daily as needed. ) 60 tablet 0  . Cyanocobalamin (VITAMIN B 12) 500 MCG TABS Take 500 mcg by mouth daily. 90 tablet 0  . LORazepam (ATIVAN) 0.5 MG tablet Take 1 tablet (0.5 mg total) by mouth every 8 (eight) hours as needed for anxiety. 90 tablet 2  . omeprazole (PRILOSEC) 40 MG capsule Take 1 capsule (40 mg total) by mouth daily. 90 capsule 0  . paliperidone (INVEGA) 6 MG 24 hr tablet Take 1 tablet (6 mg total) by mouth daily. 90 tablet 0  . polyethylene glycol (MIRALAX) 17 g packet Take 17 g by mouth daily as needed for moderate constipation. 72 packet 0  . sertraline (ZOLOFT) 50 MG tablet Take 3 tablets (150 mg total) by mouth daily. 270 tablet 0  . traZODone (DESYREL) 50 MG tablet Take 1 tablet (50 mg total) by mouth at bedtime. 90 tablet 0  . Valbenazine Tosylate (INGREZZA) 40 & 80 MG CPPK Take 40 mg by mouth daily for 7 days, THEN 80 mg daily for 30 days. 30 each 0  .  benztropine (COGENTIN) 1 MG tablet Take 1 tablet (1 mg total) by mouth 2 (two) times daily for 7 days. 14 tablet 0    Results for orders placed or performed during the hospital encounter of 01/29/19 (from the past 48 hour(s))  Glucose, capillary     Status: Abnormal   Collection Time: 02/07/19  7:21 AM  Result Value Ref Range   Glucose-Capillary 101 (H) 70 - 99 mg/dL   Comment 1 Notify RN    No results found.  Review of Systems  Unable to perform ROS: Psychiatric disorder    Blood pressure 127/86, pulse (!) 105, temperature 98.2 F (36.8 C), temperature source Oral, resp. rate 18, height 5\' 5"  (1.651 m), weight 63.5 kg, SpO2 99 %. Physical Exam  Nursing note and vitals reviewed. Constitutional: He appears well-developed and well-nourished.  HENT:  Head: Normocephalic and atraumatic.  Eyes: Pupils are equal, round, and reactive to light. Conjunctivae are normal.  Neck: Normal range of motion.  Cardiovascular: Regular rhythm and normal heart sounds.  Respiratory: Effort normal.  GI: Soft.  Musculoskeletal: Normal range of motion.  Neurological: He is alert.  Skin: Skin is warm and dry.  Psychiatric: He is noncommunicative.     Assessment/Plan We are trying to get ECT done as aggressively as possible to breakthrough this catatonia so  that he is able to function mentally and physically better.  Continue 3 times a week treatment at this point.  Mordecai RasmussenJohn Clapacs, MD 02/07/2019, 9:54 AM

## 2019-02-07 NOTE — Plan of Care (Signed)
  Problem: Safety: Goal: Periods of time without injury will increase Outcome: Progressing  Patient had periods of ambulating on the unit and also rest period afterwards, he follows simple commands.

## 2019-02-07 NOTE — [facility] Group Notes (Signed)
LCSW Group Therapy Note  02/07/2019 1:00 PM  Type of Therapy/Topic:  Group Therapy:  Emotion Regulation  Participation Level:  Did Not Attend   Description of Group:   The purpose of this group is to assist patients in learning to regulate negative emotions and experience positive emotions. Patients will be guided to discuss ways in which they have been vulnerable to their negative emotions. These vulnerabilities will be juxtaposed with experiences of positive emotions or situations, and patients will be challenged to use positive emotions to combat negative ones. Special emphasis will be placed on coping with negative emotions in conflict situations, and patients will process healthy conflict resolution skills.  Therapeutic Goals: 1. Patient will identify two positive emotions or experiences to reflect on in order to balance out negative emotions 2. Patient will label two or more emotions that they find the most difficult to experience 3. Patient will demonstrate positive conflict resolution skills through discussion and/or role plays  Summary of Patient Progress: X  Therapeutic Modalities:   Cognitive Behavioral Therapy Feelings Identification Dialectical Behavioral Therapy  Troy Fox, MSW, LCSW 02/07/2019 11:33 AM  

## 2019-02-07 NOTE — Progress Notes (Signed)
Patient turned and repositioned, brief was changed, no distress noted , sitter at bedside will continue to monitoir.

## 2019-02-07 NOTE — Anesthesia Procedure Notes (Signed)
Procedure Name: General with mask airway Date/Time: 02/07/2019 10:35 AM Performed by: Karoline Caldwell, CRNA Pre-anesthesia Checklist: Patient identified, Emergency Drugs available, Suction available and Patient being monitored Patient Re-evaluated:Patient Re-evaluated prior to induction Oxygen Delivery Method: Circle system utilized Preoxygenation: Pre-oxygenation with 100% oxygen Induction Type: IV induction Ventilation: Mask ventilation without difficulty and Mask ventilation throughout procedure Airway Equipment and Method: Bite block Placement Confirmation: positive ETCO2 Dental Injury: Teeth and Oropharynx as per pre-operative assessment

## 2019-02-07 NOTE — Progress Notes (Signed)
Patient alert and oriented x 2 with periods of confusion to place and situation, affect is flat but he brightens upon approach, thoughts are disorganized , he forwards very little,  In bed with eyes closed, no distress noted, safety sitter at bedside will continue to monitor.

## 2019-02-07 NOTE — Transfer of Care (Signed)
Immediate Anesthesia Transfer of Care Note  Patient: Troy Fox  Procedure(s) Performed: ECT TX  Patient Location: PACU  Anesthesia Type:General  Level of Consciousness: sedated  Airway & Oxygen Therapy: Patient Spontanous Breathing and Patient connected to face mask oxygen  Post-op Assessment: Report given to RN and Post -op Vital signs reviewed and stable  Post vital signs: Reviewed and stable  Last Vitals:  Vitals Value Taken Time  BP 116/84 02/07/2019 10:44 AM  Temp 36.8 C 02/07/2019 10:44 AM  Pulse 97 02/07/2019 10:44 AM  Resp 12 02/07/2019 10:44 AM  SpO2 100 % 02/07/2019 10:44 AM  Vitals shown include unvalidated device data.  Last Pain:  Vitals:   02/07/19 1044  TempSrc:   PainSc: 0-No pain         Complications: No apparent anesthesia complications

## 2019-02-07 NOTE — Progress Notes (Signed)
Recreation Therapy Notes  Date: 02/07/2019  Time: 9:30 am   Location: Craft room   Behavioral response: N/A   Intervention Topic: Stress  Discussion/Intervention: Patient did not attend group.   Clinical Observations/Feedback:  Patient did not attend group.   Arrion Broaddus LRT/CTRS        Dellar Traber 02/07/2019 11:07 AM

## 2019-02-08 ENCOUNTER — Other Ambulatory Visit: Payer: Self-pay | Admitting: Psychiatry

## 2019-02-08 NOTE — Progress Notes (Signed)
[facility] MD Progress Note  02/08/2019 3:15 PM Troy Fox  MRN:  [id number] Subjective: Patient seen chart reviewed.  Patient today once again did not communicate with me.  The 1 exception to that was he shook his head no and answer to one question.  Still back to not eating.  Nurses report he is been a little irritable and has been smacking out at nurses a few times but no one has been injured.  Still able occasionally to go to the bathroom.  Not communicating affect looks flat and withdrawn.  Vital stable. Principal Problem: Schizoaffective disorder, depressive type (HCC) Diagnosis: Principal Problem:   Schizoaffective disorder, depressive type (HCC) Active Problems:   Catatonia   Mild malnutrition (HCC)  Total Time spent with patient: 30 minutes  Past Psychiatric History: Past history of schizoaffective disorder with positive previous response to ECT  Past Medical History:  Past Medical History:  Diagnosis Date  . Bipolar 1 disorder (HCC)   . 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 GERD (gastroesophageal reflux disease)   . Nonverbal   . Schizoaffective disorder (HCC)   . Tardive dyskinesia    History reviewed. No pertinent surgical history. Family History:  Family History  Adopted: Yes  Family history unknown: Yes   Family Psychiatric  History: None known Social History:  Social History   Substance and Sexual Activity  Alcohol Use Never  . Frequency: Never     Social History   Substance and Sexual Activity  Drug Use Never    Social History   Socioeconomic History  . Marital status: Single    Spouse name: Not on file  . Number of children: Not on file  . Years of education: Not on file  . Highest education level: Not on file  Occupational History  . Not on file  Social Needs  . Financial resource strain: Not on file  . Food insecurity:    Worry: Not on file    Inability: Not on file  . Transportation needs:    Medical: Not on file    Non-medical: Not on file   Tobacco Use  . Smoking status: Never Smoker  . Smokeless tobacco: Never Used  Substance and Sexual Activity  . Alcohol use: Never    Frequency: Never  . Drug use: Never  . Sexual activity: Not Currently  Lifestyle  . Physical activity:    Days per week: Not on file    Minutes per session: Not on file  . Stress: Not on file  Relationships  . Social connections:    Talks on phone: Not on file    Gets together: Not on file    Attends religious service: Not on file    Active member of club or organization: Not on file    Attends meetings of clubs or organizations: Not on file    Relationship status: Not on file  Other Topics Concern  . Not on file  Social History Narrative  . Not on file   Additional Social History:    Pain Medications: please see mar Prescriptions: please see mar Over the Counter: please see mar History of alcohol / drug use?: No history of alcohol / drug abuse Longest period of sobriety (when/how long): NA                    Sleep: Fair  Appetite:  Poor  Current Medications: Current Facility-Administered Medications  Medication Dose Route Frequency Provider Last Rate Last Dose  . 0.9 %  sodium chloride infusion  500 mL Intravenous Once Clapacs, John T, MD      . acetaminophen (TYLENOL) tablet 650 mg  650 mg Oral Q6H PRN Clapacs, John T, MD      . alum & mag hydroxide-simeth (MAALOX/MYLANTA) 200-200-20 MG/5ML suspension 30 mL  30 mL Oral Q4H PRN Clapacs, John T, MD      . asenapine (SAPHRIS) sublingual tablet 10 mg  10 mg Sublingual BID Clapacs, Jackquline DenmarkJohn T, MD   10 mg at 02/08/19 0820  . magnesium hydroxide (MILK OF MAGNESIA) suspension 30 mL  30 mL Oral Daily PRN Clapacs, John T, MD      . ondansetron (ZOFRAN) injection 4 mg  4 mg Intravenous Once PRN Yves Dillarroll, Paul, MD      . pantoprazole (PROTONIX) EC tablet 40 mg  40 mg Oral Daily Clapacs, Jackquline DenmarkJohn T, MD   40 mg at 02/08/19 [id number]  . polyethylene glycol (MIRALAX / GLYCOLAX) packet 17 g  17 g Oral  Daily Clapacs, Jackquline DenmarkJohn T, MD   17 g at 02/08/19 [id number]  . traZODone (DESYREL) tablet 100 mg  100 mg Oral QHS Clapacs, Jackquline DenmarkJohn T, MD   100 mg at 02/07/19 2147  . vitamin B-12 (CYANOCOBALAMIN) tablet 1,000 mcg  1,000 mcg Oral Daily Clapacs, Jackquline DenmarkJohn T, MD   1,000 mcg at 02/08/19 [id number]    Lab Results:  Results for orders placed or performed during the hospital encounter of 01/29/19 (from the past 48 hour(s))  Glucose, capillary     Status: Abnormal   Collection Time: 02/07/19  7:21 AM  Result Value Ref Range   Glucose-Capillary 101 (H) 70 - 99 mg/dL   Comment 1 Notify RN     Blood Alcohol level:  Lab Results  Component Value Date   ETH <10 01/24/2019    Metabolic Disorder Labs: Lab Results  Component Value Date   HGBA1C 4.9 01/30/2019   MPG 93.93 01/30/2019   No results found for: PROLACTIN No results found for: CHOL, TRIG, HDL, CHOLHDL, VLDL, LDLCALC  Physical Findings: AIMS: Facial and Oral Movements Muscles of Facial Expression: None, normal Lips and Perioral Area: None, normal Jaw: None, normal Tongue: None, normal,Extremity Movements Upper (arms, wrists, hands, fingers): None, normal Lower (legs, knees, ankles, toes): None, normal, Trunk Movements Neck, shoulders, hips: None, normal, Overall Severity Severity of abnormal movements (highest score from questions above): None, normal Incapacitation due to abnormal movements: None, normal Patient's awareness of abnormal movements (rate only patient's report): No Awareness, Dental Status Current problems with teeth and/or dentures?: No Does patient usually wear dentures?: No  CIWA:    COWS:     Musculoskeletal: Strength & Muscle Tone: decreased Gait & Station: unsteady Patient leans: N/A  Psychiatric Specialty Exam: Physical Exam  Nursing note and vitals reviewed. Constitutional: He appears well-developed.  HENT:  Head: Normocephalic and atraumatic.  Eyes: Pupils are equal, round, and reactive to light. Conjunctivae are  normal.  Neck: Normal range of motion.  Cardiovascular: Normal heart sounds.  Respiratory: Effort normal.  GI: Soft.  Musculoskeletal: Normal range of motion.  Neurological: He is alert. He exhibits abnormal muscle tone. Coordination abnormal.  Skin: Skin is warm and dry.  Psychiatric: His affect is blunt. His speech is delayed. He is noncommunicative.    Review of Systems  Unable to perform ROS: Psychiatric disorder    Blood pressure 112/76, pulse 96, temperature 98.4 F (36.9 C), temperature source Oral, resp. rate 17, height 5\' 5"  (1.651 m), weight 63.5 kg, SpO2 98 %.Body  mass index is 23.3 kg/m.  General Appearance: Disheveled  Eye Contact:  Minimal  Speech:  Negative  Volume:  Decreased  Mood:  Negative  Affect:  Negative  Thought Process:  NA  Orientation:  Negative  Thought Content:  Negative  Suicidal Thoughts:  No  Homicidal Thoughts:  No  Memory:  Negative  Judgement:  Negative  Insight:  Negative  Psychomotor Activity:  Negative  Concentration:  Concentration: Negative  Recall:  Negative  Fund of Knowledge:  Negative  Language:  Negative  Akathisia:  Negative  Handed:  Right  AIMS (if indicated):     Assets:  Financial Resources/Insurance Housing Resilience  ADL's:  Impaired  Cognition:  Impaired,  Severe  Sleep:  Number of Hours: 7.75     Treatment Plan Summary: Daily contact with patient to assess and evaluate symptoms and progress in treatment, Medication management and Plan As usual I spoke to the patient as though I assume that he was listening to me.  Reassured him that we are still working on his getting better and that I am not discouraged.  ECT tomorrow morning.  I spoke with the patient's father yesterday who told me it took about 5 treatments for the patient to show a clear response last time so we will certainly be looking forward to that coming up in the next few days.  No change to medicine for today.  Mordecai Rasmussen, MD 02/08/2019, 3:15 PM

## 2019-02-08 NOTE — Progress Notes (Signed)
Continuous 1:1  Monitoring  7-11 am  7am  Patient resting in bed  Head of bed slightly elevated Moving about freely in bed  Able to turn self   8am Continue to sleep  Moving about freely  In bed able to turn self   9am  Patient ate 25% of breakfast Sitting up on the side of bed  10 am  Patient resting in bed  Head of bed slightly elevated Moving about freely in bed  Able to turn self  11am  Lying in bed awake  Head of bed slightly elevated Moving about freely in bed  Able to turn self

## 2019-02-08 NOTE — Plan of Care (Signed)
Patient remains  catatonic like stupor. Emotional and mental status  improvement  noted . Staff continue to redirect information  and given in concrete formate for better understanding . Refuse to attend activities  on the unit . No anger or frustration noted by patient .  Voice no concerns around sleep. Attempting to feed self .   Problem: Education: Goal: Knowledge of [facility] Education information/materials will improve Outcome: Progressing Goal: Emotional status will improve Outcome: Progressing Goal: Mental status will improve Outcome: Progressing Goal: Verbalization of understanding the information provided will improve Outcome: Progressing   Problem: Activity: Goal: Interest or engagement in activities will improve Outcome: Progressing Goal: Sleeping patterns will improve Outcome: Progressing   Problem: Coping: Goal: Ability to verbalize frustrations and anger appropriately will improve Outcome: Progressing Goal: Ability to demonstrate self-control will improve Outcome: Progressing  Problem: Safety: Goal: Periods of time without injury will increase Outcome: Progressing

## 2019-02-08 NOTE — [facility] Group Notes (Signed)
[facility] Group Notes:  (Nursing/MHT/Case Management/Adjunct)  Date:  02/08/2019  Time:  8:54 PM  Type of Therapy:  Group Therapy  Participation Level:  Did Not Attend  Summary of Progress/Problems:  Troy Fox 02/08/2019, 8:54 PM

## 2019-02-08 NOTE — Progress Notes (Signed)
Continuous 1:1 monitoring  4:00  Head of bed slightly elevated Moving about freely in bed  Able to turn self , awake. Patient  Refused to eat dinner  Or drink fluids 5:00 Head of bed slightly elevated Moving about freely in bed  Able to turn self , awake 18:00 Head of bed slightly elevated Moving about freely in bed  Able to turn self , awake 19:00 Remains in bed  Awake moving  About in bed

## 2019-02-08 NOTE — Progress Notes (Signed)
11-3 pm Continuous Monitoring 1:1  11:00  Head of bed slightly elevated Moving about freely in bed  Able to turn self , Awake  12:00  Head of bed slightly elevated Moving about freely in bed  Able to turn self  Sleeping  Patient refused lunch  1:00 questioned patient  If needed to go to the bathroom  Or sit in chair  No response from patient  2:00   Head of bed slightly elevated Moving about freely in bed  Able to turn self , Awake  2:45 Out of bed to bathroom voided returned to bed  3:00 Head of bed slightly elevated Moving about freely in bed  Able to turn self , Awake

## 2019-02-08 NOTE — [facility] Group Notes (Signed)
Balance In Life 02/08/2019 1PM  Type of Therapy/Topic:  Group Therapy:  Balance in Life  Participation Level:  Did Not Attend  Description of Group:   This group will address the concept of balance and how it feels and looks when one is unbalanced. Patients will be encouraged to process areas in their lives that are out of balance and identify reasons for remaining unbalanced. Facilitators will guide patients in utilizing problem-solving interventions to address and correct the stressor making their life unbalanced. Understanding and applying boundaries will be explored and addressed for obtaining and maintaining a balanced life. Patients will be encouraged to explore ways to assertively make their unbalanced needs known to significant others in their lives, using other group members and facilitator for support and feedback.  Therapeutic Goals: 1. Patient will identify two or more emotions or situations they have that consume much of in their lives. 2. Patient will identify signs/triggers that life has become out of balance:  3. Patient will identify two ways to set boundaries in order to achieve balance in their lives:  4. Patient will demonstrate ability to communicate their needs through discussion and/or role plays  Summary of Patient Progress:    Therapeutic Modalities:   Cognitive Behavioral Therapy Solution-Focused Therapy Assertiveness Training  Serapio Edelson T Dacota Devall, LCSW  

## 2019-02-08 NOTE — Progress Notes (Signed)
Recreation Therapy Notes   Date: 02/08/2019  Time: 9:30 am   Location: Craft room   Behavioral response: N/A   Intervention Topic: Time Management  Discussion/Intervention: Patient did not attend group.   Clinical Observations/Feedback:  Patient did not attend group.   Niasia Lanphear LRT/CTRS        Edye Hainline 02/08/2019 12:02 PM

## 2019-02-08 NOTE — Progress Notes (Signed)
patient is up and out of bed , took  patient for a walk for 7 minutes , patient ambulates round the hall way twice until patient say he wants to go back to his room, patient sat down and able to eat some of his meals, patient is cooperating and with out any distress, 1&1 protocol for fall prevention is in place noted.

## 2019-02-08 NOTE — Progress Notes (Signed)
1:1 Patient safety monitoring   2300: Pt resting in bed with eyes closed. Respirations even and unlabored. No distress noted.   0000: Pt resting in bed with eyes closed. Respirations even and unlabored. No distress noted.   0100: Pt resting in bed with eyes closed. Respirations even and unlabored. No distress noted.   0200: Pt resting in bed with eyes closed. Respirations even and unlabored. No distress noted.

## 2019-02-08 NOTE — Plan of Care (Signed)
Patient is relaxed and comfortable and in bed , took his medication, with any side effects , patient is still on 1&1 for fall precautions , no distress.

## 2019-02-09 ENCOUNTER — Inpatient Hospital Stay: Payer: Medicare Other | Admitting: Anesthesiology

## 2019-02-09 LAB — GLUCOSE, CAPILLARY: Glucose-Capillary: 93 mg/dL (ref 70–99)

## 2019-02-09 MED ORDER — SUCCINYLCHOLINE CHLORIDE 20 MG/ML IJ SOLN
INTRAMUSCULAR | Status: DC | PRN
  Administered 2019-02-09: 90 mg via INTRAVENOUS

## 2019-02-09 MED ORDER — MIDAZOLAM HCL 2 MG/2ML IJ SOLN
INTRAMUSCULAR | Status: AC
  Filled 2019-02-09: qty 2

## 2019-02-09 MED ORDER — KETAMINE HCL 10 MG/ML IJ SOLN
INTRAMUSCULAR | Status: DC | PRN
  Administered 2019-02-09: 80 mg via INTRAVENOUS

## 2019-02-09 MED ORDER — SODIUM CHLORIDE 0.9 % IV SOLN
500.0000 mL | Freq: Once | INTRAVENOUS | Status: AC
  Administered 2019-02-14: 10:00:00 via INTRAVENOUS

## 2019-02-09 MED ORDER — KETAMINE HCL 50 MG/ML IJ SOLN
INTRAMUSCULAR | Status: AC
  Filled 2019-02-09: qty 10

## 2019-02-09 NOTE — Transfer of Care (Signed)
Immediate Anesthesia Transfer of Care Note  Patient: Troy Fox  Procedure(s) Performed: ECT TX  Patient Location: PACU  Anesthesia Type:General  Level of Consciousness: sedated  Airway & Oxygen Therapy: Patient Spontanous Breathing and Patient connected to face mask oxygen  Post-op Assessment: Report given to RN and Post -op Vital signs reviewed and stable  Post vital signs: Reviewed and stable  Last Vitals:  Vitals Value Taken Time  BP 163/110 02/09/2019 10:43 AM  Temp 37.2 C 02/09/2019 10:43 AM  Pulse 114 02/09/2019 10:45 AM  Resp 20 02/09/2019 10:45 AM  SpO2 99 % 02/09/2019 10:45 AM  Vitals shown include unvalidated device data.  Last Pain:  Vitals:   02/09/19 1043  TempSrc:   PainSc: Asleep         Complications: No apparent anesthesia complications

## 2019-02-09 NOTE — Progress Notes (Signed)
Patient is on NPO for the rest of the hight , ECT tomorrow morning.

## 2019-02-09 NOTE — Procedures (Signed)
ECT SERVICES Physician's Interval Evaluation & Treatment Note  Patient Identification: Troy Fox MRN:  [id number] Date of Evaluation:  02/09/2019 TX #: 4 no change to physical exam  MADRS:   MMSE:   P.E. Findings:  No change physical exam  Psychiatric Interval Note:  Patient is significantly more alert  Subjective:  Patient is a 48 y.o. male seen for evaluation for Electroconvulsive Therapy. Still not able to give subjective report  Treatment Summary:   []   Right Unilateral             [x]  Bilateral   % Energy : 1.0 ms 100%   Impedance: 1160 ohms  Seizure Energy Index: 17,073 V squared  Postictal Suppression Index: 91%  Seizure Concordance Index: 93%  Medications  Pre Shock: Ketamine 80 mg succinylcholine 90 mg  Post Shock: Versed 2 mg  Seizure Duration: 28 seconds EMG 31 seconds EEG   Comments: Treatment seems to have gone well we will proceed with similar parameters next treatment Monday  Lungs:  [x]   Clear to auscultation               []  Other:   Heart:    [x]   Regular rhythm             []  irregular rhythm    [x]   Previous H&P reviewed, patient examined and there are NO CHANGES                 []   Previous H&P reviewed, patient examined and there are changes noted.   Mordecai Rasmussen, MD 5/29/202010:36 AM

## 2019-02-09 NOTE — H&P (Signed)
Troy Fox is an 48 y.o. male.   Chief Complaint: Patient unable to make any comment.  Seems at times to be irritable and distressed but not indicating a specific area. HPI: History of schizoaffective disorder recurrent episodes of catatonia starting to show a little more activity  Past Medical History:  Diagnosis Date  . Bipolar 1 disorder (HCC)   . 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 GERD (gastroesophageal reflux disease)   . Nonverbal   . Schizoaffective disorder (HCC)   . Tardive dyskinesia     History reviewed. No pertinent surgical history.  Family History  Adopted: Yes  Family history unknown: Yes   Social History:  reports that he has never smoked. He has never used smokeless tobacco. He reports that he does not drink alcohol or use drugs.  Allergies: No Known Allergies  Medications Prior to Admission  Medication Sig Dispense Refill  . atenolol (TENORMIN) 25 MG tablet Take 1 tablet (25 mg total) by mouth daily. 90 tablet 0  . busPIRone (BUSPAR) 5 MG tablet Take 1 tablet (5 mg total) by mouth 2 (two) times daily. (Patient taking differently: Take 5 mg by mouth 2 (two) times daily as needed. ) 60 tablet 0  . Cyanocobalamin (VITAMIN B 12) 500 MCG TABS Take 500 mcg by mouth daily. 90 tablet 0  . LORazepam (ATIVAN) 0.5 MG tablet Take 1 tablet (0.5 mg total) by mouth every 8 (eight) hours as needed for anxiety. 90 tablet 2  . omeprazole (PRILOSEC) 40 MG capsule Take 1 capsule (40 mg total) by mouth daily. 90 capsule 0  . paliperidone (INVEGA) 6 MG 24 hr tablet Take 1 tablet (6 mg total) by mouth daily. 90 tablet 0  . polyethylene glycol (MIRALAX) 17 g packet Take 17 g by mouth daily as needed for moderate constipation. 72 packet 0  . sertraline (ZOLOFT) 50 MG tablet Take 3 tablets (150 mg total) by mouth daily. 270 tablet 0  . traZODone (DESYREL) 50 MG tablet Take 1 tablet (50 mg total) by mouth at bedtime. 90 tablet 0  . Valbenazine Tosylate (INGREZZA) 40 & 80 MG CPPK Take 40 mg  by mouth daily for 7 days, THEN 80 mg daily for 30 days. 30 each 0  . benztropine (COGENTIN) 1 MG tablet Take 1 tablet (1 mg total) by mouth 2 (two) times daily for 7 days. 14 tablet 0    Results for orders placed or performed during the hospital encounter of 01/29/19 (from the past 48 hour(s))  Glucose, capillary     Status: None   Collection Time: 02/09/19  6:23 AM  Result Value Ref Range   Glucose-Capillary 93 70 - 99 mg/dL   No results found.  Review of Systems  Unable to perform ROS: Psychiatric disorder    Blood pressure 112/76, pulse 96, temperature 98.4 F (36.9 C), temperature source Oral, resp. rate 17, height 5\' 5"  (1.651 m), weight 63.5 kg, SpO2 98 %. Physical Exam  Nursing note and vitals reviewed. Constitutional: He appears well-developed and well-nourished.  HENT:  Head: Normocephalic and atraumatic.  Eyes: Pupils are equal, round, and reactive to light. Conjunctivae are normal.  Neck: Normal range of motion.  Cardiovascular: Regular rhythm and normal heart sounds.  Respiratory: Effort normal. No respiratory distress.  GI: Soft.  Musculoskeletal: Normal range of motion.  Neurological: He is alert.  Skin: Skin is warm and dry.  Psychiatric: His affect is blunt. He is noncommunicative.     Assessment/Plan Continue bilateral ECT this time  substituting ketamine for better seizure quality.  Mordecai RasmussenJohn Eri Mcevers, MD 02/09/2019, 10:20 AM

## 2019-02-09 NOTE — Plan of Care (Signed)
Patient was resistive and got agitated to get into the chair for ECT.After ECT patient had 50% lunch and took medications.Patient smiled to staff got in the chair with minimal assistance for dinner.Patient ambulated to bath room today.Pleasant on approach.Support and encouragement given.

## 2019-02-09 NOTE — Anesthesia Postprocedure Evaluation (Signed)
Anesthesia Post Note  Patient: Troy Fox  Procedure(s) Performed: ECT TX  Patient location during evaluation: PACU Anesthesia Type: General Level of consciousness: awake and alert Pain management: pain level controlled Vital Signs Assessment: post-procedure vital signs reviewed and stable Respiratory status: spontaneous breathing, nonlabored ventilation and respiratory function stable Cardiovascular status: blood pressure returned to baseline and stable Postop Assessment: no apparent nausea or vomiting Anesthetic complications: no     Last Vitals:  Vitals:   02/09/19 1123 02/09/19 1133  BP: 116/84 117/85  Pulse: 92 96  Resp: 17 18  Temp:  36.7 C  SpO2: 95% 94%    Last Pain:  Vitals:   02/09/19 1133  TempSrc:   PainSc: 0-No pain                 Christia Reading

## 2019-02-09 NOTE — Anesthesia Post-op Follow-up Note (Signed)
Anesthesia QCDR form completed.        

## 2019-02-09 NOTE — Progress Notes (Signed)
Recreation Therapy Notes  Date: 02/09/2019  Time: 9:30 am   Location: Craft room   Behavioral response: N/A   Intervention Topic: Leisure  Discussion/Intervention: Patient did not attend group.   Clinical Observations/Feedback:  Patient did not attend group.   Laurren Lepkowski LRT/CTRS        Shakeena Kafer 02/09/2019 11:37 AM 

## 2019-02-09 NOTE — Progress Notes (Signed)
Patient slept through the night with out any fall , turned Q2hrs. And bottom inspected, cleaned with water and soap and padded dry , applied skin barrier cream to prevent breakdowns patient tolerated well no distress.

## 2019-02-09 NOTE — Progress Notes (Signed)
[facility]  02/09/2019 3:54 PM Jetty DuhamelStephen Solorio  MRN:  [id number] Subjective: Follow-up for this gentleman with schizoaffective disorder who remains mostly in a catatonic state.  He still spends virtually all of his day in bed.  Occasionally will speak a little bit to the nurses were working with him but not very much.  Still has never spoken a significant amount to me.  Affect mostly flat.  Has some stereotyped movements at times but little that seems like volitional behavior.  Occasionally especially after ECT treatments he will eat a little bit.  Patient had his fourth ECT treatment today.  Prior to treatment he was actually agitated and somewhat resistive on his way going there which I actually saw is a good thing because it showed more affect and behavior. Principal Problem: Schizoaffective disorder, depressive type (HCC) Diagnosis: Principal Problem:   Schizoaffective disorder, depressive type (HCC) Active Problems:   Catatonia   Mild malnutrition (HCC)  Total Time spent with patient: 30 minutes  Past Psychiatric History: Patient has a long history of chronic mental illness multiple hospitalizations but the current phase of illness has been persistent for months  Past Medical History:  Past Medical History:  Diagnosis Date  . Bipolar 1 disorder (HCC)   . 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 GERD (gastroesophageal reflux disease)   . Nonverbal   . Schizoaffective disorder (HCC)   . Tardive dyskinesia    History reviewed. No pertinent surgical history. Family History:  Family History  Adopted: Yes  Family history unknown: Yes   Family Psychiatric  History: See previous Social History:  Social History   Substance and Sexual Activity  Alcohol Use Never  . Frequency: Never     Social History   Substance and Sexual Activity  Drug Use Never    Social History   Socioeconomic History  . Marital status: Single    Spouse name: Not on file  . Number of children: Not on  file  . Years of education: Not on file  . Highest education level: Not on file  Occupational History  . Not on file  Social Needs  . Financial resource strain: Not on file  . Food insecurity:    Worry: Not on file    Inability: Not on file  . Transportation needs:    Medical: Not on file    Non-medical: Not on file  Tobacco Use  . Smoking status: Never Smoker  . Smokeless tobacco: Never Used  Substance and Sexual Activity  . Alcohol use: Never    Frequency: Never  . Drug use: Never  . Sexual activity: Not Currently  Lifestyle  . Physical activity:    Days per week: Not on file    Minutes per session: Not on file  . Stress: Not on file  Relationships  . Social connections:    Talks on phone: Not on file    Gets together: Not on file    Attends religious service: Not on file    Active member of club or organization: Not on file    Attends meetings of clubs or organizations: Not on file    Relationship status: Not on file  Other Topics Concern  . Not on file  Social History Narrative  . Not on file   Additional Social History:    Pain Medications: please see mar Prescriptions: please see mar Over the Counter: please see mar History of alcohol / drug use?: No history of alcohol / drug abuse Longest  period of sobriety (when/how long): NA                    Sleep: Fair  Appetite:  Fair  Current Medications: Current Facility-Administered Medications  Medication Dose Route Frequency Provider Last Rate Last Dose  . 0.9 %  sodium chloride infusion  500 mL Intravenous Once Rosario Duey T, MD      . acetaminophen (TYLENOL) tablet 650 mg  650 mg Oral Q6H PRN Gisselle Galvis T, MD      . alum & mag hydroxide-simeth (MAALOX/MYLANTA) 200-200-20 MG/5ML suspension 30 mL  30 mL Oral Q4H PRN Ladiamond Gallina T, MD      . asenapine (SAPHRIS) sublingual tablet 10 mg  10 mg Sublingual BID Emali Heyward, Jackquline Denmark, MD   10 mg at 02/09/19 1205  . magnesium hydroxide (MILK OF MAGNESIA)  suspension 30 mL  30 mL Oral Daily PRN Ketih Goodie T, MD      . ondansetron (ZOFRAN) injection 4 mg  4 mg Intravenous Once PRN Yves Dill, MD      . pantoprazole (PROTONIX) EC tablet 40 mg  40 mg Oral Daily Sairah Knobloch, Jackquline Denmark, MD   40 mg at 02/09/19 0749  . polyethylene glycol (MIRALAX / GLYCOLAX) packet 17 g  17 g Oral Daily Eldwin Volkov, Jackquline Denmark, MD   17 g at 02/09/19 1206  . traZODone (DESYREL) tablet 100 mg  100 mg Oral QHS Trason Shifflet, Jackquline Denmark, MD   100 mg at 02/08/19 2149  . vitamin B-12 (CYANOCOBALAMIN) tablet 1,000 mcg  1,000 mcg Oral Daily Sheronica Corey, Jackquline Denmark, MD   1,000 mcg at 02/09/19 1206    Lab Results:  Results for orders placed or performed during the hospital encounter of 01/29/19 (from the past 48 hour(s))  Glucose, capillary     Status: None   Collection Time: 02/09/19  6:23 AM  Result Value Ref Range   Glucose-Capillary 93 70 - 99 mg/dL    Blood Alcohol level:  Lab Results  Component Value Date   ETH <10 01/24/2019    Metabolic Disorder Labs: Lab Results  Component Value Date   HGBA1C 4.9 01/30/2019   MPG 93.93 01/30/2019   No results found for: PROLACTIN No results found for: CHOL, TRIG, HDL, CHOLHDL, VLDL, LDLCALC  Physical Findings: AIMS: Facial and Oral Movements Muscles of Facial Expression: None, normal Lips and Perioral Area: None, normal Jaw: None, normal Tongue: None, normal,Extremity Movements Upper (arms, wrists, hands, fingers): None, normal Lower (legs, knees, ankles, toes): None, normal, Trunk Movements Neck, shoulders, hips: None, normal, Overall Severity Severity of abnormal movements (highest score from questions above): None, normal Incapacitation due to abnormal movements: None, normal Patient's awareness of abnormal movements (rate only patient's report): No Awareness, Dental Status Current problems with teeth and/or dentures?: No Does patient usually wear dentures?: No  CIWA:    COWS:     Musculoskeletal: Strength & Muscle Tone: decreased  and atrophy Gait & Station: unsteady Patient leans: N/A  Psychiatric Specialty Exam: Physical Exam  Nursing note and vitals reviewed. Constitutional: He appears well-developed.  HENT:  Head: Normocephalic and atraumatic.  Eyes: Pupils are equal, round, and reactive to light. Conjunctivae are normal.  Neck: Normal range of motion.  Cardiovascular: Regular rhythm and normal heart sounds.  Respiratory: Effort normal. No respiratory distress.  GI: Soft.  Musculoskeletal: Normal range of motion.  Neurological: He is alert.  Weak and atrophied although part of that is probably from volitional behavior some of it is clear  muscle loss.  Skin: Skin is warm and dry.  Psychiatric: His affect is blunt. He is noncommunicative.    Review of Systems  Unable to perform ROS: Psychiatric disorder    Blood pressure 118/86, pulse 92, temperature 98.8 F (37.1 C), temperature source Oral, resp. rate 18, height 5\' 5"  (1.651 m), weight 63.5 kg, SpO2 98 %.Body mass index is 23.3 kg/m.  General Appearance: Disheveled  Eye Contact:  Minimal  Speech:  Blocked  Volume:  Decreased  Mood:  Negative  Affect:  Negative  Thought Process:  NA  Orientation:  Negative  Thought Content:  Negative  Suicidal Thoughts:  No  Homicidal Thoughts:  No  Memory:  Negative  Judgement:  Negative  Insight:  Negative  Psychomotor Activity:  Negative  Concentration:  Concentration: Poor  Recall:  Poor  Fund of Knowledge:  Negative  Language:  Negative  Akathisia:  Negative  Handed:  Right  AIMS (if indicated):     Assets:  Social Support  ADL's:  Impaired  Cognition:  Impaired,  Moderate and Severe  Sleep:  Number of Hours: 7.5     Treatment Plan Summary: Daily contact with patient to assess and evaluate symptoms and progress in treatment, Medication management and Plan Patient appears to be tolerating ECT and showing some gradual benefit.  Today's treatment was done using ketamine as the primary anesthetic  because his seizures had been getting shorter the last couple of times.  He had a much more adequate technical seizure this time which I am hoping proves to be more effective.  I will continue with ECT next week we will see him over the weekend.  Patient is still not able to take care of himself whatsoever requiring constant one-on-one supervision for safety.  Mordecai Rasmussen, MD 02/09/2019, 3:54 PM

## 2019-02-09 NOTE — Anesthesia Preprocedure Evaluation (Addendum)
Anesthesia Evaluation  Patient identified by MRN, date of birth, ID band Patient awake    Reviewed: Allergy & Precautions, H&P , NPO status , reviewed documented beta blocker date and time   Airway Mallampati: II  TM Distance: >3 FB Neck ROM: full    Dental  (+) Poor Dentition, Chipped   Pulmonary    Pulmonary exam normal        Cardiovascular Normal cardiovascular exam     Neuro/Psych PSYCHIATRIC DISORDERS Depression Bipolar Disorder Schizophrenia    GI/Hepatic GERD  ,  Endo/Other    Renal/GU      Musculoskeletal   Abdominal   Peds  Hematology   Anesthesia Other Findings Past Medical History: No date: Bipolar 1 disorder (HCC) No date: 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HCC) No date: GERD (gastroesophageal reflux disease) No date: Nonverbal No date: Schizoaffective disorder (HCC) No date: Tardive dyskinesia  History reviewed. No pertinent surgical history.  BMI    Body Mass Index:  23.30 kg/m      Reproductive/Obstetrics                             Anesthesia Physical Anesthesia Plan  ASA: III  Anesthesia Plan: General   Post-op Pain Management:    Induction: Intravenous  PONV Risk Score and Plan: Treatment may vary due to age or medical condition and TIVA  Airway Management Planned: Nasal Cannula and Natural Airway  Additional Equipment:   Intra-op Plan:   Post-operative Plan:   Informed Consent: I have reviewed the patients History and Physical, chart, labs and discussed the procedure including the risks, benefits and alternatives for the proposed anesthesia with the patient or authorized representative who has indicated his/her understanding and acceptance.     Dental Advisory Given  Plan Discussed with: CRNA  Anesthesia Plan Comments:         Anesthesia Quick Evaluation

## 2019-02-09 NOTE — [facility] Group Notes (Signed)
LCSW Group Therapy Note  02/09/2019 12:32 PM  Type of Therapy and Topic:  Group Therapy:  Feelings around Relapse and Recovery  Participation Level:  Did Not Attend   Description of Group:    Patients in this group will discuss emotions they experience before and after a relapse. They will process how experiencing these feelings, or avoidance of experiencing them, relates to having a relapse. Facilitator will guide patients to explore emotions they have related to recovery. Patients will be encouraged to process which emotions are more powerful. They will be guided to discuss the emotional reaction significant others in their lives may have to their relapse or recovery. Patients will be assisted in exploring ways to respond to the emotions of others without this contributing to a relapse.  Therapeutic Goals: 1. Patient will identify two or more emotions that lead to a relapse for them 2. Patient will identify two emotions that result when they relapse 3. Patient will identify two emotions related to recovery 4. Patient will demonstrate ability to communicate their needs through discussion and/or role plays   Summary of Patient Progress: x    Therapeutic Modalities:   Cognitive Behavioral Therapy Solution-Focused Therapy Assertiveness Training Relapse Prevention Therapy   Mallary Kreger, MSW, LCSW Clinical Social Work 02/09/2019 12:32 PM   

## 2019-02-09 NOTE — [facility] Group Notes (Signed)
[facility] Group Notes:  (Nursing/MHT/Case Management/Adjunct)  Date:  02/09/2019  Time:  3:25 PM  Type of Therapy:  Psychoeducational Skills  Participation Level:  Did Not Attend   Lynelle Smoke [facility] 02/09/2019, 3:25 PM

## 2019-02-10 NOTE — Progress Notes (Signed)
4-7 Continous 1:1 Monitoring  4:00 Patient in chair Moving arms and legs Able to turn self voice no concerns awake  5:00 Patient ate dinner 100% eaten  18;00 Out of bed in chair  Patient  Went  To bathroom on his own  Voided 19 Resting in bed  awake

## 2019-02-10 NOTE — Progress Notes (Signed)
11:00 AM - 3:00 PM 1:1 Continuous Monitoring   11:00 Patient remained in bed  Moving about freely  With arms and legs  Awake . Lunch served  Patient sat on side  Of bed ate  75% of meal.  12:00 Patient in bed  Moving arms and legs  Able to turn self  voice no concerns awake 13:00 Patient in bed  Moving arms and legs  Able to turn self  voice no concerns  awake 14:00 Patient in bed  Moving arms and legs  Able to turn self  voice no concerns awake  15:00Patient in bed  Moving arms and legs  Able to turn self  voice no concerns awake

## 2019-02-10 NOTE — Progress Notes (Signed)
Patient is alert to person, place and situation with periods of confusion to time, affect is brighter, he is S/P ECT  his thoughts are organized and coherent, noted sitting at bedside drinking fluids, he denies pain, able to articulate his point to writer, was able to eat 100% of evening snack and able to ambulate  to bathroom unassisted. Patient  Continues to be on 1:1 sitter at bedside, 15 minutes safety checks maintained will continue to monitor

## 2019-02-10 NOTE — Progress Notes (Signed)
1900-2300 1:1 Continuous monitoring 1900-Patient continues on 1:1 observation. MHT within reach.  Safety maintained. 2000-Ambulated around unit with MHT assistance. MHT within reach. Patient continues on 1:1 observation. Denies SI, HI and AV hallucinations. Safety maintained.  2100-Patient ate 100% of snack. Medication compliant. Continues 1:1 with MHT within reach.  2200-Patient resting in bed with eyes open. Making brief conversation. Denies SI. Continue 1:1. 2300-Patient resting in bed with eyes open. Denies any complaints. Safety maintained. Able to turn self. No redness or skin breaks Patient states he is able to make it to the bathroom with assistance. Continues 1:1. Safety maintained.

## 2019-02-10 NOTE — Progress Notes (Signed)
7-11 am 1:1 Continuous 7 am  Patient in bed  Moving arms and legs  Able to turn self   7: 25 am  Out of bed ambulated to bathroom  Patient refused  Breakfast tray  8 am  Patient in bed  Moving arms and legs  Able to turn self  awake at present time  9 am Patient in bed  Moving arms and legs  Able to turn self   Talking  With Dr. Toni Amend  10 am Patient in bed  Moving arms and legs  Able to turn self   Short  Development worker, international aid and staff 11 am Patient in bed  Moving arms and legs  Able to turn self  voice no concerns Writer checked buttocks no breaks in skins  Or readness

## 2019-02-10 NOTE — Progress Notes (Signed)
Condition unchanged; patient asleep, sitter at bedside, 15 minutes safety checks maintained will continue to monitor.

## 2019-02-10 NOTE — [facility] Group Notes (Signed)
LCSW Group Therapy Note  02/10/2019 1:15pm  Type of Therapy and Topic:  Group Therapy:  Cognitive Distortions  Participation Level:  Did Not Attend   Description of Group:    Patients in this group will be introduced to the topic of cognitive distortions.  Patients will identify and describe cognitive distortions, describe the feelings these distortions create for them.  Patients will identify one or more situations in their personal life where they have cognitively distorted thinking and will verbalize challenging this cognitive distortion through positive thinking skills.  Patients will practice the skill of using positive affirmations to challenge cognitive distortions using affirmation cards.    Therapeutic Goals:  1. Patient will identify two or more cognitive distortions they have used 2. Patient will identify one or more emotions that stem from use of a cognitive distortion 3. Patient will demonstrate use of a positive affirmation to counter a cognitive distortion through discussion and/or role play. 4. Patient will describe one way cognitive distortions can be detrimental to wellness   Summary of Patient Progress: Pt was invited to attend group but chose not to attend. CSW will continue to encourage pt to attend group throughout their admission.      Therapeutic Modalities:   Cognitive Behavioral Therapy Motivational Interviewing   Kearstyn Avitia  CUEBAS-COLON, LCSW 02/10/2019 12:38 PM

## 2019-02-10 NOTE — Plan of Care (Signed)
  Problem: Health Behavior/Discharge Planning: Goal: Compliance with treatment plan for underlying cause of condition will improve Outcome: Progressing  After ECT patient spoke with writer and expressed some insight into treatment plan

## 2019-02-10 NOTE — Progress Notes (Signed)
Patient noted in bed with eyes closed, chest expansion symmetrical no distress noted,  sitter at bedside, 15 minutes safety checks maintained will continue to monitor

## 2019-02-10 NOTE — Progress Notes (Signed)
[facility] MD Progress Note  02/10/2019 12:11 PM Troy Fox  MRN:  [id number] Subjective: Follow-up patient with schizoaffective disorder depressive catatonic state.  Patient seen chart reviewed.  Nursing reports that he has been doing better although he is still not eating much.  I went to see him today and was able to actually have a brief conversation with him which was different than any previous time I have seen him.  Still he only whispered and only answered a few questions.  Denied having any pain denied having any physical symptoms.  York Spaniel he was still not hungry.  Affect remains flat and blunted.  No aggression no threats.  Denied suicidal intent.  Could not be any more expansive about current symptoms.  Tolerating ECT and had a much better treatment technically yesterday than what we had gotten the 2 times before Principal Problem: Schizoaffective disorder, depressive type (HCC) Diagnosis: Principal Problem:   Schizoaffective disorder, depressive type (HCC) Active Problems:   Catatonia   Mild malnutrition (HCC)  Total Time spent with patient: 30 minutes  Past Psychiatric History: Patient has a history of longstanding schizoaffective disorder and current episode of severe depression and catatonia going on months  Past Medical History:  Past Medical History:  Diagnosis Date  . Bipolar 1 disorder (HCC)   . 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 GERD (gastroesophageal reflux disease)   . Nonverbal   . Schizoaffective disorder (HCC)   . Tardive dyskinesia    History reviewed. No pertinent surgical history. Family History:  Family History  Adopted: Yes  Family history unknown: Yes   Family Psychiatric  History: None Social History:  Social History   Substance and Sexual Activity  Alcohol Use Never  . Frequency: Never     Social History   Substance and Sexual Activity  Drug Use Never    Social History   Socioeconomic History  . Marital status: Single    Spouse name: Not on file   . Number of children: Not on file  . Years of education: Not on file  . Highest education level: Not on file  Occupational History  . Not on file  Social Needs  . Financial resource strain: Not on file  . Food insecurity:    Worry: Not on file    Inability: Not on file  . Transportation needs:    Medical: Not on file    Non-medical: Not on file  Tobacco Use  . Smoking status: Never Smoker  . Smokeless tobacco: Never Used  Substance and Sexual Activity  . Alcohol use: Never    Frequency: Never  . Drug use: Never  . Sexual activity: Not Currently  Lifestyle  . Physical activity:    Days per week: Not on file    Minutes per session: Not on file  . Stress: Not on file  Relationships  . Social connections:    Talks on phone: Not on file    Gets together: Not on file    Attends religious service: Not on file    Active member of club or organization: Not on file    Attends meetings of clubs or organizations: Not on file    Relationship status: Not on file  Other Topics Concern  . Not on file  Social History Narrative  . Not on file   Additional Social History:    Pain Medications: please see mar Prescriptions: please see mar Over the Counter: please see mar History of alcohol / drug use?: No history of  alcohol / drug abuse Longest period of sobriety (when/how long): NA                    Sleep: Fair  Appetite:  Poor  Current Medications: Current Facility-Administered Medications  Medication Dose Route Frequency Provider Last Rate Last Dose  . 0.9 %  sodium chloride infusion  500 mL Intravenous Once ,  T, MD      . acetaminophen (TYLENOL) tablet 650 mg  650 mg Oral Q6H PRN ,  T, MD      . alum & mag hydroxide-simeth (MAALOX/MYLANTA) 200-200-20 MG/5ML suspension 30 mL  30 mL Oral Q4H PRN ,  T, MD      . asenapine (SAPHRIS) sublingual tablet 10 mg  10 mg Sublingual BID , Jackquline Denmark, MD   10 mg at 02/10/19 0806  . magnesium  hydroxide (MILK OF MAGNESIA) suspension 30 mL  30 mL Oral Daily PRN ,  T, MD      . ondansetron (ZOFRAN) injection 4 mg  4 mg Intravenous Once PRN Yves Dill, MD      . pantoprazole (PROTONIX) EC tablet 40 mg  40 mg Oral Daily , Jackquline Denmark, MD   40 mg at 02/10/19 0806  . polyethylene glycol (MIRALAX / GLYCOLAX) packet 17 g  17 g Oral Daily , Jackquline Denmark, MD   17 g at 02/10/19 0806  . traZODone (DESYREL) tablet 100 mg  100 mg Oral QHS ,  T, MD   100 mg at 02/09/19 2222  . vitamin B-12 (CYANOCOBALAMIN) tablet 1,000 mcg  1,000 mcg Oral Daily , Jackquline Denmark, MD   1,000 mcg at 02/10/19 0806    Lab Results:  Results for orders placed or performed during the hospital encounter of 01/29/19 (from the past 48 hour(s))  Glucose, capillary     Status: None   Collection Time: 02/09/19  6:23 AM  Result Value Ref Range   Glucose-Capillary 93 70 - 99 mg/dL    Blood Alcohol level:  Lab Results  Component Value Date   ETH <10 01/24/2019    Metabolic Disorder Labs: Lab Results  Component Value Date   HGBA1C 4.9 01/30/2019   MPG 93.93 01/30/2019   No results found for: PROLACTIN No results found for: CHOL, TRIG, HDL, CHOLHDL, VLDL, LDLCALC  Physical Findings: AIMS: Facial and Oral Movements Muscles of Facial Expression: None, normal Lips and Perioral Area: None, normal Jaw: None, normal Tongue: None, normal,Extremity Movements Upper (arms, wrists, hands, fingers): None, normal Lower (legs, knees, ankles, toes): None, normal, Trunk Movements Neck, shoulders, hips: None, normal, Overall Severity Severity of abnormal movements (highest score from questions above): None, normal Incapacitation due to abnormal movements: None, normal Patient's awareness of abnormal movements (rate only patient's report): No Awareness, Dental Status Current problems with teeth and/or dentures?: No Does patient usually wear dentures?: No  CIWA:    COWS:      Musculoskeletal: Strength & Muscle Tone: decreased Gait & Station: unable to stand Patient leans: N/A  Psychiatric Specialty Exam: Physical Exam  Nursing note and vitals reviewed. Constitutional: He appears well-developed.  HENT:  Head: Normocephalic and atraumatic.  Eyes: Pupils are equal, round, and reactive to light. Conjunctivae are normal.  Neck: Normal range of motion.  Cardiovascular: Normal heart sounds.  Respiratory: Effort normal.  GI: Soft.  Musculoskeletal: Normal range of motion.  Neurological: He is alert. He exhibits abnormal muscle tone.  Skin: Skin is warm and dry.  Psychiatric: His affect is blunt. He  is noncommunicative.    Review of Systems  Unable to perform ROS: Psychiatric disorder    Blood pressure 119/82, pulse 81, temperature 98.6 F (37 C), temperature source Oral, resp. rate 18, height 5\' 5"  (1.651 m), weight 63.5 kg, SpO2 97 %.Body mass index is 23.3 kg/m.  General Appearance: Casual  Eye Contact:  Minimal  Speech:  Slow  Volume:  Decreased  Mood:  Euthymic  Affect:  Flat  Thought Process:  Coherent  Orientation:  Negative  Thought Content:  Negative  Suicidal Thoughts:  No  Homicidal Thoughts:  No  Memory:  Negative  Judgement:  Negative  Insight:  Negative  Psychomotor Activity:  Decreased  Concentration:  Concentration: Negative  Recall:  Negative  Fund of Knowledge:  Negative  Language:  Negative  Akathisia:  No  Handed:  Right  AIMS (if indicated):     Assets:  Desire for Improvement Financial Resources/Insurance Housing Physical Health Social Support  ADL's:  Impaired  Cognition:  Impaired,  Moderate  Sleep:  Number of Hours: 3     Treatment Plan Summary: Daily contact with patient to assess and evaluate symptoms and progress in treatment, Medication management and Plan As far as medication patient remains on Saphris.  Has had a previous good response to it.  Primary mode of treatment right now remains ECT.  He is on  the schedule for Monday Wednesday Friday treatment coming up this week.  Reminded patient of the plan.  Talk with him about the crucial importance of nutrition and hydration and ask him to please try to continue eating and to move around a bit.  Encouraged him to go outdoors later today.  We are keeping his family up-to-date about the situation.  Patient is still profoundly impaired and needs one-on-one care from the moment.  Mordecai RasmussenJohn , MD 02/10/2019, 12:11 PM

## 2019-02-10 NOTE — Plan of Care (Signed)
  Problem: Education: Goal: Knowledge of [facility] Education information/materials will improve Outcome: Progressing Goal: Emotional status will improve Outcome: Progressing Goal: Mental status will improve Outcome: Progressing Goal: Verbalization of understanding the information provided will improve Outcome: Progressing   D: Patient continues on 1:1 observation for safety. Able to ambulate around unit with MHT assist. Denies SI, HI, AVH. Gait continues to be unsteady. Patient ate 100 % of snack. Responds appropriately to questions with brief answers.  A: Continue to monitor 1:1 for safety. R: Safety maintained.

## 2019-02-11 ENCOUNTER — Other Ambulatory Visit: Payer: Self-pay | Admitting: Psychiatry

## 2019-02-11 NOTE — Progress Notes (Signed)
1:1 Continuous observation 1900-Patient is in bed with eyes open. Denies SI, HI and AVH. Affect is brighter. Continues on 1:1 for safety.  2000-Patient ate all of his snack. Up to walk to the bathroom. Required minimal assistance. Continues on 1:1 for safety.  2100-Patient compliant with medication. Continues on 1:1 for safety.  2200-Patient in bed with eyes open. Continues on 1:1 for safety. Voices no complaints. 2300-Patient awake in bed. Denies complaints. Continues on 1:1 for safety. 0000-0400-Patient resting in bed. Continues 1:1 for safety. 0400-0700-Patient resting in bed. Continues 1:1 for safety.

## 2019-02-11 NOTE — Plan of Care (Signed)
  Problem: Activity: Goal: Interest or engagement in activities will improve Outcome: Progressing Goal: Sleeping patterns will improve Outcome: Progressing   D: Patient has ambulated on the unit with assistance. More talkative. Affect is brighter. Mood is pleasant. Patient is cooperative. Denies SI, HI and AVH. A:Continue on 1:1 observation for safety. R: Safety maintained.

## 2019-02-11 NOTE — Progress Notes (Signed)
09:00 Patients affect is brighter and smiles at staff.Ambulates in the hallway with the sitter.  12:00 Patient had lunch in the day room.Appropriate with staff & peers.

## 2019-02-11 NOTE — Progress Notes (Signed)
[facility] MD Progress Note  02/11/2019 10:02 AM Troy Fox  MRN:  [id number] Subjective: Follow-up for this patient with catatonic depression of schizoaffective disorder.  Patient seen chart reviewed.  Patient ate a little bit for breakfast today.  I spoke with him today and he immediately responded to his name and made eye contact.  He was able to say to coherent words to me which is more than he has done in the past saying "Monday morning?"  About ECT.  Patient still stays in bed almost constantly speaks very little not able to give any review of systems.  Has not been aggressive or agitated today. Principal Problem: Schizoaffective disorder, depressive type (HCC) Diagnosis: Principal Problem:   Schizoaffective disorder, depressive type (HCC) Active Problems:   Catatonia   Mild malnutrition (HCC)  Total Time spent with patient: 20 minutes  Past Psychiatric History: Past history of schizoaffective disorder chronic with current catatonic state  Past Medical History:  Past Medical History:  Diagnosis Date  . Bipolar 1 disorder (HCC)   . 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 GERD (gastroesophageal reflux disease)   . Nonverbal   . Schizoaffective disorder (HCC)   . Tardive dyskinesia    History reviewed. No pertinent surgical history. Family History:  Family History  Adopted: Yes  Family history unknown: Yes   Family Psychiatric  History: See previous.  None known. Social History:  Social History   Substance and Sexual Activity  Alcohol Use Never  . Frequency: Never     Social History   Substance and Sexual Activity  Drug Use Never    Social History   Socioeconomic History  . Marital status: Single    Spouse name: Not on file  . Number of children: Not on file  . Years of education: Not on file  . Highest education level: Not on file  Occupational History  . Not on file  Social Needs  . Financial resource strain: Not on file  . Food insecurity:    Worry: Not on file   Inability: Not on file  . Transportation needs:    Medical: Not on file    Non-medical: Not on file  Tobacco Use  . Smoking status: Never Smoker  . Smokeless tobacco: Never Used  Substance and Sexual Activity  . Alcohol use: Never    Frequency: Never  . Drug use: Never  . Sexual activity: Not Currently  Lifestyle  . Physical activity:    Days per week: Not on file    Minutes per session: Not on file  . Stress: Not on file  Relationships  . Social connections:    Talks on phone: Not on file    Gets together: Not on file    Attends religious service: Not on file    Active member of club or organization: Not on file    Attends meetings of clubs or organizations: Not on file    Relationship status: Not on file  Other Topics Concern  . Not on file  Social History Narrative  . Not on file   Additional Social History:    Pain Medications: please see mar Prescriptions: please see mar Over the Counter: please see mar History of alcohol / drug use?: No history of alcohol / drug abuse Longest period of sobriety (when/how long): NA                    Sleep: Fair  Appetite:  Poor  Current Medications: Current Facility-Administered Medications  Medication Dose Route Frequency Provider Last Rate Last Dose  . 0.9 %  sodium chloride infusion  500 mL Intravenous Once Avedis Bevis T, MD      . acetaminophen (TYLENOL) tablet 650 mg  650 mg Oral Q6H PRN Odetta Forness T, MD      . alum & mag hydroxide-simeth (MAALOX/MYLANTA) 200-200-20 MG/5ML suspension 30 mL  30 mL Oral Q4H PRN Levell Tavano T, MD      . asenapine (SAPHRIS) sublingual tablet 10 mg  10 mg Sublingual BID Ferrah Panagopoulos, Jackquline DenmarkJohn T, MD   10 mg at 02/11/19 0851  . magnesium hydroxide (MILK OF MAGNESIA) suspension 30 mL  30 mL Oral Daily PRN Phyllicia Dudek T, MD      . ondansetron (ZOFRAN) injection 4 mg  4 mg Intravenous Once PRN Yves Dillarroll, Paul, MD      . pantoprazole (PROTONIX) EC tablet 40 mg  40 mg Oral Daily Wolfgang Finigan, Jackquline DenmarkJohn  T, MD   40 mg at 02/11/19 0851  . polyethylene glycol (MIRALAX / GLYCOLAX) packet 17 g  17 g Oral Daily Elke Holtry, Jackquline DenmarkJohn T, MD   17 g at 02/11/19 0851  . traZODone (DESYREL) tablet 100 mg  100 mg Oral QHS Zerrick Hanssen T, MD   100 mg at 02/10/19 2135  . vitamin B-12 (CYANOCOBALAMIN) tablet 1,000 mcg  1,000 mcg Oral Daily Christina Gintz, Jackquline DenmarkJohn T, MD   1,000 mcg at 02/11/19 16100851    Lab Results: No results found for this or any previous visit (from the past 48 hour(s)).  Blood Alcohol level:  Lab Results  Component Value Date   ETH <10 01/24/2019    Metabolic Disorder Labs: Lab Results  Component Value Date   HGBA1C 4.9 01/30/2019   MPG 93.93 01/30/2019   No results found for: PROLACTIN No results found for: CHOL, TRIG, HDL, CHOLHDL, VLDL, LDLCALC  Physical Findings: AIMS: Facial and Oral Movements Muscles of Facial Expression: None, normal Lips and Perioral Area: None, normal Jaw: None, normal Tongue: None, normal,Extremity Movements Upper (arms, wrists, hands, fingers): None, normal Lower (legs, knees, ankles, toes): None, normal, Trunk Movements Neck, shoulders, hips: None, normal, Overall Severity Severity of abnormal movements (highest score from questions above): None, normal Incapacitation due to abnormal movements: None, normal Patient's awareness of abnormal movements (rate only patient's report): No Awareness, Dental Status Current problems with teeth and/or dentures?: No Does patient usually wear dentures?: No  CIWA:    COWS:     Musculoskeletal: Strength & Muscle Tone: decreased Gait & Station: unsteady Patient leans: N/A  Psychiatric Specialty Exam: Physical Exam  Nursing note and vitals reviewed. Constitutional: He appears well-developed.  HENT:  Head: Normocephalic and atraumatic.  Eyes: Pupils are equal, round, and reactive to light. Conjunctivae are normal.  Neck: Normal range of motion.  Cardiovascular: Regular rhythm and normal heart sounds.  Respiratory:  Effort normal. No respiratory distress.  GI: Soft.  Musculoskeletal: Normal range of motion.  Neurological: He is alert. He exhibits abnormal muscle tone.  Skin: Skin is warm and dry.  Psychiatric: His affect is blunt. He is noncommunicative.    Review of Systems  Unable to perform ROS: Psychiatric disorder    Blood pressure 99/68, pulse 70, temperature 98.6 F (37 C), temperature source Oral, resp. rate 16, height 5\' 5"  (1.651 m), weight 63.5 kg, SpO2 99 %.Body mass index is 23.3 kg/m.  General Appearance: Casual  Eye Contact:  Fair  Speech:  Slow  Volume:  Decreased  Mood:  Negative  Affect:  Flat  Thought Process:  NA  Orientation:  Negative  Thought Content:  Negative  Suicidal Thoughts:  No  Homicidal Thoughts:  No  Memory:  Negative  Judgement:  Negative  Insight:  Negative  Psychomotor Activity:  Negative  Concentration:  Concentration: Poor  Recall:  Poor  Fund of Knowledge:  Negative  Language:  Negative  Akathisia:  Negative  Handed:  Right  AIMS (if indicated):     Assets:  Resilience Social Support  ADL's:  Impaired  Cognition:  Impaired,  Moderate  Sleep:  Number of Hours: 7.5     Treatment Plan Summary: Daily contact with patient to assess and evaluate symptoms and progress in treatment, Medication management and Plan Reviewed medications.  Nothing to change today.  He will have ECT tomorrow morning again.  He clearly is showing some improvement and we are hoping that now with the next few treatments we might see a rapid improvement.  He was able I think to understand what I was saying to him at least partially today and so I spent some time encouraging him to get up out of bed be more interactive with people.  Mordecai Rasmussen, MD 02/11/2019, 10:02 AM

## 2019-02-11 NOTE — [facility] Group Notes (Signed)
[facility] Group Notes:  (Nursing/MHT/Case Management/Adjunct)  Date:  02/11/2019  Time:  9:42 PM  Type of Therapy:  Group Therapy  Participation Level:  Did Not Attend  Summary of Progress/Problems:  Troy Fox 02/11/2019, 9:42 PM

## 2019-02-11 NOTE — [facility] Group Notes (Signed)
LCSW Group Therapy Note 02/11/2019 1:15pm  Type of Therapy and Topic: Group Therapy: Feelings Around Returning Home & Establishing a Supportive Framework and Supporting Oneself When Supports Not Available  Participation Level: Did Not Attend  Description of Group:  Patients first processed thoughts and feelings about upcoming discharge. These included fears of upcoming changes, lack of change, new living environments, judgements and expectations from others and overall stigma of mental health issues. The group then discussed the definition of a supportive framework, what that looks and feels like, and how do to discern it from an unhealthy non-supportive network. The group identified different types of supports as well as what to do when your family/friends are less than helpful or unavailable  Therapeutic Goals  1. Patient will identify one healthy supportive network that they can use at discharge. 2. Patient will identify one factor of a supportive framework and how to tell it from an unhealthy network. 3. Patient able to identify one coping skill to use when they do not have positive supports from others. 4. Patient will demonstrate ability to communicate their needs through discussion and/or role plays.  Summary of Patient Progress:  Pt was invited to attend group but chose not to attend. CSW will continue to encourage pt to attend group throughout their admission.   Therapeutic Modalities Cognitive Behavioral Therapy Motivational Interviewing   Troy Fox  CUEBAS-COLON, LCSW 02/11/2019 10:31 AM

## 2019-02-11 NOTE — Plan of Care (Signed)
02:00 Patient in bed.Keep smiling and resisting to get out of bed to go outside even with much encouragement.  Patient up in the wheelchair for dinner.Patient is pleasant on approach.No irritable behaviors noted.Safety sitter with patient.

## 2019-02-11 NOTE — Progress Notes (Signed)
0000-0700 Continuous 1:1 observation 0000-Patient resting in bed with eyes closed. MHT within reach. Safety maintained. 0100-Patient resting in bed with eyes closed. MHT within reach. Safety maintained. 0200-Patient resting in bed with eyes closed. MHT within reach. Safety maintained. 0300-Patient resting in bed with eyes closed. MHT within reach. Safety maintained. 0400-Patient resting in bed with eyes closed. MHT within reach. Safety maintained. 0500-Patient resting in bed with eyes closed. MHT within reach. Safety maintained 0600-Patient resting in bed. Vital signs taken. MHT within reach. Safety maintained. 0700-Patient resting in bed. Denies SI. MHT within reach. Safety maintained.

## 2019-02-12 ENCOUNTER — Inpatient Hospital Stay: Payer: Medicare Other | Admitting: Anesthesiology

## 2019-02-12 ENCOUNTER — Inpatient Hospital Stay (HOSPITAL_BASED_OUTPATIENT_CLINIC_OR_DEPARTMENT_OTHER): Admit: 2019-02-12 | Discharge: 2019-02-12 | Disposition: A | Discharge status: Home / Self Care | Payer: Medicare Other

## 2019-02-12 DIAGNOSIS — F251 Schizoaffective disorder, depressive type: Secondary | ICD-10-CM | POA: Diagnosis not present

## 2019-02-12 LAB — GLUCOSE, CAPILLARY: Glucose-Capillary: 92 mg/dL (ref 70–99)

## 2019-02-12 MED ORDER — SUCCINYLCHOLINE CHLORIDE 200 MG/10ML IV SOSY
PREFILLED_SYRINGE | INTRAVENOUS | Status: DC | PRN
  Administered 2019-02-12: 90 mg via INTRAVENOUS

## 2019-02-12 MED ORDER — SUCCINYLCHOLINE CHLORIDE 20 MG/ML IJ SOLN
INTRAMUSCULAR | Status: AC
  Filled 2019-02-12: qty 1

## 2019-02-12 MED ORDER — METHOHEXITAL SODIUM 100 MG/10ML IV SOSY
PREFILLED_SYRINGE | INTRAVENOUS | Status: DC | PRN
  Administered 2019-02-12: 70 mg via INTRAVENOUS

## 2019-02-12 MED ORDER — METHOHEXITAL SODIUM 0.5 G IJ SOLR
INTRAMUSCULAR | Status: AC
  Filled 2019-02-12: qty 500

## 2019-02-12 NOTE — Anesthesia Postprocedure Evaluation (Signed)
Anesthesia Post Note  Patient: Troy Fox  Procedure(s) Performed: ECT TX  Patient location during evaluation: PACU Anesthesia Type: General Level of consciousness: awake and alert Pain management: pain level controlled Vital Signs Assessment: post-procedure vital signs reviewed and stable Respiratory status: spontaneous breathing, nonlabored ventilation, respiratory function stable and patient connected to nasal cannula oxygen Cardiovascular status: blood pressure returned to baseline and stable Postop Assessment: no apparent nausea or vomiting Anesthetic complications: no     Last Vitals:  Vitals:   02/12/19 1048 02/12/19 1058  BP: 130/78 133/83  Pulse: (!) 108 74  Resp: (!) 21 16  Temp:  36.7 C  SpO2: 94% 94%    Last Pain:  Vitals:   02/12/19 1048  TempSrc:   PainSc: Asleep                 Medardo Hassing S

## 2019-02-12 NOTE — Plan of Care (Signed)
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 Compliant with  medications and meals. NPO this morning for ECT procedure. Remains on 1:1 for safety. Milieu remains safe with q 15 minute safety checks.

## 2019-02-12 NOTE — Progress Notes (Signed)
Safety Note:  Patient up for dinner, ambulated to the community room to eat his dinner there. Affect is pleasant and brightens upon approach. Interacting more with staff not and engages in conversation. Denies pain. Safety sitter at bedside.

## 2019-02-12 NOTE — Progress Notes (Signed)
Safety Note:  Patient remains on 1:1 for safety reasons. Patient asleep at this time with sitter at his bedside. He is NPO for scheduled ECT this morning. Does not display any  signs of distress.

## 2019-02-12 NOTE — Procedures (Signed)
ECT SERVICES Physician's Interval Evaluation & Treatment Note  Patient Identification: Kamori Orebaugh MRN:  [id number] Date of Evaluation:  02/12/2019 TX #: 5  MADRS:   MMSE:   P.E. Findings:  No change to physical exam  Psychiatric Interval Note:  Looking significantly better.  Alert oriented able to speak normally  Subjective:  Patient is a 48 y.o. male seen for evaluation for Electroconvulsive Therapy. No real complaints  Treatment Summary:   []   Right Unilateral             [x]  Bilateral   % Energy : 1.0 ms 100%   Impedance: 1330 ohms  Seizure Energy Index: 32,129 V squared  Postictal Suppression Index: 91%  Seizure Concordance Index: 98%  Medications  Pre Shock: Ketamine 80 mg succinylcholine 90 mg  Post Shock: Versed 2 mg  Seizure Duration: EMG 24 seconds EEG 26 seconds   Comments: Continue inpatient bilateral treatment next treatment Wednesday  Lungs:  [x]   Clear to auscultation               []  Other:   Heart:    [x]   Regular rhythm             []  irregular rhythm    [x]   Previous H&P reviewed, patient examined and there are NO CHANGES                 []   Previous H&P reviewed, patient examined and there are changes noted.   Mordecai Rasmussen, MD 6/1/202010:32 AM

## 2019-02-12 NOTE — Progress Notes (Signed)
Patient returned to unit after having ECT procedure done. Patient alert and oriented. Ambulatory with assistance with a steady gait.  Will continue to monitor. Unable to assess orientation and pain at this time, patient is mute. Will continue to assess and monitor.

## 2019-02-12 NOTE — H&P (Signed)
Troy Fox is an 48 y.o. male.   Chief Complaint: recognizes being better HPI: severe depression with catatonia  Past Medical History:  Diagnosis Date  . Bipolar 1 disorder (HCC)   . 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 GERD (gastroesophageal reflux disease)   . Nonverbal   . Schizoaffective disorder (HCC)   . Tardive dyskinesia     No past surgical history on file.  Family History  Adopted: Yes  Family history unknown: Yes   Social History:  reports that he has never smoked. He has never used smokeless tobacco. He reports that he does not drink alcohol or use drugs.  Allergies: No Known Allergies  (Not in a hospital admission)   Results for orders placed or performed during the hospital encounter of 01/29/19 (from the past 48 hour(s))  Glucose, capillary     Status: None   Collection Time: 02/12/19  6:30 AM  Result Value Ref Range   Glucose-Capillary 92 70 - 99 mg/dL   No results found.  Review of Systems  Constitutional: Negative.   HENT: Negative.   Eyes: Negative.   Respiratory: Negative.   Cardiovascular: Negative.   Gastrointestinal: Negative.   Musculoskeletal: Negative.   Skin: Negative.   Neurological: Negative.   Psychiatric/Behavioral: Negative.     There were no vitals taken for this visit. Physical Exam  Nursing note and vitals reviewed. Constitutional: He appears well-developed and well-nourished.  HENT:  Head: Normocephalic and atraumatic.  Eyes: Pupils are equal, round, and reactive to light. Conjunctivae are normal.  Neck: Normal range of motion.  Cardiovascular: Normal heart sounds.  Respiratory: Effort normal.  GI: Soft.  Musculoskeletal: Normal range of motion.  Neurological: He is alert.  Skin: Skin is warm and dry.  Psychiatric: His affect is blunt. His speech is delayed. He is slowed and withdrawn. Cognition and memory are impaired. He expresses impulsivity. He expresses no suicidal ideation.     Assessment/Plan Continue  inpatient index  Mordecai Rasmussen, MD 02/12/2019, 9:54 AM

## 2019-02-12 NOTE — Progress Notes (Signed)
Safety sitter:  Patient remains on 1:1 for safety. Lying in bed resting with his eyes closed. Meal eaten and po fluids encouraged. Will continue to monitor.

## 2019-02-12 NOTE — Anesthesia Preprocedure Evaluation (Signed)
Anesthesia Evaluation  Patient identified by MRN, date of birth, ID band Patient awake    Reviewed: Allergy & Precautions, NPO status , Patient's Chart, lab work & pertinent test results, reviewed documented beta blocker date and time   Airway Mallampati: II  TM Distance: >3 FB     Dental  (+) Chipped   Pulmonary           Cardiovascular      Neuro/Psych PSYCHIATRIC DISORDERS Depression Bipolar Disorder Schizophrenia    GI/Hepatic GERD  Controlled,  Endo/Other    Renal/GU      Musculoskeletal   Abdominal   Peds  Hematology   Anesthesia Other Findings   Reproductive/Obstetrics                             Anesthesia Physical Anesthesia Plan  ASA: III  Anesthesia Plan: General   Post-op Pain Management:    Induction: Intravenous  PONV Risk Score and Plan:   Airway Management Planned:   Additional Equipment:   Intra-op Plan:   Post-operative Plan:   Informed Consent: I have reviewed the patients History and Physical, chart, labs and discussed the procedure including the risks, benefits and alternatives for the proposed anesthesia with the patient or authorized representative who has indicated his/her understanding and acceptance.       Plan Discussed with: CRNA  Anesthesia Plan Comments:         Anesthesia Quick Evaluation

## 2019-02-12 NOTE — Anesthesia Procedure Notes (Signed)
Date/Time: 02/12/2019 10:29 AM Performed by: Lily Kocher, CRNA Pre-anesthesia Checklist: Patient identified, Emergency Drugs available, Suction available and Patient being monitored Patient Re-evaluated:Patient Re-evaluated prior to induction Oxygen Delivery Method: Circle system utilized Preoxygenation: Pre-oxygenation with 100% oxygen Induction Type: IV induction Ventilation: Mask ventilation without difficulty and Mask ventilation throughout procedure Airway Equipment and Method: Bite block Placement Confirmation: positive ETCO2 Dental Injury: Teeth and Oropharynx as per pre-operative assessment

## 2019-02-12 NOTE — Anesthesia Post-op Follow-up Note (Signed)
Anesthesia QCDR form completed.        

## 2019-02-12 NOTE — [facility] Group Notes (Signed)
Overcoming Obstacles  02/12/2019 1PM  Type of Therapy and Topic:  Group Therapy:  Overcoming Obstacles  Participation Level:  Did Not Attend    Description of Group:    In this group patients will be encouraged to explore what they see as obstacles to their own wellness and recovery. They will be guided to discuss their thoughts, feelings, and behaviors related to these obstacles. The group will process together ways to cope with barriers, with attention given to specific choices patients can make. Each patient will be challenged to identify changes they are motivated to make in order to overcome their obstacles. This group will be process-oriented, with patients participating in exploration of their own experiences as well as giving and receiving support and challenge from other group members.   Therapeutic Goals: 1. Patient will identify personal and current obstacles as they relate to admission. 2. Patient will identify barriers that currently interfere with their wellness or overcoming obstacles.  3. Patient will identify feelings, thought process and behaviors related to these barriers. 4. Patient will identify two changes they are willing to make to overcome these obstacles:      Summary of Patient Progress     Therapeutic Modalities:   Cognitive Behavioral Therapy Solution Focused Therapy Motivational Interviewing Relapse Prevention Therapy    Troy Fox, MSW, LCSW 02/12/2019 2:30 PM

## 2019-02-12 NOTE — Progress Notes (Signed)
[facility]  02/12/2019 3:35 PM Troy DuhamelStephen Fox  MRN:  [id number] Subjective: Patient seen today.  Patient had ECT this morning.  Bilateral treatment which was well tolerated without any complication.  Prior to treatment we got a chance to see that he is definitely getting better.  Patient was up walking around independently and stable on his feet.  He was actually conversant and able to answer questions.  He was cooperative with treatment.  He was alert and oriented and understood that he was getting ECT.  This afternoon he is sedated from the treatment but I am anticipating that by tomorrow he should be even better than he was today.  Clearly showing improvement with ECT.  Not yet certainly at baseline and we will continue with next treatment Wednesday and treatment scheduled for Friday as well.  He is currently on Saphris as a medication treatment for his illness and we will leave that as it is for now.  Encourage patient to eat well.  Full treatment team reviewed the current problems. Principal Problem: Schizoaffective disorder, depressive type (HCC) Diagnosis: Principal Problem:   Schizoaffective disorder, depressive type (HCC) Active Problems:   Catatonia   Mild malnutrition (HCC)  Total Time spent with patient: 30 minutes  Past Psychiatric History: Past history of chronic schizoaffective disorder.  Previous good response to ECT  Past Medical History:  Past Medical History:  Diagnosis Date  . Bipolar 1 disorder (HCC)   . 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 GERD (gastroesophageal reflux disease)   . Nonverbal   . Schizoaffective disorder (HCC)   . Tardive dyskinesia    History reviewed. No pertinent surgical history. Family History:  Family History  Adopted: Yes  Family history unknown: Yes   Family Psychiatric  History: None Social History:  Social History   Substance and Sexual Activity  Alcohol Use Never  . Frequency: Never     Social History   Substance and Sexual  Activity  Drug Use Never    Social History   Socioeconomic History  . Marital status: Single    Spouse name: Not on file  . Number of children: Not on file  . Years of education: Not on file  . Highest education level: Not on file  Occupational History  . Not on file  Social Needs  . Financial resource strain: Not on file  . Food insecurity:    Worry: Not on file    Inability: Not on file  . Transportation needs:    Medical: Not on file    Non-medical: Not on file  Tobacco Use  . Smoking status: Never Smoker  . Smokeless tobacco: Never Used  Substance and Sexual Activity  . Alcohol use: Never    Frequency: Never  . Drug use: Never  . Sexual activity: Not Currently  Lifestyle  . Physical activity:    Days per week: Not on file    Minutes per session: Not on file  . Stress: Not on file  Relationships  . Social connections:    Talks on phone: Not on file    Gets together: Not on file    Attends religious service: Not on file    Active member of club or organization: Not on file    Attends meetings of clubs or organizations: Not on file    Relationship status: Not on file  Other Topics Concern  . Not on file  Social History Narrative  . Not on file   Additional Social History:  Pain Medications: please see mar Prescriptions: please see mar Over the Counter: please see mar History of alcohol / drug use?: No history of alcohol / drug abuse Longest period of sobriety (when/how long): NA                    Sleep: Fair  Appetite:  Fair  Current Medications: Current Facility-Administered Medications  Medication Dose Route Frequency Provider Last Rate Last Dose  . 0.9 %  sodium chloride infusion  500 mL Intravenous Once Clapacs, John T, MD      . acetaminophen (TYLENOL) tablet 650 mg  650 mg Oral Q6H PRN Clapacs, John T, MD      . alum & mag hydroxide-simeth (MAALOX/MYLANTA) 200-200-20 MG/5ML suspension 30 mL  30 mL Oral Q4H PRN Clapacs, John T, MD       . asenapine (SAPHRIS) sublingual tablet 10 mg  10 mg Sublingual BID Clapacs, Jackquline Denmark, MD   10 mg at 02/12/19 0806  . magnesium hydroxide (MILK OF MAGNESIA) suspension 30 mL  30 mL Oral Daily PRN Clapacs, John T, MD      . ondansetron (ZOFRAN) injection 4 mg  4 mg Intravenous Once PRN Yves Dill, MD      . pantoprazole (PROTONIX) EC tablet 40 mg  40 mg Oral Daily Clapacs, Jackquline Denmark, MD   40 mg at 02/12/19 1211  . polyethylene glycol (MIRALAX / GLYCOLAX) packet 17 g  17 g Oral Daily Clapacs, Jackquline Denmark, MD   17 g at 02/12/19 1212  . traZODone (DESYREL) tablet 100 mg  100 mg Oral QHS Clapacs, Jackquline Denmark, MD   100 mg at 02/11/19 2128  . vitamin B-12 (CYANOCOBALAMIN) tablet 1,000 mcg  1,000 mcg Oral Daily Clapacs, Jackquline Denmark, MD   1,000 mcg at 02/12/19 1211    Lab Results:  Results for orders placed or performed during the hospital encounter of 01/29/19 (from the past 48 hour(s))  Glucose, capillary     Status: None   Collection Time: 02/12/19  6:30 AM  Result Value Ref Range   Glucose-Capillary 92 70 - 99 mg/dL    Blood Alcohol level:  Lab Results  Component Value Date   ETH <10 01/24/2019    Metabolic Disorder Labs: Lab Results  Component Value Date   HGBA1C 4.9 01/30/2019   MPG 93.93 01/30/2019   No results found for: PROLACTIN No results found for: CHOL, TRIG, HDL, CHOLHDL, VLDL, LDLCALC  Physical Findings: AIMS: Facial and Oral Movements Muscles of Facial Expression: None, normal Lips and Perioral Area: None, normal Jaw: None, normal Tongue: None, normal,Extremity Movements Upper (arms, wrists, hands, fingers): None, normal Lower (legs, knees, ankles, toes): None, normal, Trunk Movements Neck, shoulders, hips: None, normal, Overall Severity Severity of abnormal movements (highest score from questions above): None, normal Incapacitation due to abnormal movements: None, normal Patient's awareness of abnormal movements (rate only patient's report): No Awareness, Dental Status Current  problems with teeth and/or dentures?: No Does patient usually wear dentures?: No  CIWA:    COWS:     Musculoskeletal: Strength & Muscle Tone: within normal limits Gait & Station: normal Patient leans: N/A  Psychiatric Specialty Exam: Physical Exam  Nursing note and vitals reviewed. Constitutional: He appears well-developed and well-nourished.  HENT:  Head: Normocephalic and atraumatic.  Eyes: Pupils are equal, round, and reactive to light. Conjunctivae are normal.  Neck: Normal range of motion.  Cardiovascular: Regular rhythm and normal heart sounds.  Respiratory: Effort normal. No respiratory distress.  GI: Soft.  Musculoskeletal: Normal range of motion.  Neurological: He is alert.  Skin: Skin is warm and dry.  Psychiatric: Judgment normal. His affect is blunt. His speech is delayed. He is slowed. Thought content is not paranoid. He expresses no homicidal and no suicidal ideation. He exhibits abnormal recent memory.    Review of Systems  Constitutional: Negative.   HENT: Negative.   Eyes: Negative.   Respiratory: Negative.   Cardiovascular: Negative.   Gastrointestinal: Negative.   Musculoskeletal: Negative.   Skin: Negative.   Neurological: Negative.   Psychiatric/Behavioral: Negative for depression, hallucinations, substance abuse and suicidal ideas. The patient is not nervous/anxious.     Blood pressure (!) 114/56, pulse (!) 102, temperature (!) 97.5 F (36.4 C), temperature source Oral, resp. rate 18, height 5\' 5"  (1.651 m), weight 63.5 kg, SpO2 100 %.Body mass index is 23.3 kg/m.  General Appearance: Casual  Eye Contact:  Minimal  Speech:  Slow  Volume:  Decreased  Mood:  Euthymic  Affect:  Constricted and Flat  Thought Process:  Coherent  Orientation:  Full (Time, Place, and Person)  Thought Content:  Tangential  Suicidal Thoughts:  No  Homicidal Thoughts:  No  Memory:  Immediate;   Fair Recent;   Poor Remote;   Good  Judgement:  Fair  Insight:  Fair   Psychomotor Activity:  Decreased  Concentration:  Concentration: Poor  Recall:  [employer] of Knowledge:  Fair  Language:  Fair  Akathisia:  No  Handed:  Right  AIMS (if indicated):     Assets:  Desire for Improvement Financial Resources/Insurance Housing Resilience Social Support  ADL's:  Impaired  Cognition:  Impaired,  Moderate  Sleep:  Number of Hours: 2.3     Treatment Plan Summary: Daily contact with patient to assess and evaluate symptoms and progress in treatment, Medication management and Plan As noted above we will continue with the primary treatment modality of ECT.  No change to medication today.  Tried to spend some time doing psychoeducation and encouragement with the patient.  We will continue the one-to-one for now until we are sure that he is more consistently ambulatory and safe on his own.  Mordecai Rasmussen, MD 02/12/2019, 3:35 PM

## 2019-02-12 NOTE — Progress Notes (Signed)
No noticeable side effects from ECT, patient is responding at base line , and has been stable and eating well and hydrating with fluids and juices, 1&1 in place no acute distress noted.

## 2019-02-12 NOTE — Transfer of Care (Signed)
Immediate Anesthesia Transfer of Care Note  Patient: Troy Fox  Procedure(s) Performed: ECT TX  Patient Location: PACU  Anesthesia Type:General  Level of Consciousness: sedated  Airway & Oxygen Therapy: Patient Spontanous Breathing and Patient connected to face mask oxygen  Post-op Assessment: Report given to RN and Post -op Vital signs reviewed and stable  Post vital signs: Reviewed and stable  Last Vitals:  Vitals Value Taken Time  BP 134/92 02/12/2019 10:38 AM  Temp    Pulse 82 02/12/2019 10:39 AM  Resp 19 02/12/2019 10:39 AM  SpO2 100 % 02/12/2019 10:39 AM  Vitals shown include unvalidated device data.  Last Pain:  Vitals:   02/12/19 1027  TempSrc:   PainSc: 0-No pain         Complications: No apparent anesthesia complications

## 2019-02-13 ENCOUNTER — Other Ambulatory Visit: Payer: Self-pay | Admitting: Psychiatry

## 2019-02-13 NOTE — Progress Notes (Signed)
Recreation Therapy Notes  Date: 02/13/2019  Time: 9:30 am   Location: Craft room   Behavioral response: N/A   Intervention Topic: Anger Management  Discussion/Intervention: Patient did not attend group.   Clinical Observations/Feedback:  Patient did not attend group.   Roza Creamer LRT/CTRS        Troy Fox 02/13/2019 10:39 AM

## 2019-02-13 NOTE — Progress Notes (Signed)
7 PM 1:1 Continuous Monitoring  Patient hit staff twice  This evening  Patient wanted to do what he wanted.

## 2019-02-13 NOTE — Plan of Care (Signed)
Patient was able to answer more questions with this writer than he has been able to in the past. Patient is showing improvement.   Problem: Education: Goal: Emotional status will improve Outcome: Progressing Goal: Mental status will improve Outcome: Progressing

## 2019-02-13 NOTE — Progress Notes (Signed)
Hourly rounding reveals patient is sleeping comfortable with out any distress and maintaining 1&1 sitter in room with patient for safety , no noticeable side effects from post ECT treatment noted.

## 2019-02-13 NOTE — Progress Notes (Signed)
[facility] MD Progress Note  02/13/2019 5:36 PM Troy Fox  MRN:  [id number] Subjective: Patient seen and chart reviewed.  Patient with schizoaffective disorder who had been catatonic receiving ECT.  I am a little disappointed that today he is not more energetic.  He is still awake making eye contact and speaking but only a few words to me.  Not getting up out of bed.  He is able to give adequate answers yes or no to a few questions and denies suicidal thoughts and denies hallucinations. Principal Problem: Schizoaffective disorder, depressive type (HCC) Diagnosis: Principal Problem:   Schizoaffective disorder, depressive type (HCC) Active Problems:   Catatonia   Mild malnutrition (HCC)  Total Time spent with patient: 30 minutes  Past Psychiatric History: Patient has a history of schizoaffective disorder with long stability but recent decompensation.  Previous good response to ECT  Past Medical History:  Past Medical History:  Diagnosis Date  . Bipolar 1 disorder (HCC)   . 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 GERD (gastroesophageal reflux disease)   . Nonverbal   . Schizoaffective disorder (HCC)   . Tardive dyskinesia    History reviewed. No pertinent surgical history. Family History:  Family History  Adopted: Yes  Family history unknown: Yes   Family Psychiatric  History: None Social History:  Social History   Substance and Sexual Activity  Alcohol Use Never  . Frequency: Never     Social History   Substance and Sexual Activity  Drug Use Never    Social History   Socioeconomic History  . Marital status: Single    Spouse name: Not on file  . Number of children: Not on file  . Years of education: Not on file  . Highest education level: Not on file  Occupational History  . Not on file  Social Needs  . Financial resource strain: Not on file  . Food insecurity:    Worry: Not on file    Inability: Not on file  . Transportation needs:    Medical: Not on file   Non-medical: Not on file  Tobacco Use  . Smoking status: Never Smoker  . Smokeless tobacco: Never Used  Substance and Sexual Activity  . Alcohol use: Never    Frequency: Never  . Drug use: Never  . Sexual activity: Not Currently  Lifestyle  . Physical activity:    Days per week: Not on file    Minutes per session: Not on file  . Stress: Not on file  Relationships  . Social connections:    Talks on phone: Not on file    Gets together: Not on file    Attends religious service: Not on file    Active member of club or organization: Not on file    Attends meetings of clubs or organizations: Not on file    Relationship status: Not on file  Other Topics Concern  . Not on file  Social History Narrative  . Not on file   Additional Social History:    Pain Medications: please see mar Prescriptions: please see mar Over the Counter: please see mar History of alcohol / drug use?: No history of alcohol / drug abuse Longest period of sobriety (when/how long): NA                    Sleep: Fair  Appetite:  Fair  Current Medications: Current Facility-Administered Medications  Medication Dose Route Frequency Provider Last Rate Last Dose  . 0.9 %  sodium  chloride infusion  500 mL Intravenous Once ,  T, MD      . acetaminophen (TYLENOL) tablet 650 mg  650 mg Oral Q6H PRN ,  T, MD      . alum & mag hydroxide-simeth (MAALOX/MYLANTA) 200-200-20 MG/5ML suspension 30 mL  30 mL Oral Q4H PRN ,  T, MD      . asenapine (SAPHRIS) sublingual tablet 10 mg  10 mg Sublingual BID , Jackquline Denmark, MD   10 mg at 02/13/19 1732  . magnesium hydroxide (MILK OF MAGNESIA) suspension 30 mL  30 mL Oral Daily PRN ,  T, MD      . ondansetron (ZOFRAN) injection 4 mg  4 mg Intravenous Once PRN Yves Dill, MD      . pantoprazole (PROTONIX) EC tablet 40 mg  40 mg Oral Daily , Jackquline Denmark, MD   40 mg at 02/13/19 0830  . polyethylene glycol (MIRALAX / GLYCOLAX)  packet 17 g  17 g Oral Daily ,  T, MD   17 g at 02/13/19 0830  . traZODone (DESYREL) tablet 100 mg  100 mg Oral QHS , Jackquline Denmark, MD   100 mg at 02/12/19 2122  . vitamin B-12 (CYANOCOBALAMIN) tablet 1,000 mcg  1,000 mcg Oral Daily , Jackquline Denmark, MD   1,000 mcg at 02/13/19 0830    Lab Results:  Results for orders placed or performed during the hospital encounter of 01/29/19 (from the past 48 hour(s))  Glucose, capillary     Status: None   Collection Time: 02/12/19  6:30 AM  Result Value Ref Range   Glucose-Capillary 92 70 - 99 mg/dL    Blood Alcohol level:  Lab Results  Component Value Date   ETH <10 01/24/2019    Metabolic Disorder Labs: Lab Results  Component Value Date   HGBA1C 4.9 01/30/2019   MPG 93.93 01/30/2019   No results found for: PROLACTIN No results found for: CHOL, TRIG, HDL, CHOLHDL, VLDL, LDLCALC  Physical Findings: AIMS: Facial and Oral Movements Muscles of Facial Expression: None, normal Lips and Perioral Area: None, normal Jaw: None, normal Tongue: None, normal,Extremity Movements Upper (arms, wrists, hands, fingers): None, normal Lower (legs, knees, ankles, toes): None, normal, Trunk Movements Neck, shoulders, hips: None, normal, Overall Severity Severity of abnormal movements (highest score from questions above): None, normal Incapacitation due to abnormal movements: None, normal Patient's awareness of abnormal movements (rate only patient's report): No Awareness, Dental Status Current problems with teeth and/or dentures?: No Does patient usually wear dentures?: No  CIWA:    COWS:     Musculoskeletal: Strength & Muscle Tone: within normal limits Gait & Station: normal Patient leans: N/A  Psychiatric Specialty Exam: Physical Exam  Nursing note and vitals reviewed. Constitutional: He appears well-developed and well-nourished.  HENT:  Head: Normocephalic and atraumatic.  Eyes: Pupils are equal, round, and reactive to light.  Conjunctivae are normal.  Neck: Normal range of motion.  Cardiovascular: Regular rhythm and normal heart sounds.  Respiratory: Effort normal. No respiratory distress.  GI: Soft.  Musculoskeletal: Normal range of motion.  Neurological: He is alert.  Skin: Skin is warm and dry.  Psychiatric: His affect is blunt. His speech is delayed. He is slowed. Cognition and memory are impaired. He expresses inappropriate judgment. He expresses no suicidal ideation.    Review of Systems  Constitutional: Negative.   HENT: Negative.   Eyes: Negative.   Respiratory: Negative.   Cardiovascular: Negative.   Gastrointestinal: Negative.   Musculoskeletal: Negative.  Skin: Negative.   Neurological: Negative.   Psychiatric/Behavioral: Negative.     Blood pressure 112/72, pulse 70, temperature 98.6 F (37 C), temperature source Oral, resp. rate 18, height 5\' 5"  (1.651 m), weight 63.5 kg, SpO2 99 %.Body mass index is 23.3 kg/m.  General Appearance: Casual  Eye Contact:  Fair  Speech:  Slow  Volume:  Decreased  Mood:  Euthymic  Affect:  Flat  Thought Process:  NA  Orientation:  Negative  Thought Content:  Negative  Suicidal Thoughts:  No  Homicidal Thoughts:  No  Memory:  Immediate;   Fair Recent;   Poor Remote;   Poor  Judgement:  Impaired  Insight:  Shallow  Psychomotor Activity:  Decreased  Concentration:  Concentration: Poor  Recall:  Poor  Fund of Knowledge:  Poor  Language:  Poor  Akathisia:  Negative  Handed:  Right  AIMS (if indicated):     Assets:  Desire for Improvement  ADL's:  Impaired  Cognition:  Impaired,  Moderate  Sleep:  Number of Hours: 7.75     Treatment Plan Summary: Medication management and Plan Patient will have ECT again tomorrow.  Reviewed plan with patient.  No clear indication to change medicine at this point.  I told him I really would like to see him getting up out of bed getting more active on the unit.  I had been hopeful that if he became more  physically active we could potentially discharge him home with follow-up outpatient care in the care of his family.  Treatment team will discuss that option tomorrow.  Mordecai RasmussenJohn , MD 02/13/2019, 5:36 PM

## 2019-02-13 NOTE — Progress Notes (Signed)
4 PM -7 PM 1:1 Continuous Monitoring   1615Patient out of bed ambulated to dayroom . Gait remained unsteady. Affect cheerful on approach noted to smile 1700 noted to interact with peers Compliant  With medication  Waiting in dayroom for dinner  Slow  With processing information  1800 Watching TV  With peers for short period . Noted confusion with staying or leaving  Dayroom , undecided, Impulsive

## 2019-02-13 NOTE — Progress Notes (Signed)
12-3 pm 1:1 Monitoring Continuous  1150 AM Patient  Ambulating to dayroom  For lunch  Continue to wear gait belt    Patient ate 100% of his meal at lunch  1 pm  Patient walked up and down halls  For exercise  Gait remains wobbly Noted ton smile when in conversation with staff  3 pm  Patient ambulated  To his room  Got back into the bed , moving frequently  In bed

## 2019-02-13 NOTE — Progress Notes (Signed)
1:1 Monitoring Continuous 7-11  1:1 Continous Monitoring  Patient out of bed ambulated to dayroom for breakfast  Appetite good . Patient ambulated back to room    Compliant with medication during med pass  Patient moving about in bed  Freely  Moving all extremities  And  Turning self . Patient is not wearing a diaper . Patient just wearing  underwear

## 2019-02-13 NOTE — Progress Notes (Signed)
Patient remains on a 1:1 for safety. Patient is awake, alert in bed and is without complaint at this time. A safety sitter is present and the patient remains safe on the unit

## 2019-02-13 NOTE — [facility] Group Notes (Signed)
LCSW Group Therapy Note  02/13/2019 1:00 PM  Type of Therapy/Topic:  Group Therapy:  Feelings about Diagnosis  Participation Level:  Did Not Attend   Description of Group:   This group will allow patients to explore their thoughts and feelings about diagnoses they have received. Patients will be guided to explore their level of understanding and acceptance of these diagnoses. Facilitator will encourage patients to process their thoughts and feelings about the reactions of others to their diagnosis and will guide patients in identifying ways to discuss their diagnosis with significant others in their lives. This group will be process-oriented, with patients participating in exploration of their own experiences, giving and receiving support, and processing challenge from other group members.   Therapeutic Goals: 1. Patient will demonstrate understanding of diagnosis as evidenced by identifying two or more symptoms of the disorder 2. Patient will be able to express two feelings regarding the diagnosis 3. Patient will demonstrate their ability to communicate their needs through discussion and/or role play  Summary of Patient Progress: X  Therapeutic Modalities:   Cognitive Behavioral Therapy Brief Therapy Feelings Identification   Penni Homans, MSW, LCSW 02/13/2019 2:28 PM

## 2019-02-14 ENCOUNTER — Ambulatory Visit (HOSPITAL_COMMUNITY): Payer: Medicare Other | Admitting: Psychiatry

## 2019-02-14 ENCOUNTER — Inpatient Hospital Stay: Payer: Medicare Other | Admitting: Anesthesiology

## 2019-02-14 ENCOUNTER — Other Ambulatory Visit
Admit: 2019-02-14 | Discharge: 2019-02-14 | Disposition: A | Discharge status: Home / Self Care | Payer: Medicare Other | Attending: Psychiatry | Admitting: Psychiatry

## 2019-02-14 ENCOUNTER — Inpatient Hospital Stay: Admit: 2019-02-14 | Discharge: 2019-02-14 | Disposition: A | Discharge status: Home / Self Care | Payer: Medicare Other

## 2019-02-14 MED ORDER — SUCCINYLCHOLINE CHLORIDE 20 MG/ML IJ SOLN
INTRAMUSCULAR | Status: DC | PRN
  Administered 2019-02-14: 90 mg via INTRAVENOUS

## 2019-02-14 MED ORDER — KETAMINE HCL 10 MG/ML IJ SOLN
INTRAMUSCULAR | Status: DC | PRN
  Administered 2019-02-14: 80 mg via INTRAVENOUS

## 2019-02-14 MED ORDER — KETAMINE HCL 50 MG/ML IJ SOLN
INTRAMUSCULAR | Status: AC
  Filled 2019-02-14: qty 10

## 2019-02-14 NOTE — Anesthesia Preprocedure Evaluation (Signed)
Anesthesia Evaluation  Patient identified by MRN, date of birth, ID band Patient awake  General Assessment Comment:Review limited to chart review, limited verbal communication  Reviewed: Allergy & Precautions, NPO status , Patient's Chart, lab work & pertinent test results  History of Anesthesia Complications Negative for: history of anesthetic complications  Airway Mallampati: II  TM Distance: >3 FB Neck ROM: Full    Dental  (+) Poor Dentition   Pulmonary neg pulmonary ROS, neg sleep apnea, neg COPD,    breath sounds clear to auscultation- rhonchi (-) wheezing      Cardiovascular (-) hypertension(-) CAD, (-) Past MI, (-) Cardiac Stents and (-) CABG  Rhythm:Regular Rate:Normal - Systolic murmurs and - Diastolic murmurs    Neuro/Psych neg Seizures PSYCHIATRIC DISORDERS Depression Bipolar Disorder Schizophrenia negative neurological ROS     GI/Hepatic Neg liver ROS, GERD  ,  Endo/Other  negative endocrine ROSneg diabetes  Renal/GU negative Renal ROS     Musculoskeletal negative musculoskeletal ROS (+)   Abdominal (+) - obese,   Peds  Hematology negative hematology ROS (+)   Anesthesia Other Findings Past Medical History: No date: Bipolar 1 disorder (HCC) No date: 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HCC) No date: GERD (gastroesophageal reflux disease) No date: Nonverbal No date: Schizoaffective disorder (HCC) No date: Tardive dyskinesia   Reproductive/Obstetrics                             Anesthesia Physical  Anesthesia Plan  ASA: II  Anesthesia Plan: General   Post-op Pain Management:    Induction: Intravenous  PONV Risk Score and Plan: 1 and Ondansetron  Airway Management Planned: Mask  Additional Equipment:   Intra-op Plan:   Post-operative Plan:   Informed Consent: I have reviewed the patients History and Physical, chart, labs and discussed the procedure including the risks,  benefits and alternatives for the proposed anesthesia with the patient or authorized representative who has indicated his/her understanding and acceptance.     Dental advisory given  Plan Discussed with: CRNA and Anesthesiologist  Anesthesia Plan Comments:         Anesthesia Quick Evaluation

## 2019-02-14 NOTE — Transfer of Care (Signed)
Immediate Anesthesia Transfer of Care Note  Patient: Troy Fox  Procedure(s) Performed: ECT TX  Patient Location: PACU  Anesthesia Type:General  Level of Consciousness: drowsy and patient cooperative  Airway & Oxygen Therapy: Patient Spontanous Breathing and Patient connected to face mask oxygen  Post-op Assessment: Report given to RN and Post -op Vital signs reviewed and stable  Post vital signs: Reviewed and stable  Last Vitals:  Vitals Value Taken Time  BP 131/88 02/14/2019 10:27 AM  Temp    Pulse    Resp 18 02/14/2019 10:27 AM  SpO2 98 % 02/14/2019 10:27 AM    Last Pain:  Vitals:   02/14/19 0931  TempSrc:   PainSc: 0-No pain         Complications: No apparent anesthesia complications

## 2019-02-14 NOTE — Progress Notes (Signed)
Sitter at bedside, patient alert in bed with eyes closed no distress noted, symmetrical chest expansion, 15 minutes safety checks maintained will continue to monitor

## 2019-02-14 NOTE — Procedures (Signed)
ECT SERVICES Physician's Interval Evaluation & Treatment Note  Patient Identification: Troy Fox MRN:  [id number] Date of Evaluation:  02/14/2019 TX #: 6  MADRS:   MMSE:   P.E. Findings:  Patient is showing more strength and agility.  Psychiatric Interval Note:  More awake and alert able to answer some questions.  Subjective:  Patient is a 48 y.o. male seen for evaluation for Electroconvulsive Therapy. No particular complaint  Treatment Summary:   []   Right Unilateral             [x]  Bilateral   % Energy : 1.0 ms 100%   Impedance: 1690 ohms  Seizure Energy Index: 17,847 V squared  Postictal Suppression Index: 83%  Seizure Concordance Index: 95%  Medications  Pre Shock: Ketamine 80 mg succinylcholine 90 mg  Post Shock:    Seizure Duration: 22 seconds EMG 27 seconds EEG   Comments: Patient continues to improve.  As noted above I will reassess his medication treatment.  We are planning to get in touch with his family today to discuss possible discharge planning.  Lungs:  [x]   Clear to auscultation               []  Other:   Heart:    [x]   Regular rhythm             []  irregular rhythm    [x]   Previous H&P reviewed, patient examined and there are NO CHANGES                 []   Previous H&P reviewed, patient examined and there are changes noted.   Mordecai Rasmussen, MD 6/3/20204:40 PM

## 2019-02-14 NOTE — Tx Team (Signed)
Interdisciplinary Treatment and Diagnostic Plan Update  02/14/2019 Time of Session: 830am Troy Fox MRN: [id number]  Principal Diagnosis: Schizoaffective disorder, depressive type [facility])  Secondary Diagnoses: Principal Problem:   Schizoaffective disorder, depressive type ([place]) Active Problems:   Catatonia   Mild malnutrition ([place])   Current Medications:  Current Facility-Administered Medications  Medication Dose Route Frequency Provider Last Rate Last Dose  . acetaminophen (TYLENOL) tablet 650 mg  650 mg Oral Q6H PRN Clapacs, John T, MD      . alum & mag hydroxide-simeth (MAALOX/MYLANTA) 200-200-20 MG/5ML suspension 30 mL  30 mL Oral Q4H PRN Clapacs, John T, MD      . asenapine (SAPHRIS) sublingual tablet 10 mg  10 mg Sublingual BID Clapacs, Madie Reno, MD   10 mg at 02/13/19 1732  . magnesium hydroxide (MILK OF MAGNESIA) suspension 30 mL  30 mL Oral Daily PRN Clapacs, John T, MD      . ondansetron (ZOFRAN) injection 4 mg  4 mg Intravenous Once PRN Alvin Critchley, MD      . pantoprazole (PROTONIX) EC tablet 40 mg  40 mg Oral Daily Clapacs, Madie Reno, MD   40 mg at 02/13/19 0830  . polyethylene glycol (MIRALAX / GLYCOLAX) packet 17 g  17 g Oral Daily Clapacs, John T, MD   17 g at 02/13/19 0830  . traZODone (DESYREL) tablet 100 mg  100 mg Oral QHS Clapacs, John T, MD   100 mg at 02/13/19 2222  . vitamin B-12 (CYANOCOBALAMIN) tablet 1,000 mcg  1,000 mcg Oral Daily Clapacs, Madie Reno, MD   1,000 mcg at 02/13/19 0830   PTA Medications: Medications Prior to Admission  Medication Sig Dispense Refill Last Dose  . atenolol (TENORMIN) 25 MG tablet Take 1 tablet (25 mg total) by mouth daily. 90 tablet 0 02/11/2019  . busPIRone (BUSPAR) 5 MG tablet Take 1 tablet (5 mg total) by mouth 2 (two) times daily. (Patient taking differently: Take 5 mg by mouth 2 (two) times daily as needed. ) 60 tablet 0 02/11/2019  . Cyanocobalamin (VITAMIN B 12) 500 MCG TABS Take 500 mcg by mouth daily. 90 tablet 0 02/11/2019  .  LORazepam (ATIVAN) 0.5 MG tablet Take 1 tablet (0.5 mg total) by mouth every 8 (eight) hours as needed for anxiety. 90 tablet 2 02/11/2019  . omeprazole (PRILOSEC) 40 MG capsule Take 1 capsule (40 mg total) by mouth daily. 90 capsule 0 02/11/2019  . paliperidone (INVEGA) 6 MG 24 hr tablet Take 1 tablet (6 mg total) by mouth daily. 90 tablet 0 02/11/2019  . polyethylene glycol (MIRALAX) 17 g packet Take 17 g by mouth daily as needed for moderate constipation. 72 packet 0 02/11/2019  . sertraline (ZOLOFT) 50 MG tablet Take 3 tablets (150 mg total) by mouth daily. 270 tablet 0 02/11/2019  . traZODone (DESYREL) 50 MG tablet Take 1 tablet (50 mg total) by mouth at bedtime. 90 tablet 0 02/11/2019  . Valbenazine Tosylate (INGREZZA) 40 & 80 MG CPPK Take 40 mg by mouth daily for 7 days, THEN 80 mg daily for 30 days. 30 each 0 02/11/2019  . benztropine (COGENTIN) 1 MG tablet Take 1 tablet (1 mg total) by mouth 2 (two) times daily for 7 days. 14 tablet 0 01/23/2019 at 1730    Patient Stressors: Financial difficulties Health problems  Patient Strengths: Motivation for treatment/growth Supportive family/friends  Treatment Modalities: Medication Management, Group therapy, Case management,  1 to 1 session with clinician, Psychoeducation, Recreational therapy.   Physician Treatment  Plan for Primary Diagnosis: Schizoaffective disorder, depressive type ([place]) Long Term Goal(s): Improvement in symptoms so as ready for discharge Improvement in symptoms so as ready for discharge   Short Term Goals: Ability to verbalize feelings will improve Compliance with prescribed medications will improve Ability to maintain clinical measurements within normal limits will improve  Medication Management: Evaluate patient's response, side effects, and tolerance of medication regimen.  Therapeutic Interventions: 1 to 1 sessions, Unit Group sessions and Medication administration.  Evaluation of Outcomes: Not Met  Physician  Treatment Plan for Secondary Diagnosis: Principal Problem:   Schizoaffective disorder, depressive type ([place]) Active Problems:   Catatonia   Mild malnutrition ([place])  Long Term Goal(s): Improvement in symptoms so as ready for discharge Improvement in symptoms so as ready for discharge   Short Term Goals: Ability to verbalize feelings will improve Compliance with prescribed medications will improve Ability to maintain clinical measurements within normal limits will improve     Medication Management: Evaluate patient's response, side effects, and tolerance of medication regimen.  Therapeutic Interventions: 1 to 1 sessions, Unit Group sessions and Medication administration.  Evaluation of Outcomes: Not Met   RN Treatment Plan for Primary Diagnosis: Schizoaffective disorder, depressive type ([place]) Long Term Goal(s): Knowledge of disease and therapeutic regimen to maintain health will improve  Short Term Goals: Ability to identify and develop effective coping behaviors will improve and Compliance with prescribed medications will improve  Medication Management: RN will administer medications as ordered by provider, will assess and evaluate patient's response and provide education to patient for prescribed medication. RN will report any adverse and/or side effects to prescribing provider.  Therapeutic Interventions: 1 on 1 counseling sessions, Psychoeducation, Medication administration, Evaluate responses to treatment, Monitor vital signs and CBGs as ordered, Perform/monitor CIWA, COWS, AIMS and Fall Risk screenings as ordered, Perform wound care treatments as ordered.  Evaluation of Outcomes: Not Met   LCSW Treatment Plan for Primary Diagnosis: Schizoaffective disorder, depressive type ([place]) Long Term Goal(s): Safe transition to appropriate next level of care at discharge, Engage patient in therapeutic group addressing interpersonal concerns.  Short Term Goals: Engage patient in aftercare  planning with referrals and resources  Therapeutic Interventions: Assess for all discharge needs, 1 to 1 time with Social worker, Explore available resources and support systems, Assess for adequacy in community support network, Educate family and significant other(s) on suicide prevention, Complete Psychosocial Assessment, Interpersonal group therapy.  Evaluation of Outcomes: Not Met   Progress in Treatment: Attending groups: No. Participating in groups: No. Taking medication as prescribed: Yes. Toleration medication: Yes. Family/Significant other contact made: No, will contact:  when pt gives consent Patient understands diagnosis: No. Discussing patient identified problems/goals with staff: No. Medical problems stabilized or resolved: No. Denies suicidal/homicidal ideation: Yes Issues/concerns per patient self-inventory: No. Other: N/A  New problem(s) identified: No, Describe:  None reported  New Short Term/Long Term Goal(s):Pt requires assistance with walking and experiencing minimal verbal communication.   Patient Goals:  Pt unable to provide a goal at this time.  Discharge Plan or Barriers: Pt will resume outpatient ECT. 02/07/19-No change in progress. Will receive ECT today. 02/14/19-Pt will receive ECT today. Pt able to give one word responses to some questions, unable to sign  documentation to schedule follow up care at this time. Dr. Weber Cooks reports he will contact the pt's parents to see if he can return home in his current state and resume outpatient ECT. D/C plan TBD.  Reason for Continuation of Hospitalization: Medication stabilization, ECT  Estimated Length of Stay: TBD  Recreational Therapy: Patient Stressors: N/A  Patient Goal: Patient will engage in groups without prompting or encouragement from LRT x3 group sessions within 5 recreation therapy group sessions  Attendees: Patient: 02/14/2019 10:34 AM  Physician: Alethia Berthold 02/14/2019 10:34 AM  Nursing: Polly Cobia  02/14/2019 10:34 AM  RN Care Manager: 02/14/2019 10:34 AM  Social Worker: Sanjuana Kava [facility] 02/14/2019 10:34 AM  Recreational Therapist:  02/14/2019 10:34 AM  Other:  02/14/2019 10:34 AM  Other:  02/14/2019 10:34 AM  Other: 02/14/2019 10:34 AM    Scribe for Treatment Team: Yvette Rack, LCSW 02/14/2019 10:34 AM

## 2019-02-14 NOTE — Progress Notes (Signed)
[facility] MD Progress Note  02/14/2019 4:36 PM Troy Fox  MRN:  [id number] Subjective: Patient had ECT again.  Patient's ECT treatment was bilateral and had no complications.  He continues to show clear improvement.  He is able to speak and respond to questions appropriately.  He is able to get up out of bed and able to feed himself.  His responses are still very slow and with only a few words at a time.  He has not shown any attempts to harm himself or anyone else. Principal Problem: Schizoaffective disorder, depressive type (HCC) Diagnosis: Principal Problem:   Schizoaffective disorder, depressive type (HCC) Active Problems:   Catatonia   Mild malnutrition (HCC)  Total Time spent with patient: 30 minutes  Past Psychiatric History: Long history of schizoaffective disorder recent catatonia  Past Medical History:  Past Medical History:  Diagnosis Date  . Bipolar 1 disorder (HCC)   . 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 GERD (gastroesophageal reflux disease)   . Nonverbal   . Schizoaffective disorder (HCC)   . Tardive dyskinesia    History reviewed. No pertinent surgical history. Family History:  Family History  Adopted: Yes  Family history unknown: Yes   Family Psychiatric  History: See previous Social History:  Social History   Substance and Sexual Activity  Alcohol Use Never  . Frequency: Never     Social History   Substance and Sexual Activity  Drug Use Never    Social History   Socioeconomic History  . Marital status: Single    Spouse name: Not on file  . Number of children: Not on file  . Years of education: Not on file  . Highest education level: Not on file  Occupational History  . Not on file  Social Needs  . Financial resource strain: Not on file  . Food insecurity:    Worry: Not on file    Inability: Not on file  . Transportation needs:    Medical: Not on file    Non-medical: Not on file  Tobacco Use  . Smoking status: Never Smoker  . Smokeless  tobacco: Never Used  Substance and Sexual Activity  . Alcohol use: Never    Frequency: Never  . Drug use: Never  . Sexual activity: Not Currently  Lifestyle  . Physical activity:    Days per week: Not on file    Minutes per session: Not on file  . Stress: Not on file  Relationships  . Social connections:    Talks on phone: Not on file    Gets together: Not on file    Attends religious service: Not on file    Active member of club or organization: Not on file    Attends meetings of clubs or organizations: Not on file    Relationship status: Not on file  Other Topics Concern  . Not on file  Social History Narrative  . Not on file   Additional Social History:    Pain Medications: please see mar Prescriptions: please see mar Over the Counter: please see mar History of alcohol / drug use?: No history of alcohol / drug abuse Longest period of sobriety (when/how long): NA                    Sleep: Fair  Appetite:  Fair  Current Medications: Current Facility-Administered Medications  Medication Dose Route Frequency Provider Last Rate Last Dose  . acetaminophen (TYLENOL) tablet 650 mg  650 mg Oral Q6H PRN Clapacs,  Jackquline Denmark, MD      . alum & mag hydroxide-simeth (MAALOX/MYLANTA) 200-200-20 MG/5ML suspension 30 mL  30 mL Oral Q4H PRN Clapacs, John T, MD      . asenapine (SAPHRIS) sublingual tablet 10 mg  10 mg Sublingual BID Clapacs, Jackquline Denmark, MD   10 mg at 02/13/19 1732  . magnesium hydroxide (MILK OF MAGNESIA) suspension 30 mL  30 mL Oral Daily PRN Clapacs, John T, MD      . ondansetron (ZOFRAN) injection 4 mg  4 mg Intravenous Once PRN Yves Dill, MD      . pantoprazole (PROTONIX) EC tablet 40 mg  40 mg Oral Daily Clapacs, Jackquline Denmark, MD   40 mg at 02/13/19 0830  . polyethylene glycol (MIRALAX / GLYCOLAX) packet 17 g  17 g Oral Daily Clapacs, John T, MD   17 g at 02/13/19 0830  . traZODone (DESYREL) tablet 100 mg  100 mg Oral QHS Clapacs, John T, MD   100 mg at 02/13/19 2222   . vitamin B-12 (CYANOCOBALAMIN) tablet 1,000 mcg  1,000 mcg Oral Daily Clapacs, Jackquline Denmark, MD   1,000 mcg at 02/13/19 0830    Lab Results: No results found for this or any previous visit (from the past 48 hour(s)).  Blood Alcohol level:  Lab Results  Component Value Date   ETH <10 01/24/2019    Metabolic Disorder Labs: Lab Results  Component Value Date   HGBA1C 4.9 01/30/2019   MPG 93.93 01/30/2019   No results found for: PROLACTIN No results found for: CHOL, TRIG, HDL, CHOLHDL, VLDL, LDLCALC  Physical Findings: AIMS: Facial and Oral Movements Muscles of Facial Expression: None, normal Lips and Perioral Area: None, normal Jaw: None, normal Tongue: None, normal,Extremity Movements Upper (arms, wrists, hands, fingers): None, normal Lower (legs, knees, ankles, toes): None, normal, Trunk Movements Neck, shoulders, hips: None, normal, Overall Severity Severity of abnormal movements (highest score from questions above): None, normal Incapacitation due to abnormal movements: None, normal Patient's awareness of abnormal movements (rate only patient's report): No Awareness, Dental Status Current problems with teeth and/or dentures?: No Does patient usually wear dentures?: No  CIWA:    COWS:     Musculoskeletal: Strength & Muscle Tone: within normal limits Gait & Station: unsteady Patient leans: N/A  Psychiatric Specialty Exam: Physical Exam  Nursing note and vitals reviewed. Constitutional: He appears well-developed and well-nourished.  HENT:  Head: Normocephalic and atraumatic.  Eyes: Pupils are equal, round, and reactive to light. Conjunctivae are normal.  Neck: Normal range of motion.  Cardiovascular: Regular rhythm and normal heart sounds.  Respiratory: Effort normal. No respiratory distress.  GI: Soft.  Musculoskeletal: Normal range of motion.  Neurological: He is alert.  Skin: Skin is warm and dry.  Psychiatric: Judgment normal. His affect is blunt. His speech is  delayed. He is slowed and withdrawn. He expresses no homicidal and no suicidal ideation. He exhibits abnormal recent memory.    Review of Systems  Constitutional: Negative.   HENT: Negative.   Eyes: Negative.   Respiratory: Negative.   Cardiovascular: Negative.   Gastrointestinal: Negative.   Musculoskeletal: Negative.   Skin: Negative.   Neurological: Negative.   Psychiatric/Behavioral: Negative for depression and suicidal ideas.    Blood pressure 128/69, pulse 73, temperature 98.7 F (37.1 C), temperature source Oral, resp. rate 15, height  (1.651 m), weight 63.5 kg, SpO2 99 %.Body mass index is 23.3 kg/m.  General Appearance: Casual  Eye Contact:  Fair  Speech:  Slow  Volume:  Decreased  Mood:  Euthymic  Affect:  Constricted  Thought Process:  Coherent  Orientation:  Other:  He knew he was at a hospital.  [place] when I corrected him he remembered.  Thought Content:  Rumination and Tangential  Suicidal Thoughts:  No  Homicidal Thoughts:  No  Memory:  Immediate;   Fair Recent;   Poor Remote;   Poor  Judgement:  Impaired  Insight:  Shallow  Psychomotor Activity:  Decreased  Concentration:  Concentration: Fair  Recall:  [employer]Fair  Fund of Knowledge:  Fair  Language:  Poor  Akathisia:  No  Handed:  Right  AIMS (if indicated):     Assets:  Desire for Improvement Housing Social Support  ADL's:  Impaired  Cognition:  Impaired,  Moderate and Severe  Sleep:  Number of Hours: 3.5     Treatment Plan Summary: Daily contact with patient to assess and evaluate symptoms and progress in treatment, Medication management and Plan Continues to show improvement although has sort of plateaued the last couple treatments.  At this point he is better able to take medication so I am going to review whether the Saphris is the right medicine or other may be a better combination.  We still will continue ECT treatment for now.  I am going to get in touch with  his parents because we may be able to get him discharged from the hospital within the next couple days if he continues to show some improvement.  Mordecai RasmussenJohn Clapacs, MD 02/14/2019, 4:36 PM

## 2019-02-14 NOTE — Progress Notes (Signed)
12 PM- 3 PM Continuous 1:1 Monitoring   12 PM Patient returned from ECt Treatment  Eating lunch in Dayroom  12:30 Patient in room sleeping  , able to move  All extremities  1 PM Patient remains in bed sleeping  Able to move all extremities  2 PM Patient remains in bed sleeping  Able to move all extremities  3 pm Patient remains in bed sleeping  Able to move all extremities

## 2019-02-14 NOTE — Progress Notes (Signed)
4 PM - 7 PM Continuous  Monitoring 1:1  4 PM Patient remains in bed sleeping  Able to move all extremities  Ambulating to day room  Appetite good at dinner  5  PM Patient went outside briefly  returned walked the halls of the unit  With CNA  For 40 minutes  6 PM  Sitting  Out side nursing station in chair . Limited verbal  Conversation . Patient noted to smile more  When  In conversation  With patient

## 2019-02-14 NOTE — Progress Notes (Signed)
7 AM -11 AM Continuous Monitoring   7 AM patient lying in bed moving about freely  8 am Patient remained in bed  NPO  9 Patient  Escorted to ECT treatment area

## 2019-02-14 NOTE — Anesthesia Postprocedure Evaluation (Signed)
Anesthesia Post Note  Patient: Troy Fox  Procedure(s) Performed: ECT TX  Patient location during evaluation: PACU Anesthesia Type: General Level of consciousness: awake and alert Pain management: pain level controlled Vital Signs Assessment: post-procedure vital signs reviewed and stable Respiratory status: spontaneous breathing, nonlabored ventilation and respiratory function stable Cardiovascular status: blood pressure returned to baseline and stable Postop Assessment: no signs of nausea or vomiting Anesthetic complications: no     Last Vitals:  Vitals:   02/14/19 1045 02/14/19 1053  BP: (!) 113/100 109/74  Pulse: (!) 106 (!) 101  Resp: (!) 22 14  Temp:    SpO2: 93% 96%    Last Pain:  Vitals:   02/14/19 1053  TempSrc:   PainSc: 0-No pain                 Tye Juarez

## 2019-02-14 NOTE — [facility] Group Notes (Signed)
LCSW Group Therapy Note  02/14/2019 12:27 PM  Type of Therapy/Topic:  Group Therapy:  Emotion Regulation  Participation Level:  Did Not Attend   Description of Group:   The purpose of this group is to assist patients in learning to regulate negative emotions and experience positive emotions. Patients will be guided to discuss ways in which they have been vulnerable to their negative emotions. These vulnerabilities will be juxtaposed with experiences of positive emotions or situations, and patients will be challenged to use positive emotions to combat negative ones. Special emphasis will be placed on coping with negative emotions in conflict situations, and patients will process healthy conflict resolution skills.  Therapeutic Goals: 1. Patient will identify two positive emotions or experiences to reflect on in order to balance out negative emotions 2. Patient will label two or more emotions that they find the most difficult to experience 3. Patient will demonstrate positive conflict resolution skills through discussion and/or role plays  Summary of Patient Progress: x   Therapeutic Modalities:   Cognitive Behavioral Therapy Feelings Identification Dialectical Behavioral Therapy   Urho Rio, MSW, LCSW Clinical Social Work 02/14/2019 12:27 PM    

## 2019-02-14 NOTE — Progress Notes (Signed)
Recreation Therapy Notes  Date: 02/14/2019  Time: 9:30 am   Location: Craft room   Behavioral response: N/A   Intervention Topic: Wellness  Discussion/Intervention: Patient did not attend group.   Clinical Observations/Feedback:  Patient did not attend group.   Fanta Wimberley LRT/CTRS        Jazzy Parmer 02/14/2019 11:00 AM

## 2019-02-14 NOTE — Anesthesia Post-op Follow-up Note (Signed)
Anesthesia QCDR form completed.        

## 2019-02-14 NOTE — Progress Notes (Signed)
Patient alert and oriented x 4, affect is flat but he brightens upon approach, no distress noted , he appears calm , less irritable no aggressive behavior towards staff. Patient was offered support and encouragement, sitter at bedside will continue to monitor closely .

## 2019-02-14 NOTE — H&P (Signed)
Troy Fox is an 48 y.o. male.   Chief Complaint: catatonia HPI: schizoaffective disorder  Past Medical History:  Diagnosis Date  . Bipolar 1 disorder (HCC)   . 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 GERD (gastroesophageal reflux disease)   . Nonverbal   . Schizoaffective disorder (HCC)   . Tardive dyskinesia     History reviewed. No pertinent surgical history.  Family History  Adopted: Yes  Family history unknown: Yes   Social History:  reports that he has never smoked. He has never used smokeless tobacco. He reports that he does not drink alcohol or use drugs.  Allergies: No Known Allergies  (Not in a hospital admission)   No results found for this or any previous visit (from the past 48 hour(s)). No results found.  Review of Systems  Constitutional: Negative.   HENT: Negative.   Eyes: Negative.   Respiratory: Negative.   Cardiovascular: Negative.   Gastrointestinal: Negative.   Musculoskeletal: Negative.   Skin: Negative.   Neurological: Negative.   Psychiatric/Behavioral: Negative.     Blood pressure 122/79, pulse 71, temperature 97.9 F (36.6 C), temperature source Oral, resp. rate 18, SpO2 100 %. Physical Exam  Nursing note and vitals reviewed. Constitutional: He appears well-developed and well-nourished.  HENT:  Head: Normocephalic and atraumatic.  Eyes: Pupils are equal, round, and reactive to light. Conjunctivae are normal.  Neck: Normal range of motion.  Cardiovascular: Normal heart sounds.  Respiratory: Effort normal.  GI: Soft.  Musculoskeletal: Normal range of motion.  Neurological: He is alert.  Skin: Skin is warm and dry.  Psychiatric: His affect is blunt. His speech is delayed. He is slowed. Cognition and memory are impaired. He expresses impulsivity. He expresses no homicidal and no suicidal ideation.     Assessment/Plan Continue index ect  Mordecai Rasmussen, MD 02/14/2019, 9:57 AM

## 2019-02-14 NOTE — Progress Notes (Signed)
Patient woke up distributing eye jerking movement and quivering speech. I alerted Patrice and she observed the movements. We asked Dr. Toni Amend to come over to assess him and he was okay with the movements and stated it could be the Ketamine and undelying other conditions. Patient is okay to continue to monitor and send back to the floor once he is alert, oriented and ready.

## 2019-02-15 ENCOUNTER — Other Ambulatory Visit: Payer: Self-pay | Admitting: Psychiatry

## 2019-02-15 NOTE — Progress Notes (Signed)
Patient in bed with eyes closed, respiration non labored, symmetrical chest expansion  no distress noted, sitter at bedside,will continue to monitor.

## 2019-02-15 NOTE — Progress Notes (Signed)
Pt has been resting in his room today,and  ambulating with assistance. Pt has stated that he has not needed anything when I check on him. [name] RN

## 2019-02-15 NOTE — Progress Notes (Signed)
Patient alert and oriented in bed with eyes closed, respiration even non labored, symmetrical chest expansion, sitter at bedside, no distress noted 15 minutes safety checks maintained will continue to monitor.

## 2019-02-15 NOTE — Progress Notes (Signed)
Patient alert and oriented x 3, with periods of confusion to situation denies SI/HI/AVH, sitter at bedside, resting in bed will continue to monitor.

## 2019-02-15 NOTE — Progress Notes (Signed)
[facility] MD Progress Note  02/15/2019 1:22 PM Troy Fox  MRN:  [id number] Subjective: Patient seen and chart reviewed.  I also spoke with the patient's father this afternoon.  Patient is different than he was yesterday.  Yesterday afternoon he was walking around and able to have conversations.  Today when I came to see him he was in bed barely made eye contact only spoke a couple of words.  Not willing to engage in any conversation.  No specific reason that I know of why things are back-and-forth like this.  Father reports that the patient has of course been nonfunctional at that hospital level pretty much since last August but did have a long period of good functioning and stability prior to that.  We talked about ultimate discharge options and what will be possible outside of the hospital.  Father knew only a little bit about medications the patient had taken in the past. Principal Problem: Schizoaffective disorder, depressive type (HCC) Diagnosis: Principal Problem:   Schizoaffective disorder, depressive type (HCC) Active Problems:   Catatonia   Mild malnutrition (HCC)  Total Time spent with patient: 30 minutes  Past Psychiatric History: Long history of schizophrenia with a more recent period of disability and catatonia  Past Medical History:  Past Medical History:  Diagnosis Date  . Bipolar 1 disorder (HCC)   . 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 GERD (gastroesophageal reflux disease)   . Nonverbal   . Schizoaffective disorder (HCC)   . Tardive dyskinesia    History reviewed. No pertinent surgical history. Family History:  Family History  Adopted: Yes  Family history unknown: Yes   Family Psychiatric  History: None known Social History:  Social History   Substance and Sexual Activity  Alcohol Use Never  . Frequency: Never     Social History   Substance and Sexual Activity  Drug Use Never    Social History   Socioeconomic History  . Marital status: Single    Spouse name:  Not on file  . Number of children: Not on file  . Years of education: Not on file  . Highest education level: Not on file  Occupational History  . Not on file  Social Needs  . Financial resource strain: Not on file  . Food insecurity:    Worry: Not on file    Inability: Not on file  . Transportation needs:    Medical: Not on file    Non-medical: Not on file  Tobacco Use  . Smoking status: Never Smoker  . Smokeless tobacco: Never Used  Substance and Sexual Activity  . Alcohol use: Never    Frequency: Never  . Drug use: Never  . Sexual activity: Not Currently  Lifestyle  . Physical activity:    Days per week: Not on file    Minutes per session: Not on file  . Stress: Not on file  Relationships  . Social connections:    Talks on phone: Not on file    Gets together: Not on file    Attends religious service: Not on file    Active member of club or organization: Not on file    Attends meetings of clubs or organizations: Not on file    Relationship status: Not on file  Other Topics Concern  . Not on file  Social History Narrative  . Not on file   Additional Social History:    Pain Medications: please see mar Prescriptions: please see mar Over the Counter: please see mar  History of alcohol / drug use?: No history of alcohol / drug abuse Longest period of sobriety (when/how long): NA                    Sleep: Fair  Appetite:  Poor  Current Medications: Current Facility-Administered Medications  Medication Dose Route Frequency Provider Last Rate Last Dose  . acetaminophen (TYLENOL) tablet 650 mg  650 mg Oral Q6H PRN Kasarah Sitts T, MD      . alum & mag hydroxide-simeth (MAALOX/MYLANTA) 200-200-20 MG/5ML suspension 30 mL  30 mL Oral Q4H PRN Hades Mathew T, MD      . asenapine (SAPHRIS) sublingual tablet 10 mg  10 mg Sublingual BID Azuree Minish, Jackquline Denmark, MD   10 mg at 02/15/19 0904  . magnesium hydroxide (MILK OF MAGNESIA) suspension 30 mL  30 mL Oral Daily PRN  Arihant Pennings T, MD      . ondansetron (ZOFRAN) injection 4 mg  4 mg Intravenous Once PRN Yves Dill, MD      . pantoprazole (PROTONIX) EC tablet 40 mg  40 mg Oral Daily Takeesha Isley, Jackquline Denmark, MD   40 mg at 02/15/19 0903  . polyethylene glycol (MIRALAX / GLYCOLAX) packet 17 g  17 g Oral Daily Efton Thomley T, MD   17 g at 02/13/19 0830  . traZODone (DESYREL) tablet 100 mg  100 mg Oral QHS Duan Scharnhorst, Jackquline Denmark, MD   100 mg at 02/14/19 2243  . vitamin B-12 (CYANOCOBALAMIN) tablet 1,000 mcg  1,000 mcg Oral Daily Scotland Korver, Jackquline Denmark, MD   1,000 mcg at 02/15/19 0321    Lab Results: No results found for this or any previous visit (from the past 48 hour(s)).  Blood Alcohol level:  Lab Results  Component Value Date   ETH <10 01/24/2019    Metabolic Disorder Labs: Lab Results  Component Value Date   HGBA1C 4.9 01/30/2019   MPG 93.93 01/30/2019   No results found for: PROLACTIN No results found for: CHOL, TRIG, HDL, CHOLHDL, VLDL, LDLCALC  Physical Findings: AIMS: Facial and Oral Movements Muscles of Facial Expression: None, normal Lips and Perioral Area: None, normal Jaw: None, normal Tongue: None, normal,Extremity Movements Upper (arms, wrists, hands, fingers): None, normal Lower (legs, knees, ankles, toes): None, normal, Trunk Movements Neck, shoulders, hips: None, normal, Overall Severity Severity of abnormal movements (highest score from questions above): None, normal Incapacitation due to abnormal movements: None, normal Patient's awareness of abnormal movements (rate only patient's report): No Awareness, Dental Status Current problems with teeth and/or dentures?: No Does patient usually wear dentures?: No  CIWA:    COWS:     Musculoskeletal: Strength & Muscle Tone: decreased and atrophy Gait & Station: unable to stand Patient leans: N/A  Psychiatric Specialty Exam: Physical Exam  Nursing note and vitals reviewed. Constitutional: He appears well-developed and well-nourished.  HENT:   Head: Normocephalic and atraumatic.  Eyes: Pupils are equal, round, and reactive to light. Conjunctivae are normal.  Neck: Normal range of motion.  Cardiovascular: Regular rhythm and normal heart sounds.  Respiratory: Effort normal.  GI: Soft.  Musculoskeletal: Normal range of motion.  Neurological: He is alert.  Skin: Skin is warm and dry.  Psychiatric: His affect is blunt. He is noncommunicative.    Review of Systems  Unable to perform ROS: Psychiatric disorder    Blood pressure 90/67, pulse 86, temperature 98 F (36.7 C), temperature source Oral, resp. rate 16, height 5\' 5"  (1.651 m), weight 63.5 kg, SpO2 98 %.Body mass  index is 23.3 kg/m.  General Appearance: Casual  Eye Contact:  Minimal  Speech:  Negative  Volume:  Decreased  Mood:  Negative  Affect:  Negative  Thought Process:  NA  Orientation:  Negative  Thought Content:  Negative  Suicidal Thoughts:  No  Homicidal Thoughts:  No  Memory:  Negative  Judgement:  Negative  Insight:  Negative  Psychomotor Activity:  Negative  Concentration:  Concentration: Negative  Recall:  Negative  Fund of Knowledge:  Negative  Language:  Negative  Akathisia:  Negative  Handed:  Right  AIMS (if indicated):     Assets:  Financial Resources/Insurance Physical Health Resilience Social Support  ADL's:  Impaired  Cognition:  Impaired,  Moderate and Severe  Sleep:  Number of Hours: 5.5     Treatment Plan Summary: Daily contact with patient to assess and evaluate symptoms and progress in treatment, Medication management and Plan Overall tolerating ECT.  As I explained to the father sometimes things will get a little bit better and then slide back.  Overall he is definitely still improving but remains intermittently catatonic requiring one-to-one care.  Would be unable to function outside the hospital.  Still profoundly impaired by his illness and not eating consistently.  Plan is to continue ECT meanwhile I wanted to find out from  the father more information about past medication.  Apparently the patient was stable on Invega injections at one point so that would be an option although that was what he was taking when he came into the hospital.  Sounds like we still really do not know what was the best antipsychotic preparation for him.  Mordecai RasmussenJohn Aundray Cartlidge, MD 02/15/2019, 1:22 PM

## 2019-02-15 NOTE — Progress Notes (Signed)
Patient complaint with medication, sitter at bedside no distress noted, thoughts are organized , he verbalized concerns and he eat some snacks . Patient is S/P ECT, writer some agitation from patient. Patient continues to be on 1:1 will continue to monitor .

## 2019-02-15 NOTE — [facility] Group Notes (Signed)
Balance In Life 02/15/2019 1PM  Type of Therapy/Topic:  Group Therapy:  Balance in Life  Participation Level:  Did Not Attend  Description of Group:   This group will address the concept of balance and how it feels and looks when one is unbalanced. Patients will be encouraged to process areas in their lives that are out of balance and identify reasons for remaining unbalanced. Facilitators will guide patients in utilizing problem-solving interventions to address and correct the stressor making their life unbalanced. Understanding and applying boundaries will be explored and addressed for obtaining and maintaining a balanced life. Patients will be encouraged to explore ways to assertively make their unbalanced needs known to significant others in their lives, using other group members and facilitator for support and feedback.  Therapeutic Goals: 1. Patient will identify two or more emotions or situations they have that consume much of in their lives. 2. Patient will identify signs/triggers that life has become out of balance:  3. Patient will identify two ways to set boundaries in order to achieve balance in their lives:  4. Patient will demonstrate ability to communicate their needs through discussion and/or role plays  Summary of Patient Progress:    Therapeutic Modalities:   Cognitive Behavioral Therapy Solution-Focused Therapy Assertiveness Training  Tamberly Pomplun T Ameilia Rattan, LCSW  

## 2019-02-15 NOTE — Progress Notes (Signed)
1:1 Observation Note  Pt. Observed in his room sleeping with 1:1 present for safety. No distress noted at this time. Pt. Respirations even and unlabored. Will continue to monitor 1:1 for safety.

## 2019-02-15 NOTE — Plan of Care (Signed)
Pt denies depression, anxiety, SI, HI and AVH. Pt has no goals. Pt was educated on care plan and verbalizes understanding. Faith Branan Sndier RN Problem: Education: Goal: Charity fundraiser Education information/materials will improve Outcome: Progressing Goal: Emotional status will improve Outcome: Not Progressing Goal: Mental status will improve Outcome: Not Progressing Goal: Verbalization of understanding the information provided will improve Outcome: Progressing   Problem: Activity: Goal: Interest or engagement in activities will improve Outcome: Progressing Goal: Sleeping patterns will improve Outcome: Progressing   Problem: Coping: Goal: Ability to verbalize frustrations and anger appropriately will improve Outcome: Progressing Goal: Ability to demonstrate self-control will improve Outcome: Progressing   Problem: Health Behavior/Discharge Planning: Goal: Identification of resources available to assist in meeting health care needs will improve Outcome: Progressing Goal: Compliance with treatment plan for underlying cause of condition will improve Outcome: Progressing   Problem: Physical Regulation: Goal: Ability to maintain clinical measurements within normal limits will improve Outcome: Progressing   Problem: Safety: Goal: Periods of time without injury will increase Outcome: Progressing

## 2019-02-15 NOTE — Progress Notes (Signed)
Recreation Therapy Notes  Date: 02/15/2019  Time: 9:30 am   Location: Craft room   Behavioral response: N/A   Intervention Topic: Goals  Discussion/Intervention: Patient did not attend group.   Clinical Observations/Feedback:  Patient did not attend group.   Tirza Senteno LRT/CTRS         Jasen Hartstein 02/15/2019 12:00 PM

## 2019-02-15 NOTE — Progress Notes (Signed)
Patient continues to be on 1:1, checked on patient noted a foul smell and bowel movement under his fingernails, patient was assisted to bathroom to be cleaned up but he became resistant, pulling his pants up not following simple commands, eventually with help of other staff members he allowed staff to clean him up and change linens.  No distress noted , will continue to monitor.

## 2019-02-15 NOTE — Progress Notes (Signed)
1:1 Observation Note  Pt. Observed in his room resting with 1:1 present for safety. No distress noted at this time. Pt. Respirations even and unlabored. Will continue to monitor 1:1 for safety.

## 2019-02-16 ENCOUNTER — Inpatient Hospital Stay: Payer: Medicare Other | Admitting: Anesthesiology

## 2019-02-16 ENCOUNTER — Inpatient Hospital Stay
Admit: 2019-02-16 | Discharge: 2019-02-16 | Disposition: A | Discharge status: Home / Self Care | Payer: Medicare Other | Attending: Psychiatry | Admitting: Psychiatry

## 2019-02-16 LAB — GLUCOSE, CAPILLARY: Glucose-Capillary: 94 mg/dL (ref 70–99)

## 2019-02-16 MED ORDER — SUCCINYLCHOLINE CHLORIDE 20 MG/ML IJ SOLN
INTRAMUSCULAR | Status: AC
  Filled 2019-02-16: qty 1

## 2019-02-16 MED ORDER — SODIUM CHLORIDE 0.9 % IV SOLN
INTRAVENOUS | Status: DC | PRN
  Administered 2019-02-16: 10:00:00 via INTRAVENOUS

## 2019-02-16 MED ORDER — FLUOXETINE HCL 20 MG PO CAPS
20.0000 mg | ORAL_CAPSULE | Freq: Every day | ORAL | Status: DC
  Administered 2019-02-16 – 2019-02-22 (×6): 20 mg via ORAL
  Filled 2019-02-16 (×7): qty 1

## 2019-02-16 MED ORDER — SUCCINYLCHOLINE CHLORIDE 200 MG/10ML IV SOSY
PREFILLED_SYRINGE | INTRAVENOUS | Status: DC | PRN
  Administered 2019-02-16: 90 mg via INTRAVENOUS

## 2019-02-16 MED ORDER — KETAMINE HCL 50 MG/ML IJ SOLN
INTRAMUSCULAR | Status: AC
  Filled 2019-02-16: qty 10

## 2019-02-16 MED ORDER — KETAMINE HCL 10 MG/ML IJ SOLN
INTRAMUSCULAR | Status: DC | PRN
  Administered 2019-02-16: 80 mg via INTRAVENOUS

## 2019-02-16 NOTE — Progress Notes (Signed)
Nonverbal   Makes a few sounds and moans

## 2019-02-16 NOTE — [facility] Group Notes (Signed)
LCSW Group Therapy Note  02/16/2019 1:00 PM  Type of Therapy and Topic:  Group Therapy:  Feelings around Relapse and Recovery  Participation Level:  Did Not Attend   Description of Group:    Patients in this group will discuss emotions they experience before and after a relapse. They will process how experiencing these feelings, or avoidance of experiencing them, relates to having a relapse. Facilitator will guide patients to explore emotions they have related to recovery. Patients will be encouraged to process which emotions are more powerful. They will be guided to discuss the emotional reaction significant others in their lives may have to their relapse or recovery. Patients will be assisted in exploring ways to respond to the emotions of others without this contributing to a relapse.  Therapeutic Goals: 1. Patient will identify two or more emotions that lead to a relapse for them 2. Patient will identify two emotions that result when they relapse 3. Patient will identify two emotions related to recovery 4. Patient will demonstrate ability to communicate their needs through discussion and/or role plays   Summary of Patient Progress: X  Therapeutic Modalities:   Cognitive Behavioral Therapy Solution-Focused Therapy Assertiveness Training Relapse Prevention Therapy   Anzal Bartnick, MSW, LCSW 02/16/2019 12:43 PM  

## 2019-02-16 NOTE — Plan of Care (Signed)
Pt. Monitored for safety on the unit with 1:1 observation. Pt. Is complaint with medications with aid from staff. Pt. Able to remain safe on the unit. Pt. Mental status continues to be poor in his attention and at times confused. Pt. Participation is absent with groups or activities this shift.    Problem: Education: Goal: Mental status will improve Outcome: Not Progressing   Problem: Activity: Goal: Interest or engagement in activities will improve Outcome: Not Progressing   Problem: Education: Goal: Emotional status will improve Outcome: Progressing   Problem: Health Behavior/Discharge Planning: Goal: Compliance with treatment plan for underlying cause of condition will improve Outcome: Progressing   Problem: Safety: Goal: Periods of time without injury will increase Outcome: Progressing

## 2019-02-16 NOTE — H&P (Signed)
Tytus Willinger is an 48 y.o. male.   Chief Complaint: feeling better. Still limited communication.  HPI: schizoaffective depression with catatonia  Past Medical History:  Diagnosis Date  . Bipolar 1 disorder (HCC)   . 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 GERD (gastroesophageal reflux disease)   . Nonverbal   . Schizoaffective disorder (HCC)   . Tardive dyskinesia     History reviewed. No pertinent surgical history.  Family History  Adopted: Yes  Family history unknown: Yes   Social History:  reports that he has never smoked. He has never used smokeless tobacco. He reports that he does not drink alcohol or use drugs.  Allergies: No Known Allergies  (Not in a hospital admission)   Results for orders placed or performed during the hospital encounter of 01/29/19 (from the past 48 hour(s))  Glucose, capillary     Status: None   Collection Time: 02/16/19  6:53 AM  Result Value Ref Range   Glucose-Capillary 94 70 - 99 mg/dL   Comment 1 Notify RN    No results found.  Review of Systems  Constitutional: Negative.   HENT: Negative.   Eyes: Negative.   Respiratory: Negative.   Cardiovascular: Negative.   Gastrointestinal: Negative.   Musculoskeletal: Negative.   Skin: Negative.   Neurological: Negative.   Psychiatric/Behavioral: Positive for depression and memory loss. Negative for hallucinations, substance abuse and suicidal ideas. The patient is not nervous/anxious and does not have insomnia.     Blood pressure 114/71, pulse 75, temperature 98.8 F (37.1 C), temperature source Oral, resp. rate 18, SpO2 98 %. Physical Exam  Nursing note and vitals reviewed. Constitutional: He appears well-developed and well-nourished.  HENT:  Head: Normocephalic and atraumatic.  Eyes: Pupils are equal, round, and reactive to light. Conjunctivae are normal.  Neck: Normal range of motion.  Cardiovascular: Regular rhythm and normal heart sounds.  Respiratory: Effort normal.  GI: Soft.   Musculoskeletal: Normal range of motion.  Neurological: He is alert.  Skin: Skin is warm and dry.  Psychiatric: Judgment normal. His affect is blunt. His speech is delayed. He is slowed. Thought content is not paranoid. Cognition and memory are impaired. He expresses no suicidal ideation. He is noncommunicative.     Assessment/Plan Showing slow improvement. Continue ECT and medication  Mordecai Rasmussen, MD 02/16/2019, 9:47 AM

## 2019-02-16 NOTE — Progress Notes (Signed)
[facility]  02/16/2019 4:16 PM Troy DuhamelStephen Fox  MRN:  [id number] Subjective: Follow-up for this gentleman with schizoaffective disorder who was admitted to the hospital with catatonia.  Patient had ECT today and once again the treatment was completed without any complication.  Prior to treatment the patient was conversing although less so than yesterday.  Answering only in single words.  After treatment he looks pretty tired today.  Able to answer a few questions yes or no but otherwise not doing much.  Not able to give any other history.  Patient has certainly improved from his initial catatonia but remains extremely impaired.  Needs assistance with ambulating and eating.  It remains difficult to determine his mental status as far as amount of psychosis or mood given his inability to communicate. Principal Problem: Schizoaffective disorder, depressive type (HCC) Diagnosis: Principal Problem:   Schizoaffective disorder, depressive type (HCC) Active Problems:   Catatonia   Mild malnutrition (HCC)  Total Time spent with patient: 30 minutes  Past Psychiatric History: Long history of chronic mental illness which has decompensated badly in the last 2 years with now an extended period of depression and catatonia  Past Medical History:  Past Medical History:  Diagnosis Date  . Bipolar 1 disorder (HCC)   . 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 GERD (gastroesophageal reflux disease)   . Nonverbal   . Schizoaffective disorder (HCC)   . Tardive dyskinesia    History reviewed. No pertinent surgical history. Family History:  Family History  Adopted: Yes  Family history unknown: Yes   Family Psychiatric  History: None Social History:  Social History   Substance and Sexual Activity  Alcohol Use Never  . Frequency: Never     Social History   Substance and Sexual Activity  Drug Use Never    Social History   Socioeconomic History  . Marital status: Single    Spouse name: Not on file   . Number of children: Not on file  . Years of education: Not on file  . Highest education level: Not on file  Occupational History  . Not on file  Social Needs  . Financial resource strain: Not on file  . Food insecurity:    Worry: Not on file    Inability: Not on file  . Transportation needs:    Medical: Not on file    Non-medical: Not on file  Tobacco Use  . Smoking status: Never Smoker  . Smokeless tobacco: Never Used  Substance and Sexual Activity  . Alcohol use: Never    Frequency: Never  . Drug use: Never  . Sexual activity: Not Currently  Lifestyle  . Physical activity:    Days per week: Not on file    Minutes per session: Not on file  . Stress: Not on file  Relationships  . Social connections:    Talks on phone: Not on file    Gets together: Not on file    Attends religious service: Not on file    Active member of club or organization: Not on file    Attends meetings of clubs or organizations: Not on file    Relationship status: Not on file  Other Topics Concern  . Not on file  Social History Narrative  . Not on file   Additional Social History:    Pain Medications: please see mar Prescriptions: please see mar Over the Counter: please see mar History of alcohol / drug use?: No history of alcohol / drug abuse  Longest period of sobriety (when/how long): NA                    Sleep: Fair  Appetite:  Poor  Current Medications: Current Facility-Administered Medications  Medication Dose Route Frequency Provider Last Rate Last Dose  . acetaminophen (TYLENOL) tablet 650 mg  650 mg Oral Q6H PRN Clapacs, John T, MD      . alum & mag hydroxide-simeth (MAALOX/MYLANTA) 200-200-20 MG/5ML suspension 30 mL  30 mL Oral Q4H PRN Clapacs, John T, MD      . asenapine (SAPHRIS) sublingual tablet 10 mg  10 mg Sublingual BID Clapacs, John T, MD   10 mg at 02/16/19 1145  . magnesium hydroxide (MILK OF MAGNESIA) suspension 30 mL  30 mL Oral Daily PRN Clapacs, John T,  MD      . ondansetron (ZOFRAN) injection 4 mg  4 mg Intravenous Once PRN Yves Dill, MD      . pantoprazole (PROTONIX) EC tablet 40 mg  40 mg Oral Daily Clapacs, Jackquline Denmark, MD   40 mg at 02/16/19 0819  . polyethylene glycol (MIRALAX / GLYCOLAX) packet 17 g  17 g Oral Daily Clapacs, John T, MD   17 g at 02/13/19 0830  . traZODone (DESYREL) tablet 100 mg  100 mg Oral QHS Clapacs, Jackquline Denmark, MD   100 mg at 02/15/19 2146  . vitamin B-12 (CYANOCOBALAMIN) tablet 1,000 mcg  1,000 mcg Oral Daily Clapacs, Jackquline Denmark, MD   1,000 mcg at 02/16/19 1144    Lab Results:  Results for orders placed or performed during the hospital encounter of 01/29/19 (from the past 48 hour(s))  Glucose, capillary     Status: None   Collection Time: 02/16/19  6:53 AM  Result Value Ref Range   Glucose-Capillary 94 70 - 99 mg/dL   Comment 1 Notify RN     Blood Alcohol level:  Lab Results  Component Value Date   ETH <10 01/24/2019    Metabolic Disorder Labs: Lab Results  Component Value Date   HGBA1C 4.9 01/30/2019   MPG 93.93 01/30/2019   No results found for: PROLACTIN No results found for: CHOL, TRIG, HDL, CHOLHDL, VLDL, LDLCALC  Physical Findings: AIMS: Facial and Oral Movements Muscles of Facial Expression: None, normal Lips and Perioral Area: None, normal Jaw: None, normal Tongue: None, normal,Extremity Movements Upper (arms, wrists, hands, fingers): None, normal Lower (legs, knees, ankles, toes): None, normal, Trunk Movements Neck, shoulders, hips: None, normal, Overall Severity Severity of abnormal movements (highest score from questions above): None, normal Incapacitation due to abnormal movements: None, normal Patient's awareness of abnormal movements (rate only patient's report): No Awareness, Dental Status Current problems with teeth and/or dentures?: No Does patient usually wear dentures?: No  CIWA:    COWS:     Musculoskeletal: Strength & Muscle Tone: within normal limits Gait & Station:  unsteady Patient leans: N/A  Psychiatric Specialty Exam: Physical Exam  Nursing note and vitals reviewed. Constitutional: He appears well-developed and well-nourished.  HENT:  Head: Normocephalic and atraumatic.  Eyes: Pupils are equal, round, and reactive to light. Conjunctivae are normal.  Neck: Normal range of motion.  Cardiovascular: Regular rhythm and normal heart sounds.  Respiratory: Effort normal.  GI: Soft.  Musculoskeletal: Normal range of motion.  Neurological: He is alert.  Skin: Skin is warm and dry.  Psychiatric: His affect is blunt. He is slowed. He is noncommunicative.    Review of Systems  Unable to perform ROS: Psychiatric disorder  Blood pressure 113/74, pulse 79, temperature 99.2 F (37.3 C), temperature source Oral, resp. rate 18, height  (1.651 m), weight 63.5 kg, SpO2 99 %.Body mass index is 23.3 kg/m.  General Appearance: Disheveled  Eye Contact:  Minimal  Speech:  Slow  Volume:  Decreased  Mood:  Euthymic  Affect:  Flat  Thought Process:  Disorganized  Orientation:  Other:  Oriented to basic situation although as far as date or exact town he is confused much of the time  Thought Content:  Negative  Suicidal Thoughts:  No  Homicidal Thoughts:  No  Memory:  Immediate;   Poor Recent;   Poor Remote;   Poor  Judgement:  Impaired  Insight:  Shallow  Psychomotor Activity:  Decreased  Concentration:  Concentration: Negative  Recall:  Negative  Fund of Knowledge:  Negative  Language:  Negative  Akathisia:  No  Handed:  Right  AIMS (if indicated):     Assets:  Health and safety inspector Housing Physical Health Social Support  ADL's:  Impaired  Cognition:  Impaired,  Moderate and Severe  Sleep:  Number of Hours: 4.5     Treatment Plan Summary: Daily contact with patient to assess and evaluate symptoms and progress in treatment, Medication management and Plan Patient has been receiving bilateral ECT with the goal of resolving  catatonia and returning him to a more normal mental state.  His worst of his catatonia has improved although he remains very flat down and negative.  He is able to swallow pills a little better.  I have not been able to get any coherent answer out of him as to what if any antipsychotics have worked best but I am going to go ahead and add an antidepressant medicine at this point in the hopes that that will also help to treat his condition.  Patient's presentation waxes and wanes from day-to-day.  Next ECT Monday.  Mordecai Rasmussen, MD 02/16/2019, 4:16 PM

## 2019-02-16 NOTE — Progress Notes (Signed)
D: Pt during assessments very minimal and difficult to keep his attention/concentration. Pt. Eye contract is avertive and brief. Pt. Endorses a normal mood for the most part, but overall difficult to assess. Pt. Denies pain. Pt. Denies anxiety/depression. Pt. Affect flat/blunted.    A: Q x 4 hour observation checks were completed for safety. Patient was provided with education. Patient was given/offered medications per orders. Patient  was encourage to attend groups, participate in unit activities and continue with plan of care. Pt. Chart and plans of care reviewed. Pt. Given support and encouragement.   R: Patient is complaint with medication with help from staff and this Clinical research associate. Pt. This shift isolative and withdrawn to his bed, but able to do some range of motion exercises with this Clinical research associate. Skin care and protection interventions implemented for safety per nursing judgement. Pt. NPO maintained. Pt. Blood sugars monitored per ECT. High falls risk interventions implemented for safety.              Precautionary 1:1 for safety maintained, room free of safety hazards, patient sustains no injury or falls during this shift. Will endorse care to next shift.

## 2019-02-16 NOTE — Anesthesia Postprocedure Evaluation (Signed)
Anesthesia Post Note  Patient: Troy Fox  Procedure(s) Performed: ECT TX  Patient location during evaluation: PACU Anesthesia Type: General Level of consciousness: awake and alert Pain management: pain level controlled Vital Signs Assessment: post-procedure vital signs reviewed and stable Respiratory status: spontaneous breathing, nonlabored ventilation and respiratory function stable Cardiovascular status: blood pressure returned to baseline and stable Postop Assessment: no signs of nausea or vomiting Anesthetic complications: no     Last Vitals:  Vitals:   02/16/19 1122 02/16/19 1130  BP: 112/79 112/76  Pulse: 95 94  Resp: 17 (!) 23  Temp:  36.4 C  SpO2: 95% 93%    Last Pain:  Vitals:   02/16/19 1130  TempSrc:   PainSc: 0-No pain                 Staton Markey

## 2019-02-16 NOTE — Transfer of Care (Signed)
Immediate Anesthesia Transfer of Care Note  Patient: Troy Fox  Procedure(s) Performed: ECT TX  Patient Location: PACU  Anesthesia Type:General  Level of Consciousness: sedated  Airway & Oxygen Therapy: Patient Spontanous Breathing and Patient connected to face mask oxygen  Post-op Assessment: Report given to RN and Post -op Vital signs reviewed and stable  Post vital signs: Reviewed and stable  Last Vitals:  Vitals Value Taken Time  BP 139/120 02/16/2019 10:31 AM  Temp    Pulse 118 02/16/2019 10:31 AM  Resp 24 02/16/2019 10:31 AM  SpO2 91 % 02/16/2019 10:31 AM  Vitals shown include unvalidated device data.  Last Pain:  Vitals:   02/16/19 0928  TempSrc:   PainSc: 0-No pain         Complications: No apparent anesthesia complications

## 2019-02-16 NOTE — Progress Notes (Signed)
Pt had a good time outside in the courtyard and also had a great walk assisted. [name] RN

## 2019-02-16 NOTE — Progress Notes (Signed)
Knows name and that he is in hospital

## 2019-02-16 NOTE — Plan of Care (Signed)
Pt denies depression, anxiety, SI, HI and AVH. Pt was educated on care plan and verbalizes understanding. [name] RN Problem: Education: Goal: Knowledge of [place] [facility] Education information/materials will improve Outcome: Progressing Goal: Emotional status will improve Outcome: Not Progressing Goal: Mental status will improve Outcome: Not Progressing Goal: Verbalization of understanding the information provided will improve Outcome: Progressing   Problem: Activity: Goal: Interest or engagement in activities will improve Outcome: Progressing Goal: Sleeping patterns will improve Outcome: Progressing   Problem: Coping: Goal: Ability to verbalize frustrations and anger appropriately will improve Outcome: Progressing Goal: Ability to demonstrate self-control will improve Outcome: Progressing   Problem: Health Behavior/Discharge Planning: Goal: Identification of resources available to assist in meeting health care needs will improve Outcome: Progressing Goal: Compliance with treatment plan for underlying cause of condition will improve Outcome: Progressing   Problem: Physical Regulation: Goal: Ability to maintain clinical measurements within normal limits will improve Outcome: Progressing   Problem: Safety: Goal: Periods of time without injury will increase Outcome: Progressing

## 2019-02-16 NOTE — Progress Notes (Signed)
Troy Fox, the  MHT says that the pt is getting poop under his fingernails from not being able to wipe. After the pt has a bm a MHT needs to wipe so he will be completely clean. [name] RN

## 2019-02-16 NOTE — BHH Counselor (Signed)
Pt is still receiving ECT, limited and delayed verbal communication.  Physician(J.Clapps) reports pt will remain in the hospital, no discharge plan established at this time.

## 2019-02-16 NOTE — Anesthesia Post-op Follow-up Note (Signed)
Anesthesia QCDR form completed.        

## 2019-02-16 NOTE — Progress Notes (Signed)
Pt got back from ECT. Vital signs are stable. Pt had his lunch and is calm, cooperative and resting. Location manager

## 2019-02-16 NOTE — Procedures (Signed)
ECT SERVICES Physician's Interval Evaluation & Treatment Note  Patient Identification: Troy Fox MRN:  [id number] Date of Evaluation:  02/16/2019 TX #: [id number]  MADRS:   MMSE:   P.E. Findings:  Still under nourished and weak.  No other new findings heart and lungs normal  Psychiatric Interval Note:  Still only minimal cooperation minimal movement and speech  Subjective:  Patient is a 48 y.o. male seen for evaluation for Electroconvulsive Therapy. No specific complaint but does not communicate well.  Does not appear to be in distress but does appear to be flat and withdrawn  Treatment Summary:   []   Right Unilateral             [x]  Bilateral   % Energy : 1.0 ms 100%   Impedance: 1610 ohms  Seizure Energy Index: 13,486 V squared  Postictal Suppression Index: 94%  Seizure Concordance Index: 94%  Medications  Pre Shock: Ketamine 80 mg succinylcholine 90 mg  Post Shock:    Seizure Duration: 23 seconds EMG 23 seconds EEG   Comments: Next treatment Monday  Lungs:  [x]   Clear to auscultation               []  Other:   Heart:    [x]   Regular rhythm             []  irregular rhythm    [x]   Previous H&P reviewed, patient examined and there are NO CHANGES                 []   Previous H&P reviewed, patient examined and there are changes noted.   Mordecai Rasmussen, MD 6/5/20204:20 PM

## 2019-02-16 NOTE — Anesthesia Preprocedure Evaluation (Signed)
Anesthesia Evaluation  Patient identified by MRN, date of birth, ID band Patient awake  General Assessment Comment:Review limited to chart review, limited verbal communication  Reviewed: Allergy & Precautions, NPO status , Patient's Chart, lab work & pertinent test results  History of Anesthesia Complications Negative for: history of anesthetic complications  Airway Mallampati: II  TM Distance: >3 FB Neck ROM: Full    Dental  (+) Poor Dentition   Pulmonary neg pulmonary ROS, neg sleep apnea, neg COPD,    breath sounds clear to auscultation- rhonchi (-) wheezing      Cardiovascular (-) hypertension(-) CAD, (-) Past MI, (-) Cardiac Stents and (-) CABG  Rhythm:Regular Rate:Normal - Systolic murmurs and - Diastolic murmurs    Neuro/Psych neg Seizures PSYCHIATRIC DISORDERS Depression Bipolar Disorder Schizophrenia negative neurological ROS     GI/Hepatic Neg liver ROS, GERD  ,  Endo/Other  negative endocrine ROSneg diabetes  Renal/GU negative Renal ROS     Musculoskeletal negative musculoskeletal ROS (+)   Abdominal (+) - obese,   Peds  Hematology negative hematology ROS (+)   Anesthesia Other Findings Past Medical History: No date: Bipolar 1 disorder (HCC) No date: 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HCC) No date: GERD (gastroesophageal reflux disease) No date: Nonverbal No date: Schizoaffective disorder (HCC) No date: Tardive dyskinesia   Reproductive/Obstetrics                             Anesthesia Physical  Anesthesia Plan  ASA: II  Anesthesia Plan: General   Post-op Pain Management:    Induction: Intravenous  PONV Risk Score and Plan: 1 and Ondansetron  Airway Management Planned: Mask  Additional Equipment:   Intra-op Plan:   Post-operative Plan:   Informed Consent: I have reviewed the patients History and Physical, chart, labs and discussed the procedure including the risks,  benefits and alternatives for the proposed anesthesia with the patient or authorized representative who has indicated his/her understanding and acceptance.     Dental advisory given  Plan Discussed with: CRNA and Anesthesiologist  Anesthesia Plan Comments:         Anesthesia Quick Evaluation  

## 2019-02-16 NOTE — Progress Notes (Signed)
I assessed patient's skin and there was no sign of redness etc.He was then taken to the day room. [name] RN

## 2019-02-16 NOTE — Progress Notes (Signed)
Recreation Therapy Notes  Date: 02/16/2019   Time: 9:30 am   Location: Craft room   Behavioral response: N/A   Intervention Topic: Relaxation   Discussion/Intervention: Patient did not attend group.   Clinical Observations/Feedback:  Patient did not attend group.   Avyn Aden LRT/CTRS        Thereasa Iannello 02/16/2019 10:40 AM 

## 2019-02-16 NOTE — Progress Notes (Addendum)
Patient arrived to the unit and was able to accurately perform a high five (raise hand to clap hands together) with the nurse when asked. Previously he had no expression or movements.He presented with a smile. He was unable to complete the Mini Mental Status Inventory (MMS) and Judge Stall Depression Rating Scale (MADRS) due to limited speech and possible limited cognition. He was able to state his name and birth date. This was a vast improvement from the first time he arrived to ECT. He is able to get out of the transport wheelchair and get into bed with minimal assistance. MD notified of his inability to complete the MMS and MADRS.

## 2019-02-16 NOTE — Progress Notes (Signed)
1:1 Observation Note  Pt. Observed in his room sleeping with 1:1 present for safety. No distress noted at this time. Pt. Respirations even and unlabored. Will continue to monitor 1:1 for safety.  

## 2019-02-17 NOTE — [facility] Group Notes (Signed)
LCSW Group Therapy Note  02/17/2019 1:15pm  Type of Therapy and Topic:  Group Therapy:  Healthy Self Image and Positive Change  Participation Level:  Did Not Attend   Description of Group:  In this group, patients will compare and contrast their current "I am...." statements to the visions they identify as desirable for their lives.  Patients discuss fears and how they can make positive changes in their cognitions that will positively impact their behaviors.  Facilitator played a motivational 3-minute speech and patients were left with the task of thinking about what "I am...." statements they can start using in their lives immediately.  Therapeutic Goals: 1. Patient will state their current self-perception as expressed in an "I Am" statement 2. Patient will contrast this with their desired vision for their live 3. Patient will identify 3 fears that negatively impact their behavior 4. Patient will discuss cognitive distortions that stem from their fears 5. Patient will verbalize statements that challenge their cognitive distortions  Summary of Patient Progress:  Pt was invited to attend group but chose not to attend. CSW will continue to encourage pt to attend group throughout their admission.     Therapeutic Modalities Cognitive Behavioral Therapy Motivational Interviewing  Zamier Eggebrecht  CUEBAS-COLON, LCSW 02/17/2019 12:51 PM  

## 2019-02-17 NOTE — Progress Notes (Signed)
12 PM-1 PM Continuous 1:1 monitoring  12:00 Patient  Returned to bed  Moving about freely  With all extremities  Affect cheerful  1:00 PM writer has been trying to get patient to eat sandwich from lunch tray   For the last 30 minutes . 2:00  PM  Patient remains in bed sleeping  Able to move all extremities  2:30  Patient  Awake  Did not want to go outside ,dosent respond  When asked  If he needed to go to bathroom  3:00 PM Patient remains in bed sleeping  Able to move all extremities

## 2019-02-17 NOTE — Progress Notes (Signed)
4 PM -7 PM 1:1 Continuous  Monitoring  4 PM Patient in bed awake  Able to move all extremities  4:30  Escorted to dayroom  For dinner  Patient ate  All his meal  5:00 Working on a puzzle  In dayroom 6:00 Continue to work on puzzle in dayroom   7:00 Patient return to room sitting in chair

## 2019-02-17 NOTE — Progress Notes (Signed)
[facility]  02/17/2019 9:23 AM Troy DuhamelStephen Fox  MRN:  [id number] Subjective:   Patient seen in his room with 1-1 precautions continuing he remains selectively mute he nods his head for answers when asked him if he is hearing things he nods in a no fashion, when asked him if he wants to harm himself he again nods no but again he will not speak again mutism has returned staff reports that it is intermittent. Principal Problem: Schizoaffective disorder, depressive type (HCC) Diagnosis: Principal Problem:   Schizoaffective disorder, depressive type (HCC) Active Problems:   Catatonia   Mild malnutrition (HCC)  Total Time spent with patient: 20 minutes  Past Psychiatric History: Catatonia  Past Medical History:  Past Medical History:  Diagnosis Date  . Bipolar 1 disorder (HCC)   . 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 GERD (gastroesophageal reflux disease)   . Nonverbal   . Schizoaffective disorder (HCC)   . Tardive dyskinesia    History reviewed. No pertinent surgical history. Family History:  Family History  Adopted: Yes  Family history unknown: Yes   Family Psychiatric  History: no new info Social History:  Social History   Substance and Sexual Activity  Alcohol Use Never  . Frequency: Never     Social History   Substance and Sexual Activity  Drug Use Never    Social History   Socioeconomic History  . Marital status: Single    Spouse name: Not on file  . Number of children: Not on file  . Years of education: Not on file  . Highest education level: Not on file  Occupational History  . Not on file  Social Needs  . Financial resource strain: Not on file  . Food insecurity:    Worry: Not on file    Inability: Not on file  . Transportation needs:    Medical: Not on file    Non-medical: Not on file  Tobacco Use  . Smoking status: Never Smoker  . Smokeless tobacco: Never Used  Substance and Sexual Activity  . Alcohol use: Never    Frequency: Never  . Drug  use: Never  . Sexual activity: Not Currently  Lifestyle  . Physical activity:    Days per week: Not on file    Minutes per session: Not on file  . Stress: Not on file  Relationships  . Social connections:    Talks on phone: Not on file    Gets together: Not on file    Attends religious service: Not on file    Active member of club or organization: Not on file    Attends meetings of clubs or organizations: Not on file    Relationship status: Not on file  Other Topics Concern  . Not on file  Social History Narrative  . Not on file   Additional Social History:    Pain Medications: please see mar Prescriptions: please see mar Over the Counter: please see mar History of alcohol / drug use?: No history of alcohol / drug abuse Longest period of sobriety (when/how long): NA                    Sleep: Fair  Appetite:  Fair  Current Medications: Current Facility-Administered Medications  Medication Dose Route Frequency Provider Last Rate Last Dose  . acetaminophen (TYLENOL) tablet 650 mg  650 mg Oral Q6H PRN Clapacs, Jackquline DenmarkJohn T, MD      . alum & mag hydroxide-simeth (MAALOX/MYLANTA) 200-200-20 MG/5ML suspension  30 mL  30 mL Oral Q4H PRN Clapacs, John T, MD      . asenapine (SAPHRIS) sublingual tablet 10 mg  10 mg Sublingual BID Clapacs, Madie Reno, MD   10 mg at 02/17/19 0827  . FLUoxetine (PROZAC) capsule 20 mg  20 mg Oral Daily Clapacs, Madie Reno, MD   20 mg at 02/17/19 0827  . magnesium hydroxide (MILK OF MAGNESIA) suspension 30 mL  30 mL Oral Daily PRN Clapacs, John T, MD      . ondansetron (ZOFRAN) injection 4 mg  4 mg Intravenous Once PRN Alvin Critchley, MD      . pantoprazole (PROTONIX) EC tablet 40 mg  40 mg Oral Daily Clapacs, Madie Reno, MD   40 mg at 02/17/19 0827  . polyethylene glycol (MIRALAX / GLYCOLAX) packet 17 g  17 g Oral Daily Clapacs, Madie Reno, MD   17 g at 02/17/19 0827  . traZODone (DESYREL) tablet 100 mg  100 mg Oral QHS Clapacs, John T, MD   100 mg at 02/16/19 2150  .  vitamin B-12 (CYANOCOBALAMIN) tablet 1,000 mcg  1,000 mcg Oral Daily Clapacs, Madie Reno, MD   1,000 mcg at 02/17/19 0827    Lab Results:  Results for orders placed or performed during the hospital encounter of 01/29/19 (from the past 48 hour(s))  Glucose, capillary     Status: None   Collection Time: 02/16/19  6:53 AM  Result Value Ref Range   Glucose-Capillary 94 70 - 99 mg/dL   Comment 1 Notify RN     Blood Alcohol level:  Lab Results  Component Value Date   ETH <10 71/02/2693    Metabolic Disorder Labs: Lab Results  Component Value Date   HGBA1C 4.9 01/30/2019   MPG 93.93 01/30/2019   No results found for: PROLACTIN No results found for: CHOL, TRIG, HDL, CHOLHDL, VLDL, LDLCALC  Physical Findings: AIMS: Facial and Oral Movements Muscles of Facial Expression: None, normal Lips and Perioral Area: None, normal Jaw: None, normal Tongue: None, normal,Extremity Movements Upper (arms, wrists, hands, fingers): None, normal Lower (legs, knees, ankles, toes): None, normal, Trunk Movements Neck, shoulders, hips: None, normal, Overall Severity Severity of abnormal movements (highest score from questions above): None, normal Incapacitation due to abnormal movements: None, normal Patient's awareness of abnormal movements (rate only patient's report): No Awareness, Dental Status Current problems with teeth and/or dentures?: No Does patient usually wear dentures?: No  CIWA:    COWS:     Musculoskeletal: Strength & Muscle Tone: decreased Gait & Station: unable to stand Patient leans: N/A  Psychiatric Specialty Exam: Physical Exam  ROS  Blood pressure 103/63, pulse 80, temperature 98.7 F (37.1 C), temperature source Oral, resp. rate 16, height 5\' 5"  (1.651 m), weight 63.5 kg, SpO2 99 %.Body mass index is 23.3 kg/m.  General Appearance: Casual and Disheveled  Eye Contact:  None  Speech:  Mutism  Volume:  Mutism  Mood:  Dysphoric  Affect:  Blunt  Thought Process:   Disorganized  Orientation:  Other:  Unable to discern presumed to be oriented to person place situation  Thought Content:  Illogical and Possibly disorganized  Suicidal Thoughts:  No  Homicidal Thoughts:  No  Memory:  Unable to fully test  Judgement:  Impaired  Insight:  Fair  Psychomotor Activity:  Decreased  Concentration:  Concentration: Poor  Recall:  Poor  Fund of Knowledge:  Poor  Language:  Poor  Akathisia:  Negative  Handed:  Right  AIMS (if indicated):  Assets:  Physical Health Resilience  ADL's:  Impaired  Cognition:  Impaired,  Moderate  Sleep:  Number of Hours: 3.75     Treatment Plan Summary: Daily contact with patient to assess and evaluate symptoms and progress in treatment, Medication management and Plan Continue with ECT no change in oral medications or precautions unable to discern more than information above due to mutism but no acute dangerousness as far as his behavioral issues  Colyn Miron, MD 02/17/2019, 9:23 AM

## 2019-02-17 NOTE — Progress Notes (Signed)
1:1 observation 1900-Patient resting in bed. Denies SI, HI and AV hallucinations. MHT within reach. Offers little in conversation. Minimal interaction. Was in the dayroom earlier in his wheelchair. 2000-Patient continues on 1:1 observation. MHT within reach. Safety maintained. 2100-Patient compliant with medications. MHT within reach. Safety maintained. 2200-Patient continues to rest in bed. MHT within reach. Safety maintained. 2300-Patient continues to rest in bed. MHT within reach. Safety maintained. 0000-Patient continues to rest in bed without complaints. MHT within reach. Safety maintained. 0100-Patient continues to rest in bed in no acute distress. MHT within reach. Safety maintained. 0200-Patient continues to rest in bed. MHT within reach. Safety maintained. 0300-Patient continues to rest in bed. MHT within reach. Safety maintained. 0400-Patient continues to rest in bed. MHT within reach. Safety maintained. 0500-Patient continues to rest in bed. MHT within reach. Safety maintained.  0600-Patient continues to rest in bed. MHT within reach. Safety maintained. 0700-Patient continues to rest in room. MHT within reach. Safety maintained.

## 2019-02-17 NOTE — Progress Notes (Signed)
7 AM -11 AM Continuous Monitoring 1:1    7 AM  Patient sitting in wheelchair  Voice no comments   Patient just looks at staff  7:30 AM  Patient  Taken to dayroom  For breakfast  Patient only drank his grape juice 8:24  Returned to room  Remained in wheelchair  Received am medication  Noted  dificulties  Processing  Information  9:00 Remained  In wheel chair Voice no comments or concerns  10:30 AM  Patient walked around in room . Gait unsteady  Sat on edge of bed . 11:00 Patient up  From bed  Got onto  Recliner .

## 2019-02-17 NOTE — Plan of Care (Signed)
Staff  help assist patient  with understanding information received . Emotional and mental status improving Limited  Involvement   With unit programing   Patient  Not attending  Unit programing . Staff continue to help with directing coping and  Decision  Making . Periods of sleeping during shift  Patient has 1;1  for safety needs   Problem: Education: Goal: Knowledge of [facility] Education information/materials will improve Outcome: Progressing Goal: Emotional status will improve Outcome: Progressing Goal: Mental status will improve Outcome: Progressing Goal: Verbalization of understanding the information provided will improve Outcome: Progressing   Problem: Activity: Goal: Interest or engagement in activities will improve Outcome: Progressing Goal: Sleeping patterns will improve Outcome: Progressing   Problem: Coping: Goal: Ability to verbalize frustrations and anger appropriately will improve Outcome: Progressing Goal: Ability to demonstrate self-control will improve Outcome: Progressing   Problem: Health Behavior/Discharge Planning: Goal: Identification of resources available to assist in meeting health care needs will improve Outcome: Progressing Goal: Compliance with treatment plan for underlying cause of condition will improve Outcome: Progressing   Problem: Safety: Goal: Periods of time without injury will increase Outcome: Progressing

## 2019-02-17 NOTE — Plan of Care (Signed)
  Problem: Education: Goal: Knowledge of  General Education information/materials will improve Outcome: Progressing Goal: Emotional status will improve Outcome: Progressing Goal: Mental status will improve Outcome: Progressing Goal: Verbalization of understanding the information provided will improve Outcome: Progressing   D: Patient resting in room, MHT within reach. Denies SI, HI, and AV hallucinations A: Continues on 1:1 observation. R: Safety maintained.

## 2019-02-18 MED ORDER — HALOPERIDOL 5 MG PO TABS
5.0000 mg | ORAL_TABLET | Freq: Four times a day (QID) | ORAL | Status: DC | PRN
  Administered 2019-02-18: 5 mg via ORAL
  Filled 2019-02-18: qty 1

## 2019-02-18 MED ORDER — LORAZEPAM 2 MG/ML IJ SOLN: 2.0000 mg | INTRAMUSCULAR | Status: DC | PRN

## 2019-02-18 MED ORDER — HALOPERIDOL LACTATE 5 MG/ML IJ SOLN: 5.0000 mg | Freq: Four times a day (QID) | INTRAMUSCULAR | Status: DC | PRN

## 2019-02-18 MED ORDER — TRAZODONE HCL 100 MG PO TABS
200.0000 mg | ORAL_TABLET | Freq: Every day | ORAL | Status: DC
  Administered 2019-02-18 – 2019-02-26 (×8): 200 mg via ORAL
  Filled 2019-02-18 (×6): qty 2

## 2019-02-18 MED ORDER — LORAZEPAM 2 MG PO TABS
2.0000 mg | ORAL_TABLET | ORAL | Status: DC | PRN
  Administered 2019-02-18: 2 mg via ORAL
  Filled 2019-02-18: qty 1

## 2019-02-18 NOTE — [facility] Group Notes (Signed)
LCSW Group Therapy Note 02/18/2019 1:15pm  Type of Therapy and Topic: Group Therapy: Feelings Around Returning Home & Establishing a Supportive Framework and Supporting Oneself When Supports Not Available  Participation Level: Did Not Attend  Description of Group:  Patients first processed thoughts and feelings about upcoming discharge. These included fears of upcoming changes, lack of change, new living environments, judgements and expectations from others and overall stigma of mental health issues. The group then discussed the definition of a supportive framework, what that looks and feels like, and how do to discern it from an unhealthy non-supportive network. The group identified different types of supports as well as what to do when your family/friends are less than helpful or unavailable  Therapeutic Goals  1. Patient will identify one healthy supportive network that they can use at discharge. 2. Patient will identify one factor of a supportive framework and how to tell it from an unhealthy network. 3. Patient able to identify one coping skill to use when they do not have positive supports from others. 4. Patient will demonstrate ability to communicate their needs through discussion and/or role plays.  Summary of Patient Progress:  Pt was invited to attend group but chose not to attend. CSW will continue to encourage pt to attend group throughout their admission.   Therapeutic Modalities Cognitive Behavioral Therapy Motivational Interviewing   Salli Bodin  CUEBAS-COLON, LCSW 02/18/2019 11:53 AM  

## 2019-02-18 NOTE — Progress Notes (Signed)
Patient refused to get up for this writer to take his evening medication.

## 2019-02-18 NOTE — Progress Notes (Signed)
1900-Patient in dayroom in wheelchair with MHT within reach. 1:1 for safety. 2000-Patient is working on a Loss adjuster, chartered in dayroom, minimal interaction with peers. 1:1 for safety. 2100-medication compliant. Continues 1:1 for safety. Ate snack. 2200-resting in bed. MHT within reach. Continues 1:1 for safety 2300-Resting in bed. MHT within reach. Continues 1:1 for safety 0000-Resting in bed. MHT within reach. Continues 1:1 for safety 0100-Resting in bed. MHT within reach. Continues 1:1 for safety. 0200-Resting in bed. MHT within reach. Continues 1:1 for safety. 0300-0700-Resting in bed. MHT within reach. Continues 1:1 for safety.

## 2019-02-18 NOTE — Progress Notes (Signed)
1:1 Patient Hourly Rounding:  1000: Patient is walking around the unit with his assigned safety sitter.  1400: Patient is laying in bed, with his assigned safety sitter present at bedside.  1800: Patient is laying in his bed, with his assigned safety sitter present at bedside.

## 2019-02-18 NOTE — Progress Notes (Addendum)
[facility]  02/18/2019 8:45 AM Troy Fox  MRN:  [id number] Subjective:   Yesterday patient would only nod and mumble a few answers however today he does give me sentence fragments and answers questions.  He states he does not have auditory or visual hallucinations when asked, states he is eating well and has no specific complaints.  Some poverty of content and does not elaborate on answers but overall showing a little more engagement in the milieu and with examiners.  No thoughts of harming self are reported when asked.  No involuntary movements. Principal Problem: Schizoaffective disorder, depressive type (HCC) Diagnosis: Principal Problem:   Schizoaffective disorder, depressive type (HCC) Active Problems:   Catatonia   Mild malnutrition (HCC)  Total Time spent with patient: 20 minutes  Past Psychiatric History: as above  Past Medical History:  Past Medical History:  Diagnosis Date  . Bipolar 1 disorder (HCC)   . 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 GERD (gastroesophageal reflux disease)   . Nonverbal   . Schizoaffective disorder (HCC)   . Tardive dyskinesia    History reviewed. No pertinent surgical history. Family History:  Family History  Adopted: Yes  Family history unknown: Yes   Family Psychiatric  History: no new data Social History:  Social History   Substance and Sexual Activity  Alcohol Use Never  . Frequency: Never     Social History   Substance and Sexual Activity  Drug Use Never    Social History   Socioeconomic History  . Marital status: Single    Spouse name: Not on file  . Number of children: Not on file  . Years of education: Not on file  . Highest education level: Not on file  Occupational History  . Not on file  Social Needs  . Financial resource strain: Not on file  . Food insecurity:    Worry: Not on file    Inability: Not on file  . Transportation needs:    Medical: Not on file    Non-medical: Not on file  Tobacco Use  .  Smoking status: Never Smoker  . Smokeless tobacco: Never Used  Substance and Sexual Activity  . Alcohol use: Never    Frequency: Never  . Drug use: Never  . Sexual activity: Not Currently  Lifestyle  . Physical activity:    Days per week: Not on file    Minutes per session: Not on file  . Stress: Not on file  Relationships  . Social connections:    Talks on phone: Not on file    Gets together: Not on file    Attends religious service: Not on file    Active member of club or organization: Not on file    Attends meetings of clubs or organizations: Not on file    Relationship status: Not on file  Other Topics Concern  . Not on file  Social History Narrative  . Not on file   Additional Social History:    Pain Medications: please see mar Prescriptions: please see mar Over the Counter: please see mar History of alcohol / drug use?: No history of alcohol / drug abuse Longest period of sobriety (when/how long): NA                    Sleep: Charted at just under half an hour  Appetite:  Fair  Current Medications: Current Facility-Administered Medications  Medication Dose Route Frequency Provider Last Rate Last Dose  . acetaminophen (TYLENOL)  tablet 650 mg  650 mg Oral Q6H PRN Clapacs, John T, MD      . alum & mag hydroxide-simeth (MAALOX/MYLANTA) 200-200-20 MG/5ML suspension 30 mL  30 mL Oral Q4H PRN Clapacs, John T, MD      . asenapine (SAPHRIS) sublingual tablet 10 mg  10 mg Sublingual BID Clapacs, Madie Reno, MD   10 mg at 02/18/19 0824  . FLUoxetine (PROZAC) capsule 20 mg  20 mg Oral Daily Clapacs, Madie Reno, MD   20 mg at 02/18/19 0824  . magnesium hydroxide (MILK OF MAGNESIA) suspension 30 mL  30 mL Oral Daily PRN Clapacs, John T, MD      . ondansetron (ZOFRAN) injection 4 mg  4 mg Intravenous Once PRN Alvin Critchley, MD      . pantoprazole (PROTONIX) EC tablet 40 mg  40 mg Oral Daily Clapacs, Madie Reno, MD   40 mg at 02/18/19 0824  . polyethylene glycol (MIRALAX / GLYCOLAX)  packet 17 g  17 g Oral Daily Clapacs, Madie Reno, MD   17 g at 02/17/19 0827  . traZODone (DESYREL) tablet 100 mg  100 mg Oral QHS Clapacs, John T, MD   100 mg at 02/17/19 2142  . vitamin B-12 (CYANOCOBALAMIN) tablet 1,000 mcg  1,000 mcg Oral Daily Clapacs, Madie Reno, MD   1,000 mcg at 02/18/19 9518    Lab Results: No results found for this or any previous visit (from the past 48 hour(s)).  Blood Alcohol level:  Lab Results  Component Value Date   ETH <10 84/16/6063    Metabolic Disorder Labs: Lab Results  Component Value Date   HGBA1C 4.9 01/30/2019   MPG 93.93 01/30/2019   No results found for: PROLACTIN No results found for: CHOL, TRIG, HDL, CHOLHDL, VLDL, LDLCALC  Physical Findings: AIMS: Facial and Oral Movements Muscles of Facial Expression: None, normal Lips and Perioral Area: None, normal Jaw: None, normal Tongue: None, normal,Extremity Movements Upper (arms, wrists, hands, fingers): None, normal Lower (legs, knees, ankles, toes): None, normal, Trunk Movements Neck, shoulders, hips: None, normal, Overall Severity Severity of abnormal movements (highest score from questions above): None, normal Incapacitation due to abnormal movements: None, normal Patient's awareness of abnormal movements (rate only patient's report): No Awareness, Dental Status Current problems with teeth and/or dentures?: No Does patient usually wear dentures?: No  CIWA:    COWS:     Musculoskeletal: Strength & Muscle Tone: decreased Gait & Station: Still in wheelchair Patient leans: N/A  Psychiatric Specialty Exam: Physical Exam  ROS  Blood pressure 107/70, pulse 81, temperature 98.7 F (37.1 C), temperature source Oral, resp. rate 18, height 5\' 5"  (1.651 m), weight 63.5 kg, SpO2 98 %.Body mass index is 23.3 kg/m.  General Appearance: Casual  Eye Contact:  Minimal  Speech:  Slow  Volume:  Decreased  Mood:  Dysphoric  Affect:  Congruent and Flat  Thought Process:  Linear and Descriptions of  Associations: Loose  Orientation:  Full (Time, Place, and Person)  Thought Content:  Rumination  Suicidal Thoughts:  No  Homicidal Thoughts:  No  Memory:  Immediate;   Fair  Judgement:  Fair  Insight:  Fair  Psychomotor Activity:  Decreased  Concentration:  Concentration: Fair  Recall:  [employer] of Knowledge:  Fair  Language:  Fair  Akathisia:  Negative  Handed:  Right  AIMS (if indicated):     Assets include general physical health and resilience  ADL's:  Intact  Cognition:  WNL  Sleep:  Number of Hours: 0.25     Treatment Plan Summary: Continue current level of monitoring, one-to-one precautions.  Continue ECT schedule, due to insomnia we will escalate the trazodone from 100 to 200 mg as a trial.  No other changes in medications or plans.  Troy Fox,Troy Descoteaux, MD 02/18/2019, 8:45 AM

## 2019-02-18 NOTE — Plan of Care (Signed)
D- Patient alert and oriented to person. Patient presented in a pleasant mood on assessment stating that he slept ok to this Probation officer. Patient can follow commands and answer questions from this writer and then at times he becomes disorganized in his thought processes. Patient denies SI, HI, AVH, and pain at this time. Patient also denies any signs/symptoms of depression/anxiety to this Probation officer. Patient had no stated goals for today.  A- Scheduled medications administered to patient, per MD orders. Support and encouragement provided.  Routine safety checks conducted every 15 minutes.  Patient informed to notify staff with problems or concerns.  R- No adverse drug reactions noted. Patient contracts for safety at this time. Patient compliant with medications and treatment plan. Patient receptive, calm, and cooperative. Patient interacts well with others on the unit.  Patient remains safe at this time.  Problem: Education: Goal: Knowledge of [facility] Education information/materials will improve Outcome: Progressing Goal: Emotional status will improve Outcome: Progressing Goal: Mental status will improve Outcome: Progressing Goal: Verbalization of understanding the information provided will improve Outcome: Progressing   Problem: Activity: Goal: Interest or engagement in activities will improve Outcome: Progressing Goal: Sleeping patterns will improve Outcome: Progressing   Problem: Coping: Goal: Ability to verbalize frustrations and anger appropriately will improve Outcome: Progressing Goal: Ability to demonstrate self-control will improve Outcome: Progressing   Problem: Health Behavior/Discharge Planning: Goal: Identification of resources available to assist in meeting health care needs will improve Outcome: Progressing Goal: Compliance with treatment plan for underlying cause of condition will improve Outcome: Progressing   Problem: Physical Regulation: Goal: Ability to  maintain clinical measurements within normal limits will improve Outcome: Progressing   Problem: Safety: Goal: Periods of time without injury will increase Outcome: Progressing

## 2019-02-18 NOTE — Plan of Care (Signed)
  Problem: Education: Goal: Knowledge of [facility] Education information/materials will improve Outcome: Not Progressing Goal: Emotional status will improve Outcome: Not Progressing Goal: Mental status will improve Outcome: Not Progressing Goal: Verbalization of understanding the information provided will improve Outcome: Not Progressing  D: Patient remains on 1:1 for safety. Ambulating in wheelchair. MHT within reach. Was out in dayroom, minimally interacting with peers. Did a word search and worked on a puzzle with another peer. Oriented x1 only. Denies SI, HI and AVH. A: continue 1:1 for safety. R: Safety maintained.

## 2019-02-19 ENCOUNTER — Inpatient Hospital Stay: Payer: Medicare Other | Admitting: Anesthesiology

## 2019-02-19 ENCOUNTER — Other Ambulatory Visit: Payer: Self-pay | Admitting: Psychiatry

## 2019-02-19 LAB — GLUCOSE, CAPILLARY: Glucose-Capillary: 82 mg/dL (ref 70–99)

## 2019-02-19 MED ORDER — KETAMINE HCL 10 MG/ML IJ SOLN
INTRAMUSCULAR | Status: DC | PRN
  Administered 2019-02-19: 80 mg via INTRAVENOUS

## 2019-02-19 MED ORDER — SUCCINYLCHOLINE CHLORIDE 20 MG/ML IJ SOLN
INTRAMUSCULAR | Status: AC
  Filled 2019-02-19: qty 1

## 2019-02-19 MED ORDER — KETAMINE HCL 50 MG/ML IJ SOLN
INTRAMUSCULAR | Status: AC
  Filled 2019-02-19: qty 10

## 2019-02-19 MED ORDER — SODIUM CHLORIDE 0.9 % IV SOLN
500.0000 mL | Freq: Once | INTRAVENOUS | Status: AC
  Administered 2019-02-19: 500 mL via INTRAVENOUS

## 2019-02-19 MED ORDER — OLANZAPINE 5 MG PO TBDP
10.0000 mg | ORAL_TABLET | Freq: Every day | ORAL | Status: DC
  Administered 2019-02-19 – 2019-03-04 (×14): 10 mg via ORAL
  Filled 2019-02-19 (×12): qty 2

## 2019-02-19 MED ORDER — ONDANSETRON HCL 4 MG/2ML IJ SOLN: 4.0000 mg | Freq: Once | INTRAMUSCULAR | Status: DC | PRN

## 2019-02-19 MED ORDER — SUCCINYLCHOLINE CHLORIDE 200 MG/10ML IV SOSY
PREFILLED_SYRINGE | INTRAVENOUS | Status: DC | PRN
  Administered 2019-02-19: 90 mg via INTRAVENOUS

## 2019-02-19 MED ORDER — FENTANYL CITRATE (PF) 100 MCG/2ML IJ SOLN: 25.0000 ug | INTRAMUSCULAR | Status: DC | PRN

## 2019-02-19 MED ORDER — SODIUM CHLORIDE 0.9 % IV SOLN
INTRAVENOUS | Status: DC | PRN
  Administered 2019-02-19: 12:00:00 via INTRAVENOUS

## 2019-02-19 NOTE — Progress Notes (Signed)
[facility] MD Progress Note  02/19/2019 5:11 PM Troy Fox  MRN:  [id number] Subjective: Patient seen chart reviewed.  Patient with schizoaffective disorder receiving ECT for catatonia.  His clinical course seems somewhat chaotic at this point.  To some observers and on some days he will be more alert and oriented able to walk around able to exchange conversation without difficulty.  To me this morning he was back to being almost catatonic.  Would not make eye contact.  Only after several repetitions would he state his name.  Not clear to me if this is the illness or the ECT that is causing some of the problems.  Seems to not be getting better as quickly as I would like. Principal Problem: Schizoaffective disorder, depressive type ([place]) Diagnosis: Principal Problem:   Schizoaffective disorder, depressive type ([place]) Active Problems:   Catatonia   Mild malnutrition (HCC)  Total Time spent with patient: 30 minutes  Past Psychiatric History: Past history of chronic schizoaffective disorder with recent severe decompensation  Past Medical History:  Past Medical History:  Diagnosis Date  . Bipolar 1 disorder ([place])   . 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 GERD (gastroesophageal reflux disease)   . Nonverbal   . Schizoaffective disorder ([place])   . Tardive dyskinesia    History reviewed. No pertinent surgical history. Family History:  Family History  Adopted: Yes  Family history unknown: Yes   Family Psychiatric  History: None known Social History:  Social History   Substance and Sexual Activity  Alcohol Use Never  . Frequency: Never     Social History   Substance and Sexual Activity  Drug Use Never    Social History   Socioeconomic History  . Marital status: Single    Spouse name: Not on file  . Number of children: Not on file  . Years of education: Not on file  . Highest education level: Not on file  Occupational History  . Not on file  Social Needs  . Financial resource strain:  Not on file  . Food insecurity:    Worry: Not on file    Inability: Not on file  . Transportation needs:    Medical: Not on file    Non-medical: Not on file  Tobacco Use  . Smoking status: Never Smoker  . Smokeless tobacco: Never Used  Substance and Sexual Activity  . Alcohol use: Never    Frequency: Never  . Drug use: Never  . Sexual activity: Not Currently  Lifestyle  . Physical activity:    Days per week: Not on file    Minutes per session: Not on file  . Stress: Not on file  Relationships  . Social connections:    Talks on phone: Not on file    Gets together: Not on file    Attends religious service: Not on file    Active member of club or organization: Not on file    Attends meetings of clubs or organizations: Not on file    Relationship status: Not on file  Other Topics Concern  . Not on file  Social History Narrative  . Not on file   Additional Social History:    Pain Medications: please see mar Prescriptions: please see mar Over the Counter: please see mar History of alcohol / drug use?: No history of alcohol / drug abuse Longest period of sobriety (when/how long): NA  Sleep: Fair  Appetite:  Fair  Current Medications: Current Facility-Administered Medications  Medication Dose Route Frequency Provider Last Rate Last Dose  . acetaminophen (TYLENOL) tablet 650 mg  650 mg Oral Q6H PRN Sophiya Morello T, MD      . alum & mag hydroxide-simeth (MAALOX/MYLANTA) 200-200-20 MG/5ML suspension 30 mL  30 mL Oral Q4H PRN Amorah Sebring T, MD      . FLUoxetine (PROZAC) capsule 20 mg  20 mg Oral Daily Yong Grieser, Jackquline DenmarkJohn T, MD   20 mg at 02/18/19 0824  . magnesium hydroxide (MILK OF MAGNESIA) suspension 30 mL  30 mL Oral Daily PRN Barnabas Henriques T, MD      . OLANZapine zydis (ZYPREXA) disintegrating tablet 10 mg  10 mg Oral QHS Armond Cuthrell T, MD      . ondansetron [facility](ZOFRAN) injection 4 mg  4 mg Intravenous Once PRN Yves Dillarroll, Paul, MD      . ondansetron  [facility](ZOFRAN) injection 4 mg  4 mg Intravenous Once PRN Yevette EdwardsAdams, James G, MD      . pantoprazole (PROTONIX) EC tablet 40 mg  40 mg Oral Daily Georgeana Oertel, Jackquline DenmarkJohn T, MD   40 mg at 02/18/19 0824  . polyethylene glycol (MIRALAX / GLYCOLAX) packet 17 g  17 g Oral Daily Adeleigh Barletta, Jackquline DenmarkJohn T, MD   17 g at 02/18/19 0826  . traZODone (DESYREL) tablet 200 mg  200 mg Oral QHS Malvin JohnsFarah, Brian, MD   200 mg at 02/18/19 2128  . vitamin B-12 (CYANOCOBALAMIN) tablet 1,000 mcg  1,000 mcg Oral Daily Benzion Mesta, Jackquline DenmarkJohn T, MD   1,000 mcg at 02/18/19 14780824    Lab Results:  Results for orders placed or performed during the hospital encounter of 01/29/19 (from the past 48 hour(s))  Glucose, capillary     Status: None   Collection Time: 02/19/19  6:19 AM  Result Value Ref Range   Glucose-Capillary 82 70 - 99 mg/dL    Blood Alcohol level:  Lab Results  Component Value Date   ETH <10 01/24/2019    Metabolic Disorder Labs: Lab Results  Component Value Date   HGBA1C 4.9 01/30/2019   MPG 93.93 01/30/2019   No results found for: PROLACTIN No results found for: CHOL, TRIG, HDL, CHOLHDL, VLDL, LDLCALC  Physical Findings: AIMS: Facial and Oral Movements Muscles of Facial Expression: None, normal Lips and Perioral Area: None, normal Jaw: None, normal Tongue: None, normal,Extremity Movements Upper (arms, wrists, hands, fingers): None, normal Lower (legs, knees, ankles, toes): None, normal, Trunk Movements Neck, shoulders, hips: None, normal, Overall Severity Severity of abnormal movements (highest score from questions above): None, normal Incapacitation due to abnormal movements: None, normal Patient's awareness of abnormal movements (rate only patient's report): No Awareness, Dental Status Current problems with teeth and/or dentures?: No Does patient usually wear dentures?: No  CIWA:    COWS:     Musculoskeletal: Strength & Muscle Tone: decreased Gait & Station: unsteady Patient leans: N/A  Psychiatric Specialty  Exam: Physical Exam  Nursing note and vitals reviewed. Constitutional: He appears well-developed and well-nourished.  HENT:  Head: Normocephalic and atraumatic.  Eyes: Pupils are equal, round, and reactive to light. Conjunctivae are normal.  Neck: Normal range of motion.  Cardiovascular: Regular rhythm and normal heart sounds.  Respiratory: Effort normal and breath sounds normal.  GI: Soft.  Musculoskeletal: Normal range of motion.  Neurological: He is alert.  Skin: Skin is warm and dry.  Psychiatric: His affect is blunt. His speech is delayed. He is slowed and withdrawn.  Cognition and memory are impaired. He is noncommunicative.    Review of Systems  Unable to perform ROS: Psychiatric disorder    Blood pressure 117/71, pulse (!) 52, temperature 97.8 F (36.6 C), resp. rate 15, height 5\' 5"  (1.651 m), weight 62.4 kg, SpO2 96 %.Body mass index is 22.88 kg/m.  General Appearance: Casual  Eye Contact:  Minimal  Speech:  Blocked and Slow  Volume:  Decreased  Mood:  Euthymic  Affect:  Flat  Thought Process:  NA  Orientation:  Negative  Thought Content:  Negative  Suicidal Thoughts:  No  Homicidal Thoughts:  No  Memory:  Negative  Judgement:  Negative  Insight:  Negative  Psychomotor Activity:  Decreased  Concentration:  Concentration: Poor  Recall:  Poor  Fund of Knowledge:  Poor  Language:  Fair  Akathisia:  No  Handed:  Right  AIMS (if indicated):     Assets:  Desire for Improvement Housing Resilience Social Support  ADL's:  Impaired  Cognition:  Impaired,  Moderate  Sleep:  Number of Hours: 8     Treatment Plan Summary: Plan Really not clear to me what the clinical course is.  I do not want to continue ECT if it is causing him to become more delirious although the way that his presentation varies from observer to observer seems odd.  Current treatment with Saphris seems not to be making much progress so I am changing it to Zyprexa at night.  Tried to bit form some  rapport with him and do some encouragement.  His ECT treatments are getting shorter and shorter despite having switch to ketamine which is very frustrating.  Not sure what else we can do with that.  We will try ECT again on Wednesday to see if we can get an adequate seizure.  Mordecai RasmussenJohn Savahna Casados, MD 02/19/2019, 5:11 PM

## 2019-02-19 NOTE — [facility] Group Notes (Signed)
LCSW Group Therapy Note   02/19/2019 12:49 PM   Type of Therapy and Topic:  Group Therapy:  Overcoming Obstacles   Participation Level:  Did Not Attend   Description of Group:    In this group patients will be encouraged to explore what they see as obstacles to their own wellness and recovery. They will be guided to discuss their thoughts, feelings, and behaviors related to these obstacles. The group will process together ways to cope with barriers, with attention given to specific choices patients can make. Each patient will be challenged to identify changes they are motivated to make in order to overcome their obstacles. This group will be process-oriented, with patients participating in exploration of their own experiences as well as giving and receiving support and challenge from other group members.   Therapeutic Goals: 1. Patient will identify personal and current obstacles as they relate to admission. 2. Patient will identify barriers that currently interfere with their wellness or overcoming obstacles.  3. Patient will identify feelings, thought process and behaviors related to these barriers. 4. Patient will identify two changes they are willing to make to overcome these obstacles:      Summary of Patient Progress x     Therapeutic Modalities:   Cognitive Behavioral Therapy Solution Focused Therapy Motivational Interviewing Relapse Prevention Therapy  Thy Gullikson, MSW, LCSW Clinical Social Work 02/19/2019 12:49 PM   

## 2019-02-19 NOTE — Anesthesia Procedure Notes (Signed)
Date/Time: 02/19/2019 11:58 AM Performed by: Dionne Bucy, CRNA Pre-anesthesia Checklist: Patient identified, Emergency Drugs available, Suction available and Patient being monitored Patient Re-evaluated:Patient Re-evaluated prior to induction Oxygen Delivery Method: Circle system utilized Preoxygenation: Pre-oxygenation with 100% oxygen Induction Type: IV induction Ventilation: Mask ventilation without difficulty and Mask ventilation throughout procedure Airway Equipment and Method: Bite block Placement Confirmation: positive ETCO2 Dental Injury: Teeth and Oropharynx as per pre-operative assessment

## 2019-02-19 NOTE — Progress Notes (Signed)
Patient's medications will be administered after ECT.

## 2019-02-19 NOTE — Transfer of Care (Signed)
Immediate Anesthesia Transfer of Care Note  Patient: Troy Fox  Procedure(s) Performed: ECT TX  Patient Location: PACU  Anesthesia Type:General  Level of Consciousness: sedated  Airway & Oxygen Therapy: Patient Spontanous Breathing and Patient connected to face mask oxygen  Post-op Assessment: Report given to RN and Post -op Vital signs reviewed and stable  Post vital signs: Reviewed and stable  Last Vitals:  Vitals Value Taken Time  BP 96/58 02/19/2019 12:18 PM  Temp 36.7 C 02/19/2019 12:08 PM  Pulse 51 02/19/2019 12:20 PM  Resp 11 02/19/2019 12:20 PM  SpO2 99 % 02/19/2019 12:20 PM  Vitals shown include unvalidated device data.  Last Pain:  Vitals:   02/19/19 1208  TempSrc:   PainSc: Asleep         Complications: No apparent anesthesia complications

## 2019-02-19 NOTE — Progress Notes (Signed)
1:1 Patient Hourly Rounding:  1000: Patient is off the unit, having ECT procedure, with his assigned safety sitter present at his bedside.  1400: Patient is asleep, in his room, with his assigned safety sitter at bedside.  1800: Patient is still asleep with his assigned safety sitter present at bedside.

## 2019-02-19 NOTE — Progress Notes (Signed)
Recreation Therapy Notes   Date: 02/19/2019  Time: 9:30 am   Location: Craft room   Behavioral response: N/A   Intervention Topic: Stress  Discussion/Intervention: Patient did not attend group.   Clinical Observations/Feedback:  Patient did not attend group.   Alyssa Mancera LRT/CTRS        Freddy Spadafora 02/19/2019 11:35 AM

## 2019-02-19 NOTE — Tx Team (Signed)
Interdisciplinary Treatment and Diagnostic Plan Update  02/19/2019 Time of Session: 830am Troy Fox MRN: [id number]  Principal Diagnosis: Schizoaffective disorder, depressive type [facility])  Secondary Diagnoses: Principal Problem:   Schizoaffective disorder, depressive type ([place]) Active Problems:   Catatonia   Mild malnutrition ([place])   Current Medications:  Current Facility-Administered Medications  Medication Dose Route Frequency Provider Last Rate Last Dose  . acetaminophen (TYLENOL) tablet 650 mg  650 mg Oral Q6H PRN Clapacs, John T, MD      . alum & mag hydroxide-simeth (MAALOX/MYLANTA) 200-200-20 MG/5ML suspension 30 mL  30 mL Oral Q4H PRN Clapacs, John T, MD      . FLUoxetine (PROZAC) capsule 20 mg  20 mg Oral Daily Clapacs, Madie Reno, MD   20 mg at 02/18/19 0824  . magnesium hydroxide (MILK OF MAGNESIA) suspension 30 mL  30 mL Oral Daily PRN Clapacs, John T, MD      . OLANZapine zydis (ZYPREXA) disintegrating tablet 10 mg  10 mg Oral QHS Clapacs, John T, MD      . ondansetron [facility]) injection 4 mg  4 mg Intravenous Once PRN Alvin Critchley, MD      . ondansetron [facility]) injection 4 mg  4 mg Intravenous Once PRN Molli Barrows, MD      . pantoprazole (PROTONIX) EC tablet 40 mg  40 mg Oral Daily Clapacs, Madie Reno, MD   40 mg at 02/18/19 0824  . polyethylene glycol (MIRALAX / GLYCOLAX) packet 17 g  17 g Oral Daily Clapacs, Madie Reno, MD   17 g at 02/18/19 0826  . traZODone (DESYREL) tablet 200 mg  200 mg Oral QHS Johnn Hai, MD   200 mg at 02/18/19 2128  . vitamin B-12 (CYANOCOBALAMIN) tablet 1,000 mcg  1,000 mcg Oral Daily Clapacs, Madie Reno, MD   1,000 mcg at 02/18/19 6378   PTA Medications: Medications Prior to Admission  Medication Sig Dispense Refill Last Dose  . atenolol (TENORMIN) 25 MG tablet Take 1 tablet (25 mg total) by mouth daily. 90 tablet 0 02/18/2019 at Unknown time  . busPIRone (BUSPAR) 5 MG tablet Take 1 tablet (5 mg total) by mouth 2 (two) times daily. (Patient taking  differently: Take 5 mg by mouth 2 (two) times daily as needed. ) 60 tablet 0 02/18/2019 at Unknown time  . Cyanocobalamin (VITAMIN B 12) 500 MCG TABS Take 500 mcg by mouth daily. 90 tablet 0 02/18/2019 at Unknown time  . LORazepam (ATIVAN) 0.5 MG tablet Take 1 tablet (0.5 mg total) by mouth every 8 (eight) hours as needed for anxiety. 90 tablet 2 02/18/2019 at Unknown time  . omeprazole (PRILOSEC) 40 MG capsule Take 1 capsule (40 mg total) by mouth daily. 90 capsule 0 02/18/2019 at Unknown time  . paliperidone (INVEGA) 6 MG 24 hr tablet Take 1 tablet (6 mg total) by mouth daily. 90 tablet 0 02/18/2019 at Unknown time  . polyethylene glycol (MIRALAX) 17 g packet Take 17 g by mouth daily as needed for moderate constipation. 72 packet 0 02/18/2019 at Unknown time  . sertraline (ZOLOFT) 50 MG tablet Take 3 tablets (150 mg total) by mouth daily. 270 tablet 0 02/18/2019 at Unknown time  . traZODone (DESYREL) 50 MG tablet Take 1 tablet (50 mg total) by mouth at bedtime. 90 tablet 0 02/18/2019 at Unknown time  . Valbenazine Tosylate (INGREZZA) 40 & 80 MG CPPK Take 40 mg by mouth daily for 7 days, THEN 80 mg daily for 30 days. 30 each 0 02/18/2019  at Unknown time  . benztropine (COGENTIN) 1 MG tablet Take 1 tablet (1 mg total) by mouth 2 (two) times daily for 7 days. 14 tablet 0 01/23/2019 at 1730    Patient Stressors: Financial difficulties Health problems  Patient Strengths: Motivation for treatment/growth Supportive family/friends  Treatment Modalities: Medication Management, Group therapy, Case management,  1 to 1 session with clinician, Psychoeducation, Recreational therapy.   Physician Treatment Plan for Primary Diagnosis: Schizoaffective disorder, depressive type ([place]) Long Term Goal(s): Improvement in symptoms so as ready for discharge Improvement in symptoms so as ready for discharge   Short Term Goals: Ability to verbalize feelings will improve Compliance with prescribed medications will improve Ability to  maintain clinical measurements within normal limits will improve  Medication Management: Evaluate patient's response, side effects, and tolerance of medication regimen.  Therapeutic Interventions: 1 to 1 sessions, Unit Group sessions and Medication administration.  Evaluation of Outcomes: Not Met  Physician Treatment Plan for Secondary Diagnosis: Principal Problem:   Schizoaffective disorder, depressive type ([place]) Active Problems:   Catatonia   Mild malnutrition ([place])  Long Term Goal(s): Improvement in symptoms so as ready for discharge Improvement in symptoms so as ready for discharge   Short Term Goals: Ability to verbalize feelings will improve Compliance with prescribed medications will improve Ability to maintain clinical measurements within normal limits will improve     Medication Management: Evaluate patient's response, side effects, and tolerance of medication regimen.  Therapeutic Interventions: 1 to 1 sessions, Unit Group sessions and Medication administration.  Evaluation of Outcomes: Not Met   RN Treatment Plan for Primary Diagnosis: Schizoaffective disorder, depressive type ([place]) Long Term Goal(s): Knowledge of disease and therapeutic regimen to maintain health will improve  Short Term Goals: Ability to identify and develop effective coping behaviors will improve and Compliance with prescribed medications will improve  Medication Management: RN will administer medications as ordered by provider, will assess and evaluate patient's response and provide education to patient for prescribed medication. RN will report any adverse and/or side effects to prescribing provider.  Therapeutic Interventions: 1 on 1 counseling sessions, Psychoeducation, Medication administration, Evaluate responses to treatment, Monitor vital signs and CBGs as ordered, Perform/monitor CIWA, COWS, AIMS and Fall Risk screenings as ordered, Perform wound care treatments as ordered.  Evaluation of  Outcomes: Not Met   LCSW Treatment Plan for Primary Diagnosis: Schizoaffective disorder, depressive type ([place]) Long Term Goal(s): Safe transition to appropriate next level of care at discharge, Engage patient in therapeutic group addressing interpersonal concerns.  Short Term Goals: Engage patient in aftercare planning with referrals and resources  Therapeutic Interventions: Assess for all discharge needs, 1 to 1 time with Social worker, Explore available resources and support systems, Assess for adequacy in community support network, Educate family and significant other(s) on suicide prevention, Complete Psychosocial Assessment, Interpersonal group therapy.  Evaluation of Outcomes: Not Met   Progress in Treatment: Attending groups: No. Participating in groups: No. Taking medication as prescribed: Yes. Toleration medication: Yes. Family/Significant other contact made: No, will contact:  when pt gives consent Patient understands diagnosis: No. Discussing patient identified problems/goals with staff: No. Medical problems stabilized or resolved: No. Denies suicidal/homicidal ideation: Yes Issues/concerns per patient self-inventory: No. Other: N/A  New problem(s) identified: No, Describe:  None reported  New Short Term/Long Term Goal(s):Pt requires assistance with walking and experiencing minimal verbal communication.   Patient Goals:  Pt unable to provide a goal at this time.  Discharge Plan or Barriers: Pt will resume outpatient ECT. 02/07/19-No  change in progress. Will receive ECT today. 02/14/19-Pt will receive ECT today. Pt able to give one word responses to some questions, unable to sign  documentation to schedule follow up care at this time. Dr. Weber Cooks reports he will contact the pt's parents to see if he can return home in his current state and resume outpatient ECT. D/C plan TBD. 02/19/19- Pt is receiving ECT, still requires assistance with walking and minimal verbal communication.  D/C plan TBD.  Reason for Continuation of Hospitalization: Medication stabilization, ECT  Estimated Length of Stay: TBD  Recreational Therapy: Patient Stressors: N/A  Patient Goal: Patient will engage in groups without prompting or encouragement from LRT x3 group sessions within 5 recreation therapy group sessions  Attendees: Patient: 02/19/2019 3:19 PM  Physician: Alethia Berthold 02/19/2019 3:19 PM  Nursing: Duwayne Heck Chisem Demetria Ravenell 02/19/2019 3:19 PM  RN Care Manager: 02/19/2019 3:19 PM  Social Worker: Sanjuana Kava Olivia Moton Assunta Curtis 02/19/2019 3:19 PM  Recreational Therapist:  02/19/2019 3:19 PM  Other:  02/19/2019 3:19 PM  Other:  02/19/2019 3:19 PM  Other: 02/19/2019 3:19 PM    Scribe for Treatment Team: Yvette Rack, LCSW 02/19/2019 3:19 PM
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
## 2019-02-19 NOTE — Procedures (Signed)
ECT SERVICES Physician's Interval Evaluation & Treatment Note  Patient Identification: Troy Fox MRN:  [id number] Date of Evaluation:  02/19/2019 TX #: [id number]  MADRS:   MMSE:   P.E. Findings:  No change to physical exam  Psychiatric Interval Note:  Intermittently slightly improved although overall remains very withdrawn little activity would not even speak to me today  Subjective:  Patient is a 48 y.o. male seen for evaluation for Electroconvulsive Therapy. Not able to do anything or say anything to me lucidly  Treatment Summary:   []   Right Unilateral             [x]  Bilateral   % Energy : 1.0 ms 100%   Impedance: 1730 ohms  Seizure Energy Index: 20,145 V squared  Postictal Suppression Index: 91%  Seizure Concordance Index: 95%  Medications  Pre Shock: Ketamine 80 mg succinylcholine 90 mg  Post Shock: 17 seconds EMG 17 seconds EEG  Seizure Duration: 17 seconds EMG 17 seconds EMG   Comments: Perhaps the only other thing we could change would be to go back to trying remifentanil again as an anesthetic.  In any case Next treatment Wednesday.  Lungs:  [x]   Clear to auscultation               []  Other:   Heart:    [x]   Regular rhythm             []  irregular rhythm    [x]   Previous H&P reviewed, patient examined and there are NO CHANGES                 []   Previous H&P reviewed, patient examined and there are changes noted.   Alethia Berthold, MD 6/8/20205:18 PM
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
## 2019-02-19 NOTE — Anesthesia Preprocedure Evaluation (Signed)
Anesthesia Evaluation  Patient identified by MRN, date of birth, ID band Patient awake    Reviewed: Allergy & Precautions, H&P , NPO status , Patient's Chart, lab work & pertinent test results, reviewed documented beta blocker date and time   Airway Mallampati: II   Neck ROM: full    Dental  (+) Poor Dentition   Pulmonary neg pulmonary ROS,    Pulmonary exam normal        Cardiovascular Exercise Tolerance: Good negative cardio ROS Normal cardiovascular exam Rhythm:regular Rate:Normal     Neuro/Psych PSYCHIATRIC DISORDERS Depression Bipolar Disorder Schizophrenia negative neurological ROS     GI/Hepatic Neg liver ROS, GERD  Medicated,  Endo/Other  negative endocrine ROS  Renal/GU negative Renal ROS  negative genitourinary   Musculoskeletal   Abdominal   Peds  Hematology negative hematology ROS (+)   Anesthesia Other Findings Past Medical History: No date: Bipolar 1 disorder ([place]) No date: 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HCC) No date: GERD (gastroesophageal reflux disease) No date: Nonverbal No date: Schizoaffective disorder ([place]) No date: Tardive dyskinesia History reviewed. No pertinent surgical history. BMI    Body Mass Index:  22.88 kg/m     Reproductive/Obstetrics negative OB ROS                             Anesthesia Physical Anesthesia Plan  ASA: III  Anesthesia Plan: General   Post-op Pain Management:    Induction:   PONV Risk Score and Plan:   Airway Management Planned:   Additional Equipment:   Intra-op Plan:   Post-operative Plan:   Informed Consent: I have reviewed the patients History and Physical, chart, labs and discussed the procedure including the risks, benefits and alternatives for the proposed anesthesia with the patient or authorized representative who has indicated his/her understanding and acceptance.     Dental Advisory Given  Plan Discussed  with: CRNA  Anesthesia Plan Comments:         Anesthesia Quick Evaluation

## 2019-02-19 NOTE — Plan of Care (Signed)
D- Patient alert and oriented to person. Patient presented in a pleasant mood on assessment. Patient was able to follow simple commands from this writer, however, he has been extremely drowsy after coming back from ECT. Patient denies SI, HI, AVH, and pain at this time. Patient also denies any signs/symptoms of depression/anxiety. Patient had no stated goals for today.  A- Support and encouragement provided. Routine safety checks conducted every 15 minutes.  R- Patient contracts for safety at this time. Patient receptive, calm, and cooperative. Patient remains safe at this time.  Problem: Education: Goal: Knowledge of [facility] Education information/materials will improve Outcome: Progressing Goal: Emotional status will improve Outcome: Progressing Goal: Mental status will improve Outcome: Progressing Goal: Verbalization of understanding the information provided will improve Outcome: Progressing   Problem: Activity: Goal: Interest or engagement in activities will improve Outcome: Progressing Goal: Sleeping patterns will improve Outcome: Progressing   Problem: Coping: Goal: Ability to verbalize frustrations and anger appropriately will improve Outcome: Progressing Goal: Ability to demonstrate self-control will improve Outcome: Progressing   Problem: Health Behavior/Discharge Planning: Goal: Identification of resources available to assist in meeting health care needs will improve Outcome: Progressing Goal: Compliance with treatment plan for underlying cause of condition will improve Outcome: Progressing   Problem: Physical Regulation: Goal: Ability to maintain clinical measurements within normal limits will improve Outcome: Progressing   Problem: Safety: Goal: Periods of time without injury will increase Outcome: Progressing

## 2019-02-19 NOTE — Anesthesia Post-op Follow-up Note (Signed)
Anesthesia QCDR form completed.        

## 2019-02-20 NOTE — Plan of Care (Signed)
  Problem: Safety: Goal: Periods of time without injury will increase Outcome: Progressing  Patient continues to be free of injury on the unit.

## 2019-02-20 NOTE — Progress Notes (Signed)
Recreation Therapy Notes  Date: 02/20/2019  Time: 9:30 am   Location: Craft room   Behavioral response: N/A   Intervention Topic: Happiness  Discussion/Intervention: Patient did not attend group.   Clinical Observations/Feedback:  Patient did not attend group.   Fatin Bachicha LRT/CTRS        Pedram Goodchild 02/20/2019 11:22 AM

## 2019-02-20 NOTE — Progress Notes (Signed)
[facility]  02/20/2019 4:16 PM Troy DuhamelStephen Fox  MRN:  [id number] Subjective: Follow-up patient with schizoaffective disorder receiving ECT.  Patient is back to being pretty withdrawn.  Saw him this morning and he was able to turn and make brief eye contact and respond with single words to questions but was not vocalizing any more than that.  He did eat meals and has been able to get up and move around his room a little bit.  Was not able to answer questions about where he was currently.  There seems to be a regression from how he had been several days ago.  Spoke with nursing staff today and I think we agree that he may be showing more delirium from his ECT at this point than anything else. Principal Problem: Schizoaffective disorder, depressive type (HCC) Diagnosis: Principal Problem:   Schizoaffective disorder, depressive type (HCC) Active Problems:   Catatonia   Mild malnutrition (HCC)  Total Time spent with patient: 30 minutes  Past Psychiatric History: Patient has a long history of schizoaffective disorder.  Recent catatonia.  Past Medical History:  Past Medical History:  Diagnosis Date  . Bipolar 1 disorder (HCC)   . 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 GERD (gastroesophageal reflux disease)   . Nonverbal   . Schizoaffective disorder (HCC)   . Tardive dyskinesia    History reviewed. No pertinent surgical history. Family History:  Family History  Adopted: Yes  Family history unknown: Yes   Family Psychiatric  History: See previous Social History:  Social History   Substance and Sexual Activity  Alcohol Use Never  . Frequency: Never     Social History   Substance and Sexual Activity  Drug Use Never    Social History   Socioeconomic History  . Marital status: Single    Spouse name: Not on file  . Number of children: Not on file  . Years of education: Not on file  . Highest education level: Not on file  Occupational History  . Not on file  Social Needs  .  Financial resource strain: Not on file  . Food insecurity:    Worry: Not on file    Inability: Not on file  . Transportation needs:    Medical: Not on file    Non-medical: Not on file  Tobacco Use  . Smoking status: Never Smoker  . Smokeless tobacco: Never Used  Substance and Sexual Activity  . Alcohol use: Never    Frequency: Never  . Drug use: Never  . Sexual activity: Not Currently  Lifestyle  . Physical activity:    Days per week: Not on file    Minutes per session: Not on file  . Stress: Not on file  Relationships  . Social connections:    Talks on phone: Not on file    Gets together: Not on file    Attends religious service: Not on file    Active member of club or organization: Not on file    Attends meetings of clubs or organizations: Not on file    Relationship status: Not on file  Other Topics Concern  . Not on file  Social History Narrative  . Not on file   Additional Social History:    Pain Medications: please see mar Prescriptions: please see mar Over the Counter: please see mar History of alcohol / drug use?: No history of alcohol / drug abuse Longest period of sobriety (when/how long): NA  Sleep: Fair  Appetite:  Fair  Current Medications: Current Facility-Administered Medications  Medication Dose Route Frequency Provider Last Rate Last Dose  . acetaminophen (TYLENOL) tablet 650 mg  650 mg Oral Q6H PRN Clapacs, John T, MD      . alum & mag hydroxide-simeth (MAALOX/MYLANTA) 200-200-20 MG/5ML suspension 30 mL  30 mL Oral Q4H PRN Clapacs, John T, MD      . FLUoxetine (PROZAC) capsule 20 mg  20 mg Oral Daily Clapacs, Madie Reno, MD   20 mg at 02/20/19 0904  . magnesium hydroxide (MILK OF MAGNESIA) suspension 30 mL  30 mL Oral Daily PRN Clapacs, John T, MD      . OLANZapine zydis (ZYPREXA) disintegrating tablet 10 mg  10 mg Oral QHS Clapacs, Madie Reno, MD   10 mg at 02/19/19 2100  . ondansetron (ZOFRAN) injection 4 mg  4 mg Intravenous  Once PRN Alvin Critchley, MD      . ondansetron [facility]) injection 4 mg  4 mg Intravenous Once PRN Molli Barrows, MD      . pantoprazole (PROTONIX) EC tablet 40 mg  40 mg Oral Daily Clapacs, Madie Reno, MD   40 mg at 02/20/19 0904  . polyethylene glycol (MIRALAX / GLYCOLAX) packet 17 g  17 g Oral Daily Clapacs, Madie Reno, MD   17 g at 02/20/19 0905  . traZODone (DESYREL) tablet 200 mg  200 mg Oral QHS Johnn Hai, MD   200 mg at 02/19/19 2100  . vitamin B-12 (CYANOCOBALAMIN) tablet 1,000 mcg  1,000 mcg Oral Daily Clapacs, Madie Reno, MD   1,000 mcg at 02/20/19 2751    Lab Results:  Results for orders placed or performed during the hospital encounter of 01/29/19 (from the past 48 hour(s))  Glucose, capillary     Status: None   Collection Time: 02/19/19  6:19 AM  Result Value Ref Range   Glucose-Capillary 82 70 - 99 mg/dL    Blood Alcohol level:  Lab Results  Component Value Date   ETH <10 70/09/7492    Metabolic Disorder Labs: Lab Results  Component Value Date   HGBA1C 4.9 01/30/2019   MPG 93.93 01/30/2019   No results found for: PROLACTIN No results found for: CHOL, TRIG, HDL, CHOLHDL, VLDL, LDLCALC  Physical Findings: AIMS: Facial and Oral Movements Muscles of Facial Expression: None, normal Lips and Perioral Area: None, normal Jaw: None, normal Tongue: None, normal,Extremity Movements Upper (arms, wrists, hands, fingers): None, normal Lower (legs, knees, ankles, toes): None, normal, Trunk Movements Neck, shoulders, hips: None, normal, Overall Severity Severity of abnormal movements (highest score from questions above): None, normal Incapacitation due to abnormal movements: None, normal Patient's awareness of abnormal movements (rate only patient's report): No Awareness, Dental Status Current problems with teeth and/or dentures?: No Does patient usually wear dentures?: No  CIWA:    COWS:     Musculoskeletal: Strength & Muscle Tone: within normal limits Gait & Station:  normal Patient leans: N/A  Psychiatric Specialty Exam: Physical Exam  Nursing note and vitals reviewed. Constitutional: He appears well-developed and well-nourished.  HENT:  Head: Normocephalic and atraumatic.  Eyes: Pupils are equal, round, and reactive to light. Conjunctivae are normal.  Neck: Normal range of motion.  Cardiovascular: Regular rhythm and normal heart sounds.  Respiratory: Effort normal.  GI: Soft.  Musculoskeletal: Normal range of motion.  Neurological: He is alert.  Skin: Skin is warm and dry.  Psychiatric: His affect is blunt. His speech is delayed. He is slowed and  withdrawn. Cognition and memory are impaired.    Review of Systems  Unable to perform ROS: Psychiatric disorder    Blood pressure (!) 109/59, pulse 94, temperature 97.8 F (36.6 C), resp. rate 16, height 5\' 5"  (1.651 m), weight 62.4 kg, SpO2 99 %.Body mass index is 22.88 kg/m.  General Appearance: Disheveled  Eye Contact:  Minimal  Speech:  Slow  Volume:  Decreased  Mood:  Euthymic  Affect:  Flat  Thought Process:  Coherent  Orientation:  Negative  Thought Content:  Negative  Suicidal Thoughts:  No  Homicidal Thoughts:  No  Memory:  Negative  Judgement:  Negative  Insight:  Negative  Psychomotor Activity:  Negative  Concentration:  Concentration: Negative  Recall:  Negative  Fund of Knowledge:  Negative  Language:  Negative  Akathisia:  Negative  Handed:  Right  AIMS (if indicated):     Assets:  Resilience Social Support  ADL's:  Impaired  Cognition:  Impaired,  Moderate and Severe  Sleep:  Number of Hours: 8.5     Treatment Plan Summary: Daily contact with patient to assess and evaluate symptoms and progress in treatment, Medication management and Plan I am going to take him off the ECT schedule for tomorrow.  I think we can give it a rest for a while.  I have switched his antipsychotic over to Zyprexa, medicine I have more trust in than the previous [place].  Continue  encouraging patient to get up get out of bed be more active.  We are also going to start conferencing with the family about possible discharge options given his limited ability.  Mordecai RasmussenJohn Clapacs, MD 02/20/2019, 4:16 PM

## 2019-02-20 NOTE — Progress Notes (Signed)
7 AM -11  AM  1:1 Continuous Monitoring 7: 00 Patient in bed at presents  Moving all extremities  Arm  Legs lower and upper body  7:30 Out of bed ambulate to dayroom for breakfast. Gait belt on . Patient gait remained unsteady. Appetite good at breakfast  08:00  In room  Bed  Awake  Moving all extremities  Arm  Legs lower and upper body  09:00 In room  Bed  Awake  Moving all extremities  Arm  Legs lower and upper body  10:00 In room  Bed  Sleep   Moving all extremities  Arm  Legs lower and upper body  11:00 In room  Bed  Sleep  Moving all extremities  Arm  Legs lower and upper body

## 2019-02-20 NOTE — Progress Notes (Signed)
1:1 Continuous monitoring  1700 Patient in dayroom  Sitting at table . Appetite at dinner good  1800 Patient remained in dayroom  Sitting at table . Father called . Patient  Got up walked back to room  Noted improved  With mood  And gait  1900 Resting quietly in  Room voice no concerns  Able to move  Arms  And legs

## 2019-02-20 NOTE — Progress Notes (Addendum)
4-PM -7PM 1:1 Continuous Monitoring  4:00 In room Bed Awake Moving all extremities Arm Legs lower and upper body

## 2019-02-20 NOTE — Plan of Care (Signed)
Staff  help assist patient  with understanding information received . Emotional and mental status improving Limited  Involvement   With unit programing   Patient  Not attending  Unit programing . Staff continue to help with directing coping and  Decision  Making . Periods of sleeping during shift  Patient has 1:1  for safety needs    Problem: Education: Goal: Knowledge of [place] [facility] Education information/materials will improve Outcome: Progressing Goal: Emotional status will improve Outcome: Progressing Goal: Mental status will improve Outcome: Progressing Goal: Verbalization of understanding the information provided will improve Outcome: Progressing   Problem: Activity: Goal: Interest or engagement in activities will improve Outcome: Progressing Goal: Sleeping patterns will improve Outcome: Progressing   Problem: Coping: Goal: Ability to verbalize frustrations and anger appropriately will improve Outcome: Progressing Goal: Ability to demonstrate self-control will improve Outcome: Progressing   Problem: Health Behavior/Discharge Planning: Goal: Identification of resources available to assist in meeting health care needs will improve Outcome: Progressing Goal: Compliance with treatment plan for underlying cause of condition will improve Outcome: Progressing   Problem: Physical Regulation: Goal: Ability to maintain clinical measurements within normal limits will improve Outcome: Progressing   Problem: Safety: Goal: Periods of time without injury will increase Outcome: Progressing

## 2019-02-20 NOTE — [facility] Group Notes (Signed)
Feelings Around Diagnosis 02/20/2019 1PM  Type of Therapy/Topic:  Group Therapy:  Feelings about Diagnosis  Participation Level:  Did Not Attend   Description of Group:   This group will allow patients to explore their thoughts and feelings about diagnoses they have received. Patients will be guided to explore their level of understanding and acceptance of these diagnoses. Facilitator will encourage patients to process their thoughts and feelings about the reactions of others to their diagnosis and will guide patients in identifying ways to discuss their diagnosis with significant others in their lives. This group will be process-oriented, with patients participating in exploration of their own experiences, giving and receiving support, and processing challenge from other group members.   Therapeutic Goals: 1. Patient will demonstrate understanding of diagnosis as evidenced by identifying two or more symptoms of the disorder 2. Patient will be able to express two feelings regarding the diagnosis 3. Patient will demonstrate their ability to communicate their needs through discussion and/or role play  Summary of Patient Progress:       Therapeutic Modalities:   Cognitive Behavioral Therapy Brief Therapy Feelings Identification    Yvette Rack, LCSW 02/20/2019 2:43 PM

## 2019-02-20 NOTE — Progress Notes (Signed)
12-PM -3PM  1:1 Continuous Monitoring 11:00 Patient  Ambulated  To dayroom  For lunch  Appetite good  Patient remained  In dayroom  To 12:30  12:00 Patient remains in dayroom  With peers  sitting in chair 13:00 In room  Bed  Sleep   Moving all extremities  Arm  Legs lower and upper body  14:00 In room  Bed  Sleep   Moving all extremities  Arm  Legs lower and upper body 15:00  In room  Bed Awake   Moving all extremities  Arm  Legs lower and upper body

## 2019-02-21 NOTE — Progress Notes (Signed)
1:1 Continous  Monitoring  7 am  -  11am 7:45Communitiy room for breakfast appetite good  Affect blunted  8:00 Sleeping in bed able to move extremities  Arms and legs  Turned himself over  0900 Sleeping in bed able to move extremities  Arms and legs  Turned himself over  10:00 Awake  in bed able to move extremities  Arms and legs  Turned himself over  11:00  Awake in bed able to move extremities  Arms and legs  Turned himself over

## 2019-02-21 NOTE — Progress Notes (Signed)
Patient alert and oriented, in bed with eyes closed sitter at bedside no distress noted will continue to monitor.

## 2019-02-21 NOTE — Progress Notes (Signed)
Patient continues to be 1:1 affect is blunted, denies SI/HI/AVH no distress noted complaint with medication regimen, thoughts are organized and coherent will continue to monitor.

## 2019-02-21 NOTE — Progress Notes (Signed)
Recreation Therapy Notes  Date: 02/21/2019  Time: 9:30 am   Location: Craft room   Behavioral response: N/A   Intervention Topic: Self-esteem  Discussion/Intervention: Patient did not attend group.   Clinical Observations/Feedback:  Patient did not attend group.   [name] LRT/CTRS        Troy Fox 02/21/2019 10:49 AM 

## 2019-02-21 NOTE — Progress Notes (Signed)
Continuous Monitoring  1:1 1600 Communitiy room for dinner  appetite good  1700 Sleeping in bed able to move extremities  Arms and legs  Turned himself over  1700 Sleeping in bed able to move extremities  Arms and legs   able Turned himself over  1800 Awake  in bed able to move extremities  Arms and legs able   Turned himself over  19:00  Awake in bed able to move extremities  Arms and legs   able Turned himself over

## 2019-02-21 NOTE — [facility] Group Notes (Signed)
LCSW Group Therapy Note  02/21/2019 1:00 PM  Type of Therapy/Topic:  Group Therapy:  Emotion Regulation  Participation Level:  Did Not Attend   Description of Group:   The purpose of this group is to assist patients in learning to regulate negative emotions and experience positive emotions. Patients will be guided to discuss ways in which they have been vulnerable to their negative emotions. These vulnerabilities will be juxtaposed with experiences of positive emotions or situations, and patients will be challenged to use positive emotions to combat negative ones. Special emphasis will be placed on coping with negative emotions in conflict situations, and patients will process healthy conflict resolution skills.  Therapeutic Goals: 1. Patient will identify two positive emotions or experiences to reflect on in order to balance out negative emotions 2. Patient will label two or more emotions that they find the most difficult to experience 3. Patient will demonstrate positive conflict resolution skills through discussion and/or role plays  Summary of Patient Progress: X  Therapeutic Modalities:   Cognitive Behavioral Therapy Feelings Identification Dialectical Behavioral Therapy  Troy Fox, MSW, LCSW 02/21/2019 11:44 AM

## 2019-02-21 NOTE — Progress Notes (Signed)
[facility] MD Progress Note  02/21/2019 4:28 PM Troy Fox  MRN:  [id number] Subjective: Patient seen.  Follow-up for patient with schizo affective disorder or schizophrenia.  Patient did not receive ECT today.  I concluded that he was probably having more delirium generated by ECT at this point that he was having benefit.  Today there is some evidence that that is correct.  Having not gotten ECT the patient is more verbal and is eating his food more appropriately.  Starting to get up and ambulate more.  Still little interaction and limited speech but no acutely dangerous behavior.  Tolerating the olanzapine at night without complication Principal Problem: Schizoaffective disorder, depressive type ([place]) Diagnosis: Principal Problem:   Schizoaffective disorder, depressive type ([place]) Active Problems:   Catatonia   Mild malnutrition ([place])  Total Time spent with patient: 30 minutes  Past Psychiatric History: Patient has a history of chronic severe mental illness  Past Medical History:  Past Medical History:  Diagnosis Date  . Bipolar 1 disorder ([place])   . 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 GERD (gastroesophageal reflux disease)   . Nonverbal   . Schizoaffective disorder ([place])   . Tardive dyskinesia    History reviewed. No pertinent surgical history. Family History:  Family History  Adopted: Yes  Family history unknown: Yes   Family Psychiatric  History: None Social History:  Social History   Substance and Sexual Activity  Alcohol Use Never  . Frequency: Never     Social History   Substance and Sexual Activity  Drug Use Never    Social History   Socioeconomic History  . Marital status: Single    Spouse name: Not on file  . Number of children: Not on file  . Years of education: Not on file  . Highest education level: Not on file  Occupational History  . Not on file  Social Needs  . Financial resource strain: Not on file  . Food insecurity:    Worry: Not on file   Inability: Not on file  . Transportation needs:    Medical: Not on file    Non-medical: Not on file  Tobacco Use  . Smoking status: Never Smoker  . Smokeless tobacco: Never Used  Substance and Sexual Activity  . Alcohol use: Never    Frequency: Never  . Drug use: Never  . Sexual activity: Not Currently  Lifestyle  . Physical activity:    Days per week: Not on file    Minutes per session: Not on file  . Stress: Not on file  Relationships  . Social connections:    Talks on phone: Not on file    Gets together: Not on file    Attends religious service: Not on file    Active member of club or organization: Not on file    Attends meetings of clubs or organizations: Not on file    Relationship status: Not on file  Other Topics Concern  . Not on file  Social History Narrative  . Not on file   Additional Social History:    Pain Medications: please see mar Prescriptions: please see mar Over the Counter: please see mar History of alcohol / drug use?: No history of alcohol / drug abuse Longest period of sobriety (when/how long): NA                    Sleep: Negative  Appetite:  Negative  Current Medications: Current Facility-Administered Medications  Medication Dose Route Frequency Provider  Last Rate Last Dose  . acetaminophen (TYLENOL) tablet 650 mg  650 mg Oral Q6H PRN Clapacs, John T, MD      . alum & mag hydroxide-simeth (MAALOX/MYLANTA) 200-200-20 MG/5ML suspension 30 mL  30 mL Oral Q4H PRN Clapacs, John T, MD      . FLUoxetine (PROZAC) capsule 20 mg  20 mg Oral Daily Clapacs, Jackquline DenmarkJohn T, MD   20 mg at 02/21/19 0724  . magnesium hydroxide (MILK OF MAGNESIA) suspension 30 mL  30 mL Oral Daily PRN Clapacs, John T, MD      . OLANZapine zydis (ZYPREXA) disintegrating tablet 10 mg  10 mg Oral QHS Clapacs, Jackquline DenmarkJohn T, MD   10 mg at 02/20/19 2203  . ondansetron (ZOFRAN) injection 4 mg  4 mg Intravenous Once PRN Yves Dillarroll, Paul, MD      . ondansetron [facility](ZOFRAN) injection 4 mg  4 mg  Intravenous Once PRN Yevette EdwardsAdams, James G, MD      . pantoprazole (PROTONIX) EC tablet 40 mg  40 mg Oral Daily Clapacs, Jackquline DenmarkJohn T, MD   40 mg at 02/21/19 0724  . polyethylene glycol (MIRALAX / GLYCOLAX) packet 17 g  17 g Oral Daily Clapacs, Jackquline DenmarkJohn T, MD   17 g at 02/21/19 0724  . traZODone (DESYREL) tablet 200 mg  200 mg Oral QHS Malvin JohnsFarah, Brian, MD   200 mg at 02/20/19 2150  . vitamin B-12 (CYANOCOBALAMIN) tablet 1,000 mcg  1,000 mcg Oral Daily Clapacs, Jackquline DenmarkJohn T, MD   1,000 mcg at 02/21/19 16100724    Lab Results: No results found for this or any previous visit (from the past 48 hour(s)).  Blood Alcohol level:  Lab Results  Component Value Date   ETH <10 01/24/2019    Metabolic Disorder Labs: Lab Results  Component Value Date   HGBA1C 4.9 01/30/2019   MPG 93.93 01/30/2019   No results found for: PROLACTIN No results found for: CHOL, TRIG, HDL, CHOLHDL, VLDL, LDLCALC  Physical Findings: AIMS: Facial and Oral Movements Muscles of Facial Expression: None, normal Lips and Perioral Area: None, normal Jaw: None, normal Tongue: None, normal,Extremity Movements Upper (arms, wrists, hands, fingers): None, normal Lower (legs, knees, ankles, toes): None, normal, Trunk Movements Neck, shoulders, hips: None, normal, Overall Severity Severity of abnormal movements (highest score from questions above): None, normal Incapacitation due to abnormal movements: None, normal Patient's awareness of abnormal movements (rate only patient's report): No Awareness, Dental Status Current problems with teeth and/or dentures?: No Does patient usually wear dentures?: No  CIWA:    COWS:     Musculoskeletal: Strength & Muscle Tone: within normal limits Gait & Station: unsteady Patient leans: N/A  Psychiatric Specialty Exam: Physical Exam  Nursing note and vitals reviewed. Constitutional: He appears well-developed and well-nourished.  HENT:  Head: Normocephalic and atraumatic.  Eyes: Pupils are equal, round, and  reactive to light. Conjunctivae are normal.  Neck: Normal range of motion.  Cardiovascular: Regular rhythm and normal heart sounds.  Respiratory: Effort normal. No respiratory distress.  GI: Soft.  Musculoskeletal: Normal range of motion.  Neurological: He is alert.  Skin: Skin is warm and dry.  Psychiatric: His affect is blunt. His speech is delayed. He is slowed and withdrawn. Cognition and memory are impaired. He expresses no homicidal and no suicidal ideation.    Review of Systems  Constitutional: Negative.   HENT: Negative.   Eyes: Negative.   Respiratory: Negative.   Cardiovascular: Negative.   Gastrointestinal: Negative.   Musculoskeletal: Negative.   Skin: Negative.  Neurological: Negative.   Psychiatric/Behavioral: Negative.     Blood pressure (!) 96/54, pulse (!) 57, temperature 97.8 F (36.6 C), resp. rate 16, height 5\' 5"  (1.651 m), weight 62.4 kg, SpO2 96 %.Body mass index is 22.88 kg/m.  General Appearance: Disheveled  Eye Contact:  Fair  Speech:  Garbled and Slow  Volume:  Decreased  Mood:  Dysphoric  Affect:  Flat  Thought Process:  Disorganized  Orientation:  Negative  Thought Content:  Illogical, Rumination and Tangential  Suicidal Thoughts:  No  Homicidal Thoughts:  No  Memory:  Immediate;   Fair Recent;   Poor Remote;   Poor  Judgement:  Impaired  Insight:  Lacking  Psychomotor Activity:  Decreased  Concentration:  Concentration: Poor  Recall:  Poor  Fund of Knowledge:  Poor  Language:  Poor  Akathisia:  No  Handed:  Right  AIMS (if indicated):     Assets:  Desire for Improvement Financial Resources/Insurance  ADL's:  Impaired  Cognition:  Impaired,  Moderate and Severe  Sleep:  Number of Hours: 7.15     Treatment Plan Summary: Daily contact with patient to assess and evaluate symptoms and progress in treatment, Medication management and Plan At this point we will hold off on ECT for the moment.  It is reported in the past that  maintenance treatment seem to be providing some benefit so we may consider getting onto a maintenance schedule in another week or 2 but for now encourage his continued recovery physically and mentally.  No change to antipsychotic.  Spoke with treatment team today and we will try to reach out to his family to start looking into discharge options.  Mordecai RasmussenJohn Clapacs, MD 02/21/2019, 4:28 PM

## 2019-02-21 NOTE — Progress Notes (Signed)
Continuous Monitoring 1:1 11:00  Patient  Remained  In  room for lunch appetite good  12:00 Sleeping in bed able to move extremities  Arms and legs  Turned himself over  1300 Sleeping in bed able to move extremities  Arms and legs  Turned himself over  1400 Awake  in bed able to move extremities  Arms and legs  Turned himself over  1500  Awake in bed able to move extremities  Arms and legs  Turned himself over

## 2019-02-21 NOTE — Plan of Care (Signed)
Staff  help assist patient  with understanding information received . Emotional and mental status improving Limited  Involvement   With unit programing   Patient  Not attending  Unit programing . Staff continue to help with directing coping and  Decision  Making . Periods of sleeping during shift  Patient has 1:1  for safety needs     Problem: Education: Goal: Knowledge of [place] Education information/materials will improve 02/21/2019 1139 by Leodis Liverpool, RN Outcome: Progressing 02/21/2019 1130 by Leodis Liverpool, RN Outcome: Progressing Goal: Emotional status will improve 02/21/2019 1139 by Leodis Liverpool, RN Outcome: Progressing 02/21/2019 1130 by Leodis Liverpool, RN Outcome: Progressing Goal: Mental status will improve 02/21/2019 1139 by Leodis Liverpool, RN Outcome: Progressing 02/21/2019 1130 by Leodis Liverpool, RN Outcome: Progressing Goal: Verbalization of understanding the information provided will improve 02/21/2019 1139 by Leodis Liverpool, RN Outcome: Progressing 02/21/2019 1130 by Leodis Liverpool, RN Outcome: Progressing   Problem: Activity: Goal: Interest or engagement in activities will improve 02/21/2019 1139 by Leodis Liverpool, RN Outcome: Progressing 02/21/2019 1130 by Leodis Liverpool, RN Outcome: Progressing Goal: Sleeping patterns will improve 02/21/2019 1139 by Leodis Liverpool, RN Outcome: Progressing 02/21/2019 1130 by Leodis Liverpool, RN Outcome: Progressing

## 2019-02-22 MED ORDER — FLUOXETINE HCL 20 MG PO CAPS
40.0000 mg | ORAL_CAPSULE | Freq: Every day | ORAL | Status: DC
  Administered 2019-02-23 – 2019-03-05 (×11): 40 mg via ORAL
  Filled 2019-02-22 (×11): qty 2

## 2019-02-22 NOTE — Progress Notes (Signed)
1:1 Continous  Monitoring  7 am  -  11am 7:45 Patient  Room  for breakfast appetite good  Affect cheerful   8:00 Sleeping in bed able to move extremities  Arms and legs  Turned himself over  0900  Sleeping in bed able to move extremities  Arms and legs  Turned himself over  10:00 Sleeping   in bed able to move extremities  Arms and legs  Turned himself over  11:00 Sleeping  in bed able to move extremities  Arms and legs  Turned himself over

## 2019-02-22 NOTE — B[facility] Group Notes (Signed)
Balance In Life 02/22/2019 1PM  Type of Therapy/Topic:  Group Therapy:  Balance in Life  Participation Level:  Did Not Attend  Description of Group:   This group will address the concept of balance and how it feels and looks when one is unbalanced. Patients will be encouraged to process areas in their lives that are out of balance and identify reasons for remaining unbalanced. Facilitators will guide patients in utilizing problem-solving interventions to address and correct the stressor making their life unbalanced. Understanding and applying boundaries will be explored and addressed for obtaining and maintaining a balanced life. Patients will be encouraged to explore ways to assertively make their unbalanced needs known to significant others in their lives, using other group members and facilitator for support and feedback.  Therapeutic Goals: 1. Patient will identify two or more emotions or situations they have that consume much of in their lives. 2. Patient will identify signs/triggers that life has become out of balance:  3. Patient will identify two ways to set boundaries in order to achieve balance in their lives:  4. Patient will demonstrate ability to communicate their needs through discussion and/or role plays  Summary of Patient Progress:    Therapeutic Modalities:   Cognitive Behavioral Therapy Solution-Focused Therapy Assertiveness Training  Dakiyah Heinke Lynelle Smoke, LCSW

## 2019-02-22 NOTE — Progress Notes (Signed)
Recreation Therapy Notes  Date: 02/22/2019  Time: 9:30 am   Location: Craft room   Behavioral response: N/A   Intervention Topic: Values  Discussion/Intervention: Patient did not attend group.   Clinical Observations/Feedback:  Patient did not attend group.   Trinia Georgi LRT/CTRS        Corneilus Heggie 02/22/2019 11:15 AM

## 2019-02-22 NOTE — BHH Counselor (Signed)
CSW and physician(J. Clapacs) participated in conference call at 240pm with pt's father(Herbert Lotts) to discuss discharge plan. Dr. Weber Cooks informed everyone he has stopped the pt's ECT at this time due to it causing the pt more confusion. He reports since he stopped ECT the pt is more alert, eating and communicative. Mr. Hollings reports he would like the pt to remain at home and voiced concern about pt being high fall risk at night. Father request referral for respite or adult daycare program for the pt. CSW informed him she will look into resources in [place] and other surrounding areas that pt may qualify for.

## 2019-02-22 NOTE — Progress Notes (Signed)
1:1 Continous  Monitoring  12 am  - 3 pm  11:00 Sleeping in bed able to move extremities  Arms and legs  Turned himself over  11:25 Ambulating  To dayroom . Gait remain unsteady  Appetite good at lunch  Hurried  Back to room  12:00  Resting  Quietly in bed able to move extremities  Arms and legs  Turned himself over  1300  Eyes  Close but not sleep   in bed able to move extremities  Arms and legs  Turned himself over  13:45  Awake  In bed  Eyes close   able to move extremities  Arms and legs  Turned himself over  1400 Resting  Quietly in bed able to move extremities  Arms and legs  Turned himself over  1500 Resting  Quietly in bed able to move extremities  Arms and legs  Turned himself over

## 2019-02-22 NOTE — Progress Notes (Signed)
D- Patient arouses easily and cooperates with plan of care. Affect/mood-continues pleasant.. Denies pain.. Able to ambulate to bathroom without assistance. A- Scheduled medications administered to patient, per MD orders. Support and encouragement provided.  Routine safety checks conducted every 15 minutes.  Patient informed to notify staff with problems or concerns. Continues with 1:1 care. R- No adverse drug reactions noted. Patient contracts for safety at this time. Patient compliant with medications and treatment plan. Patient receptive, calm, and cooperative.  Patient remains safe at this time.

## 2019-02-22 NOTE — Plan of Care (Signed)
Patient observed to make eye contact, follow simple commands and interact with short statements. Continues with1:1 monitoring. Able to go to bathroom without assistance. Will continue to monitor and adjust treatment as ordered /indicated. Problem: Education: Goal: Emotional status will improve Outcome: Progressing   Problem: Education: Goal: Mental status will improve Outcome: Progressing   Problem: Coping: Goal: Ability to verbalize frustrations and anger appropriately will improve Outcome: Progressing   Problem: Activity: Goal: Interest or engagement in activities will improve Outcome: Not Progressing

## 2019-02-22 NOTE — Progress Notes (Signed)
[facility]  02/22/2019 3:48 PM Jetty DuhamelStephen Moffet  MRN:  [id number] Subjective: Patient seen and chart reviewed.  Patient with schizoaffective disorder who showed response to ECT coming out of his catatonia.  We have held off on ECT now because he seemed to be getting delirious.  On interview today the patient was awake and able to answer questions.  He still mostly only answers with 1 word at a time but at least was able to give some appropriate answers.  He is eating better and able to ambulate to the restroom.  Still does not interact with others on the unit much at all.  Social worker and I spoke with the patient's father on the telephone today about discharge planning and various options that would be available. Principal Problem: Schizoaffective disorder, depressive type ([facility]) Diagnosis: Principal Problem:   Schizoaffective disorder, depressive type ([facility]) Active Problems:   Catatonia   Mild malnutrition ([facility])  Total Time spent with patient: 30 minutes  Past Psychiatric History: Patient has a longstanding history of schizoaffective disorder  Past Medical History:  Past Medical History:  Diagnosis Date  . Bipolar 1 disorder ([facility])   . 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 GERD (gastroesophageal reflux disease)   . Nonverbal   . Schizoaffective disorder ([facility])   . Tardive dyskinesia    History reviewed. No pertinent surgical history. Family History:  Family History  Adopted: Yes  Family history unknown: Yes   Family Psychiatric  History: None Social History:  Social History   Substance and Sexual Activity  Alcohol Use Never  . Frequency: Never     Social History   Substance and Sexual Activity  Drug Use Never    Social History   Socioeconomic History  . Marital status: Single    Spouse name: Not on file  . Number of children: Not on file  . Years of education: Not on file  . Highest education level: Not on file  Occupational History  . Not on file  Social Needs   . Financial resource strain: Not on file  . Food insecurity    Worry: Not on file    Inability: Not on file  . Transportation needs    Medical: Not on file    Non-medical: Not on file  Tobacco Use  . Smoking status: Never Smoker  . Smokeless tobacco: Never Used  Substance and Sexual Activity  . Alcohol use: Never    Frequency: Never  . Drug use: Never  . Sexual activity: Not Currently  Lifestyle  . Physical activity    Days per week: Not on file    Minutes per session: Not on file  . Stress: Not on file  Relationships  . Social Musicianconnections    Talks on phone: Not on file    Gets together: Not on file    Attends religious service: Not on file    Active member of club or organization: Not on file    Attends meetings of clubs or organizations: Not on file    Relationship status: Not on file  Other Topics Concern  . Not on file  Social History Narrative  . Not on file   Additional Social History:    Pain Medications: please see mar Prescriptions: please see mar Over the Counter: please see mar History of alcohol / drug use?: No history of alcohol / drug abuse Longest period of sobriety (when/how long): NA  Sleep: Fair  Appetite:  Fair  Current Medications: Current Facility-Administered Medications  Medication Dose Route Frequency Provider Last Rate Last Dose  . acetaminophen (TYLENOL) tablet 650 mg  650 mg Oral Q6H PRN Clapacs, John T, MD      . alum & mag hydroxide-simeth (MAALOX/MYLANTA) 200-200-20 MG/5ML suspension 30 mL  30 mL Oral Q4H PRN Clapacs, Madie Reno, MD      . Derrill Memo ON 02/23/2019] FLUoxetine (PROZAC) capsule 40 mg  40 mg Oral Daily Clapacs, John T, MD      . magnesium hydroxide (MILK OF MAGNESIA) suspension 30 mL  30 mL Oral Daily PRN Clapacs, John T, MD      . OLANZapine zydis (ZYPREXA) disintegrating tablet 10 mg  10 mg Oral QHS Clapacs, John T, MD   10 mg at 02/22/19 0001  . ondansetron (ZOFRAN) injection 4 mg  4 mg Intravenous  Once PRN Alvin Critchley, MD      . ondansetron [facility]) injection 4 mg  4 mg Intravenous Once PRN Molli Barrows, MD      . pantoprazole (PROTONIX) EC tablet 40 mg  40 mg Oral Daily Clapacs, Madie Reno, MD   40 mg at 02/22/19 0827  . polyethylene glycol (MIRALAX / GLYCOLAX) packet 17 g  17 g Oral Daily Clapacs, Madie Reno, MD   17 g at 02/22/19 0827  . traZODone (DESYREL) tablet 200 mg  200 mg Oral QHS Johnn Hai, MD   200 mg at 02/20/19 2150  . vitamin B-12 (CYANOCOBALAMIN) tablet 1,000 mcg  1,000 mcg Oral Daily Clapacs, Madie Reno, MD   1,000 mcg at 02/22/19 0827    Lab Results: No results found for this or any previous visit (from the past 48 hour(s)).  Blood Alcohol level:  Lab Results  Component Value Date   ETH <10 97/10/6376    Metabolic Disorder Labs: Lab Results  Component Value Date   HGBA1C 4.9 01/30/2019   MPG 93.93 01/30/2019   No results found for: PROLACTIN No results found for: CHOL, TRIG, HDL, CHOLHDL, VLDL, LDLCALC  Physical Findings: AIMS: Facial and Oral Movements Muscles of Facial Expression: None, normal Lips and Perioral Area: None, normal Jaw: None, normal Tongue: None, normal,Extremity Movements Upper (arms, wrists, hands, fingers): None, normal Lower (legs, knees, ankles, toes): None, normal, Trunk Movements Neck, shoulders, hips: None, normal, Overall Severity Severity of abnormal movements (highest score from questions above): None, normal Incapacitation due to abnormal movements: None, normal Patient's awareness of abnormal movements (rate only patient's report): No Awareness, Dental Status Current problems with teeth and/or dentures?: No Does patient usually wear dentures?: No  CIWA:    COWS:     Musculoskeletal: Strength & Muscle Tone: atrophy Gait & Station: unsteady Patient leans: N/A  Psychiatric Specialty Exam: Physical Exam  Nursing note and vitals reviewed. Constitutional: He appears well-developed.  HENT:  Head: Normocephalic and  atraumatic.  Eyes: Pupils are equal, round, and reactive to light. Conjunctivae are normal.  Neck: Normal range of motion.  Cardiovascular: Regular rhythm and normal heart sounds.  Respiratory: Effort normal.  GI: Soft.  Musculoskeletal: Normal range of motion.  Neurological: He is alert.  Skin: Skin is warm and dry.  Psychiatric: His affect is blunt. His speech is delayed. He is slowed and withdrawn. Cognition and memory are impaired. He expresses no homicidal and no suicidal ideation.    Review of Systems  Constitutional: Negative.   HENT: Negative.   Eyes: Negative.   Respiratory: Negative.   Cardiovascular: Negative.  Gastrointestinal: Negative.   Musculoskeletal: Negative.   Skin: Negative.   Neurological: Negative.   Psychiatric/Behavioral: Negative for depression, hallucinations and suicidal ideas.    Blood pressure 101/61, pulse 68, temperature 98.8 F (37.1 C), temperature source Oral, resp. rate 18, height 5\' 5"  (1.651 m), weight 62.4 kg, SpO2 96 %.Body mass index is 22.88 kg/m.  General Appearance: Casual  Eye Contact:  Minimal  Speech:  Slow  Volume:  Decreased  Mood:  Euthymic  Affect:  Flat  Thought Process:  Coherent  Orientation:  Full (Time, Place, and Person)  Thought Content:  Illogical, Rumination and Tangential  Suicidal Thoughts:  No  Homicidal Thoughts:  No  Memory:  Immediate;   Fair Recent;   Poor Remote;   Poor  Judgement:  Impaired  Insight:  Shallow  Psychomotor Activity:  Decreased  Concentration:  Concentration: Poor  Recall:  Poor  Fund of Knowledge:  Poor  Language:  Poor  Akathisia:  No  Handed:  Right  AIMS (if indicated):     Assets:  Desire for Improvement Housing Resilience Social Support  ADL's:  Impaired  Cognition:  Impaired,  Moderate and Severe  Sleep:  Number of Hours: 7.5     Treatment Plan Summary: Daily contact with patient to assess and evaluate symptoms and progress in treatment, Medication management and  Plan Patient may be getting back closer to his baseline.  It is little hard to tell as he is really not able to articulate symptoms well.  I have increased his Prozac a little bit based on his father's report that the patient had become more anhedonia prior to his decline and the fact that the patient still has a very flat affect.  We spoke with the father about options for outpatient treatment and especially living situation.  Social work is going to continue to look into what will be available in the community.  I spent some time encouraging the patient to get up and get out of bed and interact at least a little bit to show us that he is functional enough that he might be able to consider going home soon.  We are still get a hold off ECT at this point.  Maintenance treatment could be part of the long-term profile.  Mordecai RasmussenJohn Clapacs, MD 02/22/2019, 3:48 PM

## 2019-02-23 ENCOUNTER — Encounter: Payer: Self-pay | Admitting: Anesthesiology

## 2019-02-23 NOTE — Progress Notes (Signed)
Recreation Therapy Notes   Date: 02/23/2019  Time: 9:30 am   Location: Craft room   Behavioral response: N/A   Intervention Topic: Problem Solving  Discussion/Intervention: Patient did not attend group.   Clinical Observations/Feedback:  Patient did not attend group.   [name] LRT/CTRS         Troy Fox 02/23/2019 10:57 AM 

## 2019-02-23 NOTE — Progress Notes (Signed)
Patient alert and oriented x 4 in bed, respiration even non labored, symmetrical chest expansion, sitter at bedside, no distress noted 15 minutes safety checks maintained will continue to monitor.

## 2019-02-23 NOTE — Progress Notes (Signed)
Patient is alert and oriented X 3, denies pain, denies SI, HI and AVH. Patient is ambulating,but unsteady on feet at times and able to hold some conversation. Patient ate breakfast, lunch and walked to medication room and received medications. Patient states," I am fine." When asked if patient slept well last night patient replied," It was good." Patient did become a little unsteady while walking back to room but did not fall due to 1:1 safety sitter present. 1:1 to continue and 15 minute rounding to continue.

## 2019-02-23 NOTE — Tx Team (Signed)
Interdisciplinary Treatment and Diagnostic Plan Update  02/23/2019 Time of Session: 830am Troy Fox MRN: [id number]  Principal Diagnosis: Schizoaffective disorder, depressive type [facility])  Secondary Diagnoses: Principal Problem:   Schizoaffective disorder, depressive type ([place]) Active Problems:   Catatonia   Mild malnutrition ([place])   Current Medications:  Current Facility-Administered Medications  Medication Dose Route Frequency Provider Last Rate Last Dose  . acetaminophen (TYLENOL) tablet 650 mg  650 mg Oral Q6H PRN Clapacs, John T, MD      . alum & mag hydroxide-simeth (MAALOX/MYLANTA) 200-200-20 MG/5ML suspension 30 mL  30 mL Oral Q4H PRN Clapacs, John T, MD      . FLUoxetine (PROZAC) capsule 40 mg  40 mg Oral Daily Clapacs, Madie Reno, MD   40 mg at 02/23/19 0829  . magnesium hydroxide (MILK OF MAGNESIA) suspension 30 mL  30 mL Oral Daily PRN Clapacs, John T, MD      . OLANZapine zydis (ZYPREXA) disintegrating tablet 10 mg  10 mg Oral QHS Clapacs, John T, MD   10 mg at 02/22/19 2200  . ondansetron (ZOFRAN) injection 4 mg  4 mg Intravenous Once PRN Alvin Critchley, MD      . ondansetron [facility]) injection 4 mg  4 mg Intravenous Once PRN Molli Barrows, MD      . pantoprazole (PROTONIX) EC tablet 40 mg  40 mg Oral Daily Clapacs, Madie Reno, MD   40 mg at 02/23/19 0829  . polyethylene glycol (MIRALAX / GLYCOLAX) packet 17 g  17 g Oral Daily Clapacs, John T, MD   17 g at 02/23/19 0830  . traZODone (DESYREL) tablet 200 mg  200 mg Oral QHS Johnn Hai, MD   200 mg at 02/22/19 2200  . vitamin B-12 (CYANOCOBALAMIN) tablet 1,000 mcg  1,000 mcg Oral Daily Clapacs, Madie Reno, MD   1,000 mcg at 02/23/19 3338   PTA Medications: Medications Prior to Admission  Medication Sig Dispense Refill Last Dose  . atenolol (TENORMIN) 25 MG tablet Take 1 tablet (25 mg total) by mouth daily. 90 tablet 0 02/18/2019 at Unknown time  . busPIRone (BUSPAR) 5 MG tablet Take 1 tablet (5 mg total) by mouth 2 (two) times  daily. (Patient taking differently: Take 5 mg by mouth 2 (two) times daily as needed. ) 60 tablet 0 02/18/2019 at Unknown time  . Cyanocobalamin (VITAMIN B 12) 500 MCG TABS Take 500 mcg by mouth daily. 90 tablet 0 02/18/2019 at Unknown time  . LORazepam (ATIVAN) 0.5 MG tablet Take 1 tablet (0.5 mg total) by mouth every 8 (eight) hours as needed for anxiety. 90 tablet 2 02/18/2019 at Unknown time  . omeprazole (PRILOSEC) 40 MG capsule Take 1 capsule (40 mg total) by mouth daily. 90 capsule 0 02/18/2019 at Unknown time  . paliperidone (INVEGA) 6 MG 24 hr tablet Take 1 tablet (6 mg total) by mouth daily. 90 tablet 0 02/18/2019 at Unknown time  . polyethylene glycol (MIRALAX) 17 g packet Take 17 g by mouth daily as needed for moderate constipation. 72 packet 0 02/18/2019 at Unknown time  . sertraline (ZOLOFT) 50 MG tablet Take 3 tablets (150 mg total) by mouth daily. 270 tablet 0 02/18/2019 at Unknown time  . traZODone (DESYREL) 50 MG tablet Take 1 tablet (50 mg total) by mouth at bedtime. 90 tablet 0 02/18/2019 at Unknown time  . Valbenazine Tosylate (INGREZZA) 40 & 80 MG CPPK Take 40 mg by mouth daily for 7 days, THEN 80 mg daily for 30 days. [place]  each 0 02/18/2019 at Unknown time  . benztropine (COGENTIN) 1 MG tablet Take 1 tablet (1 mg total) by mouth 2 (two) times daily for 7 days. 14 tablet 0     Patient Stressors: Financial difficulties Health problems  Patient Strengths: Motivation for treatment/growth Supportive family/friends  Treatment Modalities: Medication Management, Group therapy, Case management,  1 to 1 session with clinician, Psychoeducation, Recreational therapy.   Physician Treatment Plan for Primary Diagnosis: Schizoaffective disorder, depressive type ([place]) Long Term Goal(s): Improvement in symptoms so as ready for discharge Improvement in symptoms so as ready for discharge   Short Term Goals: Ability to verbalize feelings will improve Compliance with prescribed medications will  improve Ability to maintain clinical measurements within normal limits will improve  Medication Management: Evaluate patient's response, side effects, and tolerance of medication regimen.  Therapeutic Interventions: 1 to 1 sessions, Unit Group sessions and Medication administration.  Evaluation of Outcomes: Not Met  Physician Treatment Plan for Secondary Diagnosis: Principal Problem:   Schizoaffective disorder, depressive type ([place]) Active Problems:   Catatonia   Mild malnutrition ([place])  Long Term Goal(s): Improvement in symptoms so as ready for discharge Improvement in symptoms so as ready for discharge   Short Term Goals: Ability to verbalize feelings will improve Compliance with prescribed medications will improve Ability to maintain clinical measurements within normal limits will improve     Medication Management: Evaluate patient's response, side effects, and tolerance of medication regimen.  Therapeutic Interventions: 1 to 1 sessions, Unit Group sessions and Medication administration.  Evaluation of Outcomes: Not Met   RN Treatment Plan for Primary Diagnosis: Schizoaffective disorder, depressive type ([place]) Long Term Goal(s): Knowledge of disease and therapeutic regimen to maintain health will improve  Short Term Goals: Ability to identify and develop effective coping behaviors will improve and Compliance with prescribed medications will improve  Medication Management: RN will administer medications as ordered by provider, will assess and evaluate patient's response and provide education to patient for prescribed medication. RN will report any adverse and/or side effects to prescribing provider.  Therapeutic Interventions: 1 on 1 counseling sessions, Psychoeducation, Medication administration, Evaluate responses to treatment, Monitor vital signs and CBGs as ordered, Perform/monitor CIWA, COWS, AIMS and Fall Risk screenings as ordered, Perform wound care treatments as  ordered.  Evaluation of Outcomes: Not Met   LCSW Treatment Plan for Primary Diagnosis: Schizoaffective disorder, depressive type ([place]) Long Term Goal(s): Safe transition to appropriate next level of care at discharge, Engage patient in therapeutic group addressing interpersonal concerns.  Short Term Goals: Engage patient in aftercare planning with referrals and resources  Therapeutic Interventions: Assess for all discharge needs, 1 to 1 time with Social worker, Explore available resources and support systems, Assess for adequacy in community support network, Educate family and significant other(s) on suicide prevention, Complete Psychosocial Assessment, Interpersonal group therapy.  Evaluation of Outcomes: Not Met   Progress in Treatment: Attending groups: No. Participating in groups: No. Taking medication as prescribed: Yes. Toleration medication: Yes. Family/Significant other contact made: No, will contact:  when pt gives consent Patient understands diagnosis: No. Discussing patient identified problems/goals with staff: No. Medical problems stabilized or resolved: No. Denies suicidal/homicidal ideation: Yes Issues/concerns per patient self-inventory: No. Other: N/A  New problem(s) identified: No, Describe:  None reported  New Short Term/Long Term Goal(s):Pt requires assistance with walking and experiencing minimal verbal communication.   Patient Goals:  Pt unable to provide a goal at this time.  Discharge Plan or Barriers: Pt will resume outpatient ECT.  02/07/19-No change in progress. Will receive ECT today. 02/14/19-Pt will receive ECT today. Pt able to give one word responses to some questions, unable to sign  documentation to schedule follow up care at this time. Dr. Weber Cooks reports he will contact the pt's parents to see if he can return home in his current state and resume outpatient ECT. D/C plan TBD. 02/19/19- Pt is receiving ECT, still requires assistance with walking and  minimal verbal communication. D/C plan TBD.  UPDATE 02/23/2019:  Review of the patients chart indicates that ECT has been stopped at this time due to concerns that the patient was regressing.  Phone conference with father, primary [place] and psychiatrist was completed and father is requesting respite referral and/or possible referral to a day program.  CSW will follow up.  Reason for Continuation of Hospitalization: Medication stabilization, ECT  Estimated Length of Stay: TBD  Recreational Therapy: Patient Stressors: N/A  Patient Goal: Patient will engage in groups without prompting or encouragement from LRT x3 group sessions within 5 recreation therapy group sessions  Attendees: Patient: 02/23/2019 12:40 PM  Physician: Alethia Berthold 02/23/2019 12:40 PM  Nursing:  02/23/2019 12:40 PM  RN Care Manager: 02/23/2019 12:40 PM  Social Worker:   Assunta Curtis, [place] 02/23/2019 12:40 PM  Recreational Therapist:  02/23/2019 12:40 PM  Other:  02/23/2019 12:40 PM  Other:  02/23/2019 12:40 PM  Other: 02/23/2019 12:40 PM    Scribe for Treatment Team: Rozann Lesches, LCSW 02/23/2019 12:40 PM

## 2019-02-23 NOTE — Progress Notes (Signed)
Patient in room with CNA, resting in bed, eyes closed, breaths even and unlabored. No distress noted.1:1 to continue for safety.

## 2019-02-23 NOTE — Progress Notes (Signed)
Patient continues to be on 1:1 no distress noted , he was compliant with medication and receptive to staff, his thoughts are organized and coherent , ambulates with supervision to the bathroom , denies SI/HI/AVH, will continue to monitor.

## 2019-02-23 NOTE — [facility] Group Notes (Signed)

## 2019-02-23 NOTE — Anesthesia Postprocedure Evaluation (Signed)
Anesthesia Post Note  Patient: Troy Fox  Procedure(s) Performed: ECT TX  Patient location during evaluation: PACU Anesthesia Type: General Level of consciousness: awake and alert Pain management: pain level controlled Vital Signs Assessment: post-procedure vital signs reviewed and stable Respiratory status: spontaneous breathing, nonlabored ventilation, respiratory function stable and patient connected to nasal cannula oxygen Cardiovascular status: blood pressure returned to baseline and stable Postop Assessment: no apparent nausea or vomiting Anesthetic complications: no     Last Vitals:  Vitals:   02/22/19 0629 02/23/19 0652  BP: 101/61 (!) 97/57  Pulse: 68 86  Resp: 18 16  Temp: 37.1 C 36.7 C  SpO2: 96% 100%    Last Pain:  Vitals:   02/22/19 2000  TempSrc:   PainSc: 0-No pain                 Molli Barrows

## 2019-02-23 NOTE — Progress Notes (Signed)
[facility]  02/23/2019 3:52 PM Troy DuhamelStephen Fox  MRN:  [id number] Subjective: Follow-up for this patient with schizoaffective disorder.  Patient has not had ECT since Monday and is continuing to show what appears to be recovery of her overall mental functioning.  He has been able to ambulate with minimal assistance.  He is eating better.  He is able to hold very simple conversations and answer some questions appropriately.  I increased antidepressant medicine and antipsychotic medicine but he is tolerating those well without any obvious acute side effects. Principal Problem: Schizoaffective disorder, depressive type (HCC) Diagnosis: Principal Problem:   Schizoaffective disorder, depressive type (H[facility]) Active Problems:   Catatonia   Mild malnutrition ([facility])  Total Time spent with patient: 30 minutes  Past Psychiatric History: Patient has a long history of chronic mental illness.  Previous recovery with ECT and medication  Past Medical History:  Past Medical History:  Diagnosis Date  . Bipolar 1 disorder (HCC)   . 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 GERD (gastroesophageal reflux disease)   . Nonverbal   . Schizoaffective disorder (HCC)   . Tardive dyskinesia    History reviewed. No pertinent surgical history. Family History:  Family History  Adopted: Yes  Family history unknown: Yes   Family Psychiatric  History: None reported Social History:  Social History   Substance and Sexual Activity  Alcohol Use Never  . Frequency: Never     Social History   Substance and Sexual Activity  Drug Use Never    Social History   Socioeconomic History  . Marital status: Single    Spouse name: Not on file  . Number of children: Not on file  . Years of education: Not on file  . Highest education level: Not on file  Occupational History  . Not on file  Social Needs  . Financial resource strain: Not on file  . Food insecurity    Worry: Not on file    Inability: Not on file  .  Transportation needs    Medical: Not on file    Non-medical: Not on file  Tobacco Use  . Smoking status: Never Smoker  . Smokeless tobacco: Never Used  Substance and Sexual Activity  . Alcohol use: Never    Frequency: Never  . Drug use: Never  . Sexual activity: Not Currently  Lifestyle  . Physical activity    Days per week: Not on file    Minutes per session: Not on file  . Stress: Not on file  Relationships  . Social Musicianconnections    Talks on phone: Not on file    Gets together: Not on file    Attends religious service: Not on file    Active member of club or organization: Not on file    Attends meetings of clubs or organizations: Not on file    Relationship status: Not on file  Other Topics Concern  . Not on file  Social History Narrative  . Not on file   Additional Social History:    Pain Medications: please see mar Prescriptions: please see mar Over the Counter: please see mar History of alcohol / drug use?: No history of alcohol / drug abuse Longest period of sobriety (when/how long): NA                    Sleep: Fair  Appetite:  Fair  Current Medications: Current Facility-Administered Medications  Medication Dose Route Frequency Provider Last Rate Last Dose  .  acetaminophen (TYLENOL) tablet 650 mg  650 mg Oral Q6H PRN Clapacs, John T, MD      . alum & mag hydroxide-simeth (MAALOX/MYLANTA) 200-200-20 MG/5ML suspension 30 mL  30 mL Oral Q4H PRN Clapacs, John T, MD      . FLUoxetine (PROZAC) capsule 40 mg  40 mg Oral Daily Clapacs, Madie Reno, MD   40 mg at 02/23/19 0829  . magnesium hydroxide (MILK OF MAGNESIA) suspension 30 mL  30 mL Oral Daily PRN Clapacs, John T, MD      . OLANZapine zydis (ZYPREXA) disintegrating tablet 10 mg  10 mg Oral QHS Clapacs, John T, MD   10 mg at 02/22/19 2200  . ondansetron (ZOFRAN) injection 4 mg  4 mg Intravenous Once PRN Alvin Critchley, MD      . ondansetron [facility]) injection 4 mg  4 mg Intravenous Once PRN Molli Barrows, MD       . pantoprazole (PROTONIX) EC tablet 40 mg  40 mg Oral Daily Clapacs, Madie Reno, MD   40 mg at 02/23/19 0829  . polyethylene glycol (MIRALAX / GLYCOLAX) packet 17 g  17 g Oral Daily Clapacs, John T, MD   17 g at 02/23/19 0830  . traZODone (DESYREL) tablet 200 mg  200 mg Oral QHS Johnn Hai, MD   200 mg at 02/22/19 2200  . vitamin B-12 (CYANOCOBALAMIN) tablet 1,000 mcg  1,000 mcg Oral Daily Clapacs, Madie Reno, MD   1,000 mcg at 02/23/19 5809    Lab Results: No results found for this or any previous visit (from the past 48 hour(s)).  Blood Alcohol level:  Lab Results  Component Value Date   ETH <10 98/33/8250    Metabolic Disorder Labs: Lab Results  Component Value Date   HGBA1C 4.9 01/30/2019   MPG 93.93 01/30/2019   No results found for: PROLACTIN No results found for: CHOL, TRIG, HDL, CHOLHDL, VLDL, LDLCALC  Physical Findings: AIMS: Facial and Oral Movements Muscles of Facial Expression: None, normal Lips and Perioral Area: None, normal Jaw: None, normal Tongue: None, normal,Extremity Movements Upper (arms, wrists, hands, fingers): None, normal Lower (legs, knees, ankles, toes): None, normal, Trunk Movements Neck, shoulders, hips: None, normal, Overall Severity Severity of abnormal movements (highest score from questions above): None, normal Incapacitation due to abnormal movements: None, normal Patient's awareness of abnormal movements (rate only patient's report): No Awareness, Dental Status Current problems with teeth and/or dentures?: No Does patient usually wear dentures?: No  CIWA:    COWS:     Musculoskeletal: Strength & Muscle Tone: within normal limits Gait & Station: normal Patient leans: N/A  Psychiatric Specialty Exam: Physical Exam  Nursing note and vitals reviewed. Constitutional: He appears well-developed and well-nourished.  HENT:  Head: Normocephalic and atraumatic.  Eyes: Pupils are equal, round, and reactive to light. Conjunctivae are normal.   Neck: Normal range of motion.  Cardiovascular: Regular rhythm and normal heart sounds.  Respiratory: Effort normal. No respiratory distress.  GI: Soft.  Musculoskeletal: Normal range of motion.  Neurological: He is alert.  Skin: Skin is warm and dry.  Psychiatric: His affect is blunt. His speech is delayed. He is slowed. Cognition and memory are impaired. He expresses no homicidal and no suicidal ideation.    Review of Systems  Constitutional: Negative.   HENT: Negative.   Eyes: Negative.   Respiratory: Negative.   Cardiovascular: Negative.   Gastrointestinal: Negative.   Musculoskeletal: Negative.   Skin: Negative.   Neurological: Negative.   Psychiatric/Behavioral: Negative  for depression, hallucinations, memory loss, substance abuse and suicidal ideas. The patient is not nervous/anxious and does not have insomnia.     Blood pressure (!) 97/57, pulse 86, temperature 98 F (36.7 C), resp. rate 16, height 5\' 5"  (1.651 m), weight 62.4 kg, SpO2 100 %.Body mass index is 22.88 kg/m.  General Appearance: Disheveled  Eye Contact:  Fair  Speech:  Slow  Volume:  Decreased  Mood:  Dysphoric  Affect:  Congruent  Thought Process:  Disorganized  Orientation:  Full (Time, Place, and Person)  Thought Content:  Illogical  Suicidal Thoughts:  No  Homicidal Thoughts:  No  Memory:  Immediate;   Poor Recent;   Poor Remote;   Poor  Judgement:  Impaired  Insight:  Lacking  Psychomotor Activity:  Decreased  Concentration:  Concentration: Poor  Recall:  Poor  Fund of Knowledge:  Poor  Language:  Poor  Akathisia:  No  Handed:  Right  AIMS (if indicated):     Assets:  Desire for Improvement Housing Resilience Social Support  ADL's:  Impaired  Cognition:  Impaired,  Moderate  Sleep:  Number of Hours: 7.5     Treatment Plan Summary: Daily contact with patient to assess and evaluate symptoms and progress in treatment, Medication management and Plan Patient improved with ECT but was  getting some degree of delirium.  Now recovering.  Tolerating medicine well.  Not showing signs of acute psychosis.  Mood seems to be recovering.  Energy level improving.  We spoke with his father yesterday and are going to work on finding what sort of resources could be available to allow for disposition home.  No change in medication today.  Possible discharge planning as early as next week.  I am reconsulting physical therapy.  Mordecai RasmussenJohn Clapacs, MD 02/23/2019, 3:52 PM

## 2019-02-23 NOTE — Progress Notes (Signed)
Patient slept during the evening, did not want to get up to go to the dayroom. Patient given dinner. No distress noted. Patient continues to be on 1:1 for safety.

## 2019-02-24 NOTE — Progress Notes (Signed)
Patient is alert and oriented, denies SI, HI and AVH. Patient continues to be on 1:1 for safety and High fall risk. Patient affect is flat, briefly answers questions. Patient eating more thank previous days. Patient evaluated today by Physical therapy. Patient denies pain. Patient verbalized understanding of being more ambulatory on the unit through out the day.

## 2019-02-24 NOTE — Progress Notes (Signed)
4 pm- 5pm: Patient walked to the dinning area to eat dinner. Patient ate dinner with peers. After dinner patient walked back to room and laid down in bed awake, CNA within reach.  5 PM-6 PM: Patient laying down in bed; eyes closed; breaths even and unlabored. CNA within reach. No distressed noted.  6PM-7 PM: Patient laying down in bed; eyes closed, breaths even and unlabored; no distress noted; CNA with in reach. Safety maintained during 55M-7PM shift.

## 2019-02-24 NOTE — Evaluation (Signed)
Physical Therapy Evaluation Patient Details Name: Troy Fox MRN: [id number] DOB: 11-01-1970 Today's Date: 02/24/2019   History of Present Illness  Troy Fox comes to Troy Fox direct admit to behavior health unit, has been undergoing treatment for catatonia/schizophrenia, recently more mobility and verbal than PTA. Pt comes from home, cared for by parents who are no longer able to care for patient, and plan is for eventual placement into the community. PT called to evlauated for confditioning and strength needs in prep for DC.  Clinical Impression  Pt admitted with above diagnosis. Pt currently with functional limitations due to the deficits listed below (see "PT Problem List"). Upon entry, pt asleep in bed, easily made awake and agreeable to participate. Sitter in room. The pt is alert and oriented to self, answers a few questions about PLOF but without elaboration. Pt is generally drowsy, flat affect, and cooperative throughout, following verbal cues well, sometimes needs repeating for complex tasks or for visual cues. Bed mobility and transfers performed with minGuard assist d/t some ataxia, but despite contant instability, there are very few instances of true LOB. Pt does not rely on UE for correction. Even with testing of SLS balance he makes efforts through high amplitude hip righting strategies. Pt AMB ~46900ft with minGuard assist, most difficulty with turns, but limited ultimately by fatigue. Pt left in bed at EOS, encouraged nursing team to AMB pt 3-4x daily outside of ADL activity. Pt will benefit from skilled PT intervention to increase independence and safety with basic mobility in preparation for discharge to the venue listed below.      Follow Up Recommendations Home health PT    Equipment Recommendations  None recommended by PT    Recommendations for Other Services       Precautions / Restrictions Precautions Precautions: Fall Restrictions Weight Bearing Restrictions: No       Mobility  Bed Mobility Overal bed mobility: Independent                Transfers Overall transfer level: Modified independent   Transfers: Sit to/from Stand Sit to Stand: Modified independent (Device/Increase time)         General transfer comment: 5xSTS hands free 16.8sec, LOB on number 3  Ambulation/Gait Ambulation/Gait assistance: Min guard Gait Distance (Feet): 400 Feet Assistive device: None Gait Pattern/deviations: Narrow base of support;Drifts right/left     General Gait Details: most instability demonstrated during turns, but patient able to self correct most LOB without UE use.  Stairs            Wheelchair Mobility    Modified Rankin (Stroke Patients Only)       Balance Overall balance assessment: Needs assistance           Standing balance-Leahy Scale: Normal Standing balance comment: SLS>3sec bilat with increased large amplitude hip strategy.                             Pertinent Vitals/Pain Pain Assessment: No/denies pain    Home Living Family/patient expects to be discharged to:: Group home(per MD notes)                      Prior Function                 Hand Dominance        Extremity/Trunk Assessment   Upper Extremity Assessment Upper Extremity Assessment: Generalized weakness    Lower Extremity Assessment Lower  Extremity Assessment: Generalized weakness(5x STS: 16.5 sec)       Communication      Cognition Arousal/Alertness: Awake/alert(drowsy but remains awake) Behavior During Therapy: WFL for tasks assessed/performed Overall Cognitive Status: History of cognitive impairments - at baseline                                 General Comments: typically not verbal, but is minimally verbal this date, follows cues with mildly delayed processing.      General Comments      Exercises     Assessment/Plan    PT Assessment Patient needs continued PT services  PT  Problem List Decreased strength;Decreased activity tolerance;Decreased balance;Decreased mobility;Decreased cognition       PT Treatment Interventions Balance training;Gait training;Stair training;Functional mobility training;Therapeutic activities;Therapeutic exercise;Patient/family education;Neuromuscular re-education    PT Goals (Current goals can be found in the Care Plan section)  Acute Rehab PT Goals PT Goal Formulation: Patient unable to participate in goal setting Time For Goal Achievement: 03/10/19    Frequency Min 2X/week   Barriers to discharge Decreased caregiver support      Co-evaluation               AM-PAC PT "6 Clicks" Mobility  Outcome Measure Help needed turning from your back to your side while in a flat bed without using bedrails?: None Help needed moving from lying on your back to sitting on the side of a flat bed without using bedrails?: None Help needed moving to and from a bed to a chair (including a wheelchair)?: A Little Help needed standing up from a chair using your arms (e.g., wheelchair or bedside chair)?: A Little Help needed to walk in hospital room?: A Little Help needed climbing 3-5 steps with a railing? : A Little 6 Click Score: 20    End of Session Equipment Utilized During Treatment: Gait belt Activity Tolerance: Patient tolerated treatment well;Patient limited by fatigue;Other (comment)(declines additional AMB d/t fatigue) Patient left: in bed;with nursing/sitter in room Nurse Communication: Mobility status PT Visit Diagnosis: Unsteadiness on feet (R26.81);Other abnormalities of gait and mobility (R26.89)    Time: 8250-5397 PT Time Calculation (min) (ACUTE ONLY): 18 min   Charges:   PT Evaluation $PT Eval Low Complexity: 1 Low PT Treatments $Therapeutic Exercise: 8-22 mins        12:49 PM, 02/24/19 Troy Fox, PT, DPT Physical Therapist - [facility]  [phone] ([place])    Troy Fox,Troy Fox 02/24/2019, 12:39 PM

## 2019-02-24 NOTE — Progress Notes (Signed)
Patient continues to be on 1:1 no distress noted, in bed with eyes closed, respiration even non labored, 15 minutes safety checks maintained will  continue to monitor.

## 2019-02-24 NOTE — Progress Notes (Signed)
11am-12 pm: Patient walked to the dinning area to eat lunch, after lunch patient walked backed to his room. Nurse Tech with in arms reach.  1 pm-2 pm: Patient in room laying down in bed, eyes closed, breaths even and unlabored, no distress noted.  3 PM-4PM: Patient in room laying down in the bed, eyes closed breath even and unlabored, no distress noted.

## 2019-02-24 NOTE — Progress Notes (Signed)
[facility]  02/24/2019 8:50 AM Troy DuhamelStephen Fox  MRN:  [id number] Subjective: Patient is a 48 year old male with a past psychiatric significant for schizoaffective disorder; depression with catatonia.  Objective: Patient is seen and examined.  Patient is a 48 year old male with a past psychiatric history significant for schizoaffective disorder; depressive type as well as catatonia.  He is seen in follow-up.  Patient had been receiving ECT, but but had not had any since this previous Monday.  Previous notes suggest that he is slowly recovering and getting improvement of his overall mental function.  He has previously been able to ambulate with minimal assistance.  It was reported that he was eating better.  He continues on one-to-one supervision.  Today he is lying in the bed, and the sheet is pulled approximately halfway up his face.  He is reticent to answer questions completely.  He denied suicidal or homicidal ideation.  He denied any auditory or visual hallucinations.  His current medications include olanzapine 10 mg p.o. nightly, Protonix 40 mg p.o. daily, MiraLAX as needed and fluoxetine 40 mg p.o. daily which was started on 6/12.  His blood sugar this morning is 82.  His vital signs are stable, he is afebrile.  Principal Problem: Schizoaffective disorder, depressive type (HCC) Diagnosis: Principal Problem:   Schizoaffective disorder, depressive type (HCC) Active Problems:   Catatonia   Mild malnutrition (HCC)  Total Time spent with patient: 15 minutes  Past Psychiatric History: See admission H&P  Past Medical History:  Past Medical History:  Diagnosis Date  . Bipolar 1 disorder (HCC)   . 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 GERD (gastroesophageal reflux disease)   . Nonverbal   . Schizoaffective disorder (HCC)   . Tardive dyskinesia    History reviewed. No pertinent surgical history. Family History:  Family History  Adopted: Yes  Family history unknown: Yes   Family  Psychiatric  History: See admission H&P Social History:  Social History   Substance and Sexual Activity  Alcohol Use Never  . Frequency: Never     Social History   Substance and Sexual Activity  Drug Use Never    Social History   Socioeconomic History  . Marital status: Single    Spouse name: Not on file  . Number of children: Not on file  . Years of education: Not on file  . Highest education level: Not on file  Occupational History  . Not on file  Social Needs  . Financial resource strain: Not on file  . Food insecurity    Worry: Not on file    Inability: Not on file  . Transportation needs    Medical: Not on file    Non-medical: Not on file  Tobacco Use  . Smoking status: Never Smoker  . Smokeless tobacco: Never Used  Substance and Sexual Activity  . Alcohol use: Never    Frequency: Never  . Drug use: Never  . Sexual activity: Not Currently  Lifestyle  . Physical activity    Days per week: Not on file    Minutes per session: Not on file  . Stress: Not on file  Relationships  . Social Musicianconnections    Talks on phone: Not on file    Gets together: Not on file    Attends religious service: Not on file    Active member of club or organization: Not on file    Attends meetings of clubs or organizations: Not on file    Relationship status: Not  on file  Other Topics Concern  . Not on file  Social History Narrative  . Not on file   Additional Social History:    Pain Medications: please see mar Prescriptions: please see mar Over the Counter: please see mar History of alcohol / drug use?: No history of alcohol / drug abuse Longest period of sobriety (when/how long): NA                    Sleep: Fair  Appetite:  Fair  Current Medications: Current Facility-Administered Medications  Medication Dose Route Frequency Provider Last Rate Last Dose  . acetaminophen (TYLENOL) tablet 650 mg  650 mg Oral Q6H PRN Clapacs, John T, MD      . alum & mag  hydroxide-simeth (MAALOX/MYLANTA) 200-200-20 MG/5ML suspension 30 mL  30 mL Oral Q4H PRN Clapacs, John T, MD      . FLUoxetine (PROZAC) capsule 40 mg  40 mg Oral Daily Clapacs, Madie Reno, MD   40 mg at 02/24/19 0814  . magnesium hydroxide (MILK OF MAGNESIA) suspension 30 mL  30 mL Oral Daily PRN Clapacs, John T, MD      . OLANZapine zydis (ZYPREXA) disintegrating tablet 10 mg  10 mg Oral QHS Clapacs, Madie Reno, MD   10 mg at 02/23/19 2151  . ondansetron (ZOFRAN) injection 4 mg  4 mg Intravenous Once PRN Alvin Critchley, MD      . ondansetron [facility]) injection 4 mg  4 mg Intravenous Once PRN Molli Barrows, MD      . pantoprazole (PROTONIX) EC tablet 40 mg  40 mg Oral Daily Clapacs, Madie Reno, MD   40 mg at 02/24/19 0814  . polyethylene glycol (MIRALAX / GLYCOLAX) packet 17 g  17 g Oral Daily Clapacs, Madie Reno, MD   17 g at 02/24/19 0817  . traZODone (DESYREL) tablet 200 mg  200 mg Oral QHS Johnn Hai, MD   200 mg at 02/23/19 2151  . vitamin B-12 (CYANOCOBALAMIN) tablet 1,000 mcg  1,000 mcg Oral Daily Clapacs, Madie Reno, MD   1,000 mcg at 02/24/19 1308    Lab Results: No results found for this or any previous visit (from the past 48 hour(s)).  Blood Alcohol level:  Lab Results  Component Value Date   ETH <10 65/78/4696    Metabolic Disorder Labs: Lab Results  Component Value Date   HGBA1C 4.9 01/30/2019   MPG 93.93 01/30/2019   No results found for: PROLACTIN No results found for: CHOL, TRIG, HDL, CHOLHDL, VLDL, LDLCALC  Physical Findings: AIMS: Facial and Oral Movements Muscles of Facial Expression: None, normal Lips and Perioral Area: None, normal Jaw: None, normal Tongue: None, normal,Extremity Movements Upper (arms, wrists, hands, fingers): None, normal Lower (legs, knees, ankles, toes): None, normal, Trunk Movements Neck, shoulders, hips: None, normal, Overall Severity Severity of abnormal movements (highest score from questions above): None, normal Incapacitation due to abnormal  movements: None, normal Patient's awareness of abnormal movements (rate only patient's report): No Awareness, Dental Status Current problems with teeth and/or dentures?: No Does patient usually wear dentures?: No  CIWA:    COWS:     Musculoskeletal: Strength & Muscle Tone: decreased Gait & Station: unsteady Patient leans: N/A  Psychiatric Specialty Exam: Physical Exam  Nursing note and vitals reviewed. Constitutional: He is oriented to person, place, and time. He appears well-developed and well-nourished.  HENT:  Head: Normocephalic and atraumatic.  Respiratory: Effort normal.  Neurological: He is alert and oriented to person, place,  and time.    ROS  Blood pressure (!) 108/57, pulse 77, temperature 97.7 F (36.5 C), temperature source Oral, resp. rate 18, height 5\' 5"  (1.651 m), weight 62.4 kg, SpO2 98 %.Body mass index is 22.88 kg/m.  General Appearance: Casual  Eye Contact:  Minimal  Speech:  Slow  Volume:  Normal  Mood:  Depressed and Dysphoric  Affect:  Flat  Thought Process:  Coherent and Descriptions of Associations: Circumstantial  Orientation:  Full (Time, Place, and Person)  Thought Content:  Negative  Suicidal Thoughts:  No  Homicidal Thoughts:  No  Memory:  Immediate;   Fair Recent;   Fair Remote;   Fair  Judgement:  Impaired  Insight:  Lacking  Psychomotor Activity:  Decreased  Concentration:  Concentration: Fair and Attention Span: Fair  Recall:  FiservFair  Fund of Knowledge:  Fair  Language:  Fair  Akathisia:  Negative  Handed:  Right  AIMS (if indicated):     Assets:  Desire for Improvement Resilience  ADL's:  Intact  Cognition:  WNL  Sleep:  Number of Hours: 7.15     Treatment Plan Summary: Daily contact with patient to assess and evaluate symptoms and progress in treatment, Medication management and Plan : Patient is seen and examined.  Patient is a 48 year old male with the above-stated past psychiatric history who is seen in follow-up.    Diagnosis: #1 schizoaffective disorder; depressive type, #2 catatonia, #3 chronic constipation  Patient is seen and examined.  Patient is a 48 year old male with the above-stated past psychiatric history who is seen in follow-up.  Review of the chart shows that at least he is headed in the right direction.  He has had ECT, and his antidepressant medications were increased on 6/12.  My examination today is essentially unchanged from Dr. Toni Amendlapacs.  His vital signs are stable, his blood sugar is stable, and his sleep is adequate at 7.15 hours.  No change in his current medications at this point. 1.  Continue fluoxetine 40 mg p.o. daily for depression. 2.  Continue Zyprexa 10 mg p.o. nightly for psychosis and sleep. 3.  Continue Protonix 40 mg p.o. daily for GERD. 4.  Continue MiraLAX 17 g in 8 ounces of water daily for constipation. 5.  Continue trazodone 200 mg p.o. nightly for mood and sleep. 6.  Continue B12 thousand milligrams p.o. daily for nutritional supplementation. 7.  Disposition planning-in progress.  Antonieta PertGreg Lawson Clary, MD 02/24/2019, 8:50 AM

## 2019-02-24 NOTE — Plan of Care (Signed)
  Problem: Coping: Goal: Ability to demonstrate self-control will improve Outcome: Progressing  Patient verbalized some self control no outburst  towards staff

## 2019-02-24 NOTE — Progress Notes (Signed)
Patient alert and oriented x 4, affect is blunted no distress noted, interacting appropriately with staff, continues to be on 1:1, thoughts are organized and coherent, he appeared tired and sleepy. Patient was offered some emotional support this evening , he received calls from his father and he was able to communicate effectively with him. Patient denies SI/HI/AVH 15 minutes safety checks maintained will continue to monitor.

## 2019-02-24 NOTE — [facility] Counselor (Signed)
LCSW Group Therapy Note   02/24/2019 1:15pm   Type of Therapy and Topic:  Group Therapy:  Trust and Honesty  Participation Level:  Did Not Attend  Description of Group:    In this group patients will be asked to explore the value of being honest.  Patients will be guided to discuss their thoughts, feelings, and behaviors related to honesty and trusting in others. Patients will process together how trust and honesty relate to forming relationships with peers, family members, and self. Each patient will be challenged to identify and express feelings of being vulnerable. Patients will discuss reasons why people are dishonest and identify alternative outcomes if one was truthful (to self or others). This group will be process-oriented, with patients participating in exploration of their own experiences, giving and receiving support, and processing challenge from other group members.   Therapeutic Goals: 1. Patient will identify why honesty is important to relationships and how honesty overall affects relationships.  2. Patient will identify a situation where they lied or were lied too and the  feelings, thought process, and behaviors surrounding the situation 3. Patient will identify the meaning of being vulnerable, how that feels, and how that correlates to being honest with self and others. 4. Patient will identify situations where they could have told the truth, but instead lied and explain reasons of dishonesty.   Summary of Patient Progress Pt was invited to attend group but chose not to attend. CSW will continue to encourage pt to attend group throughout their admission.     Therapeutic Modalities:   Cognitive Behavioral Therapy Solution Focused Therapy Motivational Interviewing Brief Therapy  Ekansh Sherk  CUEBAS-COLON, LCSW 02/24/2019 3:58 PM  

## 2019-02-25 NOTE — Progress Notes (Signed)
1:1 Patient Hourly Rounding:  1000: Patient is in bed, laying down, with his assigned safety sitter present at bedside.  1400: Patient is in social work group with his assigned Air cabin crew present.  1800: Patient is in bed, with his assigned safety sitter present at bedside.

## 2019-02-25 NOTE — Plan of Care (Signed)
  Problem: Education: Goal: Knowledge of [facility] Education information/materials will improve Outcome: Progressing Goal: Emotional status will improve Outcome: Progressing Goal: Mental status will improve Outcome: Progressing Goal: Verbalization of understanding the information provided will improve Outcome: Progressing   D: Patient has been resting in bed. Alert and oriented to person and place. Disoriented to time. Denies SI, HI and AV hallucinations. Mood pleasant. Affect appropriate to circumstance. MHT within reach A: Continue 1:1 for safety due to unsteady gait. R: Safety maintained.

## 2019-02-25 NOTE — Progress Notes (Signed)
1:1 observation 1900-2300-Resting in bed. MHT within reach. Alert and oriented to person and place. Disoriented to time. Denies SI, HI and AV hallucinations. MHT within reach. Voices no complaints. Denies pain. Gait continues to be unsteady when ambulating. Safety maintained. [phone]-Resting in bed with eyes closed. MHT within reach. Safety maintained. 0400-0700-Resting in bed with eyes closed. Denies SI, HI and AVH. MHT within reach. Safety maintained.

## 2019-02-25 NOTE — [facility] Group Notes (Signed)
LCSW Group Therapy Note 02/25/2019 1:15pm  Type of Therapy and Topic: Group Therapy: Feelings Around Returning Home & Establishing a Supportive Framework and Supporting Oneself When Supports Not Available  Participation Level: Active  Description of Group:  Patients first processed thoughts and feelings about upcoming discharge. These included fears of upcoming changes, lack of change, new living environments, judgements and expectations from others and overall stigma of mental health issues. The group then discussed the definition of a supportive framework, what that looks and feels like, and how do to discern it from an unhealthy non-supportive network. The group identified different types of supports as well as what to do when your family/friends are less than helpful or unavailable  Therapeutic Goals  1. Patient will identify one healthy supportive network that they can use at discharge. 2. Patient will identify one factor of a supportive framework and how to tell it from an unhealthy network. 3. Patient able to identify one coping skill to use when they do not have positive supports from others. 4. Patient will demonstrate ability to communicate their needs through discussion and/or role plays.  Summary of Patient Progress:  The patient reported he feels "okay." Pt engaged during group session. As patients processed their anxiety about discharge and described healthy supports patient shared he is not ready to be discharge. Patient reported he does not know why he is not ready. He stated he did not have a support system.  Patients identified at least one self-care tool they were willing to use after discharge.   Therapeutic Modalities Cognitive Behavioral Therapy Motivational Interviewing   Kaysie Michelini  CUEBAS-COLON, LCSW 02/25/2019 11:41 AM

## 2019-02-25 NOTE — Plan of Care (Signed)
D- Patient alert and oriented. Patient presented in a pleasant mood on assessment talking more than he has in the past to this writer and reporting that he slept good last night and had no complaints or concerns to voice. Patient denied any signs/symptoms of depression and anxiety to this Probation officer. Patient also denied SI, HI, AVH, and pain at this time. Patient had no stated goals for today .  A- Scheduled medications administered to patient, per MD orders. Support and encouragement provided.  Routine safety checks conducted every 15 minutes.  Patient informed to notify staff with problems or concerns.  R- No adverse drug reactions noted. Patient contracts for safety at this time. Patient compliant with medications and treatment plan. Patient receptive, calm, and cooperative. Patient interacts well with others on the unit.  Patient remains safe at this time.  Problem: Education: Goal: Knowledge of [facility] Education information/materials will improve Outcome: Progressing Goal: Emotional status will improve Outcome: Progressing Goal: Mental status will improve Outcome: Progressing Goal: Verbalization of understanding the information provided will improve Outcome: Progressing   Problem: Activity: Goal: Interest or engagement in activities will improve Outcome: Progressing Goal: Sleeping patterns will improve Outcome: Progressing   Problem: Coping: Goal: Ability to verbalize frustrations and anger appropriately will improve Outcome: Progressing Goal: Ability to demonstrate self-control will improve Outcome: Progressing   Problem: Health Behavior/Discharge Planning: Goal: Identification of resources available to assist in meeting health care needs will improve Outcome: Progressing Goal: Compliance with treatment plan for underlying cause of condition will improve Outcome: Progressing   Problem: Physical Regulation: Goal: Ability to maintain clinical measurements within normal  limits will improve Outcome: Progressing   Problem: Safety: Goal: Periods of time without injury will increase Outcome: Progressing

## 2019-02-25 NOTE — Progress Notes (Signed)
[facility]  02/25/2019 9:25 AM Jetty DuhamelStephen Donn  MRN:  [id number] Subjective:  Patient is a 48 year old male with a past psychiatric significant for schizoaffective disorder; depression with catatonia.  Objective: Patient is seen and examined.  Patient is a 48 year old male with a past psychiatric history significant for schizoaffective disorder; depressive type as well as a history of catatonia.  He is seen in follow-up.  He is much better today than yesterday.  Yesterday he was limited on any vocal communication.  This morning he stated he feels better.  He stated he felt about an 8 out of 10 with 10 being normal.  He was alert and oriented x3 (realizing he was in the hospital but was unaware that it was [place]).  He denied any side effects of his current medications.  He is eating better.  The nursing tech stated that the patient may agreed to a shower later today, and he actually looked up over at staff member and smiled.  He denied any auditory or visual hallucinations.  He denied any suicidal or homicidal ideation.  His vital signs were stable, but he was mildly bradycardic with a rate of 55.  He did sleep 7.25 hours last night.  Principal Problem: Schizoaffective disorder, depressive type (HCC) Diagnosis: Principal Problem:   Schizoaffective disorder, depressive type (HCC) Active Problems:   Catatonia   Mild malnutrition (HCC)  Total Time spent with patient: 20 minutes  Past Psychiatric History: See admission H&P  Past Medical History:  Past Medical History:  Diagnosis Date  . Bipolar 1 disorder (HCC)   . 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 GERD (gastroesophageal reflux disease)   . Nonverbal   . Schizoaffective disorder (HCC)   . Tardive dyskinesia    History reviewed. No pertinent surgical history. Family History:  Family History  Adopted: Yes  Family history unknown: Yes   Family Psychiatric  History: See admission H&P Social History:  Social History   Substance  and Sexual Activity  Alcohol Use Never  . Frequency: Never     Social History   Substance and Sexual Activity  Drug Use Never    Social History   Socioeconomic History  . Marital status: Single    Spouse name: Not on file  . Number of children: Not on file  . Years of education: Not on file  . Highest education level: Not on file  Occupational History  . Not on file  Social Needs  . Financial resource strain: Not on file  . Food insecurity    Worry: Not on file    Inability: Not on file  . Transportation needs    Medical: Not on file    Non-medical: Not on file  Tobacco Use  . Smoking status: Never Smoker  . Smokeless tobacco: Never Used  Substance and Sexual Activity  . Alcohol use: Never    Frequency: Never  . Drug use: Never  . Sexual activity: Not Currently  Lifestyle  . Physical activity    Days per week: Not on file    Minutes per session: Not on file  . Stress: Not on file  Relationships  . Social Musicianconnections    Talks on phone: Not on file    Gets together: Not on file    Attends religious service: Not on file    Active member of club or organization: Not on file    Attends meetings of clubs or organizations: Not on file    Relationship status: Not on  file  Other Topics Concern  . Not on file  Social History Narrative  . Not on file   Additional Social History:    Pain Medications: please see mar Prescriptions: please see mar Over the Counter: please see mar History of alcohol / drug use?: No history of alcohol / drug abuse Longest period of sobriety (when/how long): NA                    Sleep: Good  Appetite:  Fair  Current Medications: Current Facility-Administered Medications  Medication Dose Route Frequency Provider Last Rate Last Dose  . acetaminophen (TYLENOL) tablet 650 mg  650 mg Oral Q6H PRN Clapacs, John T, MD      . alum & mag hydroxide-simeth (MAALOX/MYLANTA) 200-200-20 MG/5ML suspension 30 mL  30 mL Oral Q4H PRN  Clapacs, John T, MD      . FLUoxetine (PROZAC) capsule 40 mg  40 mg Oral Daily Clapacs, Madie Reno, MD   40 mg at 02/25/19 0837  . magnesium hydroxide (MILK OF MAGNESIA) suspension 30 mL  30 mL Oral Daily PRN Clapacs, John T, MD      . OLANZapine zydis (ZYPREXA) disintegrating tablet 10 mg  10 mg Oral QHS Clapacs, Madie Reno, MD   10 mg at 02/24/19 2136  . ondansetron (ZOFRAN) injection 4 mg  4 mg Intravenous Once PRN Alvin Critchley, MD      . ondansetron [facility]) injection 4 mg  4 mg Intravenous Once PRN Molli Barrows, MD      . pantoprazole (PROTONIX) EC tablet 40 mg  40 mg Oral Daily Clapacs, Madie Reno, MD   40 mg at 02/25/19 0837  . polyethylene glycol (MIRALAX / GLYCOLAX) packet 17 g  17 g Oral Daily Clapacs, Madie Reno, MD   17 g at 02/25/19 0837  . traZODone (DESYREL) tablet 200 mg  200 mg Oral QHS Johnn Hai, MD   200 mg at 02/24/19 2136  . vitamin B-12 (CYANOCOBALAMIN) tablet 1,000 mcg  1,000 mcg Oral Daily Clapacs, Madie Reno, MD   1,000 mcg at 02/25/19 5366    Lab Results: No results found for this or any previous visit (from the past 48 hour(s)).  Blood Alcohol level:  Lab Results  Component Value Date   ETH <10 44/11/4740    Metabolic Disorder Labs: Lab Results  Component Value Date   HGBA1C 4.9 01/30/2019   MPG 93.93 01/30/2019   No results found for: PROLACTIN No results found for: CHOL, TRIG, HDL, CHOLHDL, VLDL, LDLCALC  Physical Findings: AIMS: Facial and Oral Movements Muscles of Facial Expression: None, normal Lips and Perioral Area: None, normal Jaw: None, normal Tongue: None, normal,Extremity Movements Upper (arms, wrists, hands, fingers): None, normal Lower (legs, knees, ankles, toes): None, normal, Trunk Movements Neck, shoulders, hips: None, normal, Overall Severity Severity of abnormal movements (highest score from questions above): None, normal Incapacitation due to abnormal movements: None, normal Patient's awareness of abnormal movements (rate only patient's  report): No Awareness, Dental Status Current problems with teeth and/or dentures?: No Does patient usually wear dentures?: No  CIWA:    COWS:     Musculoskeletal: Strength & Muscle Tone: decreased Gait & Station: unsteady Patient leans: N/A  Psychiatric Specialty Exam: Physical Exam  Nursing note and vitals reviewed. Constitutional: He is oriented to person, place, and time. He appears well-developed and well-nourished.  HENT:  Head: Normocephalic and atraumatic.  Respiratory: Effort normal.  Neurological: He is alert and oriented to person, place, and  time.    ROS  Blood pressure (!) 105/45, pulse (!) 55, temperature 97.9 F (36.6 C), temperature source Oral, resp. rate 18, height 5\' 5"  (1.651 m), weight 62.4 kg, SpO2 95 %.Body mass index is 22.88 kg/m.  General Appearance: Disheveled  Eye Contact:  Minimal  Speech:  Normal Rate  Volume:  Decreased  Mood:  Euthymic  Affect:  Flat  Thought Process:  Coherent and Descriptions of Associations: Circumstantial  Orientation:  Full (Time, Place, and Person)  Thought Content:  Negative  Suicidal Thoughts:  No  Homicidal Thoughts:  No  Memory:  Immediate;   Fair Recent;   Fair Remote;   Fair  Judgement:  Intact  Insight:  Fair  Psychomotor Activity:  Psychomotor Retardation  Concentration:  Concentration: Fair and Attention Span: Fair  Recall:  FiservFair  Fund of Knowledge:  Fair  Language:  Fair  Akathisia:  Negative  Handed:  Right  AIMS (if indicated):     Assets:  Desire for Improvement Resilience  ADL's:  Impaired  Cognition:  WNL  Sleep:  Number of Hours: 7.25     Treatment Plan Summary: Daily contact with patient to assess and evaluate symptoms and progress in treatment, Medication management and Plan : Patient is seen and examined.  Patient is a 48 year old male with the above-stated past psychiatric history who is seen in follow-up.   Diagnosis: #1 schizoaffective disorder; depressive type, #2 catatonia, #3  chronic constipation  Patient has improved since yesterday.  He is more verbal.  He still does not initiate any conversation but answered questions more appropriately today.  He actually did smile towards the tech who is in his room when we discussed the possibility of taking a shower today.  His sleep is adequate, and although he is mildly bradycardic his vital signs are stable.  His previous EKG showed a sinus tachycardia from 5/19.  He is not on any antihypertensive medications or anything that I am aware of the directly slows the heart rate.  I will order an EKG for the a.m. just in case there is something going on, but I suspect it is probably just going to be sinus bradycardia.  No changes in his medications, and hopefully he will continue to improve. 1.  Continue fluoxetine 40 mg p.o. daily for depression. 2.  Continue Zyprexa 10 mg p.o. nightly for psychosis and sleep. 3.  Continue Protonix 40 mg p.o. daily for GERD. 4.  Continue MiraLAX 17 g in 8 ounces of water daily for constipation. 5.  Continue trazodone 200 mg p.o. nightly for mood and sleep. 6.  Continue B12 thousand milligrams p.o. daily for nutritional supplementation. 7.  EKG in a.m. 6/15.  This is secondary to bradycardia. 8.  Disposition planning-in progress.  Antonieta PertGreg Lawson Samaa Ueda, MD 02/25/2019, 9:25 AM

## 2019-02-26 NOTE — B[facility] Group Notes (Signed)
LCSW Group Therapy Note   02/26/2019 12:57 PM   Type of Therapy and Topic:  Group Therapy:  Overcoming Obstacles   Participation Level:  Did Not Attend   Description of Group:    In this group patients will be encouraged to explore what they see as obstacles to their own wellness and recovery. They will be guided to discuss their thoughts, feelings, and behaviors related to these obstacles. The group will process together ways to cope with barriers, with attention given to specific choices patients can make. Each patient will be challenged to identify changes they are motivated to make in order to overcome their obstacles. This group will be process-oriented, with patients participating in exploration of their own experiences as well as giving and receiving support and challenge from other group members.   Therapeutic Goals: 1. Patient will identify personal and current obstacles as they relate to admission. 2. Patient will identify barriers that currently interfere with their wellness or overcoming obstacles.  3. Patient will identify feelings, thought process and behaviors related to these barriers. 4. Patient will identify two changes they are willing to make to overcome these obstacles:      Summary of Patient Progress x     Therapeutic Modalities:   Cognitive Behavioral Therapy Solution Focused Therapy Motivational Interviewing Relapse Prevention Therapy  Evalina Field, MSW, LCSW Clinical Social Work 02/26/2019 12:57 PM

## 2019-02-26 NOTE — Progress Notes (Signed)
Physical Therapy Treatment Patient Details Name: Troy DuhamelStephen Fox MRN: [id number] DOB: March 24, 1971 Today's Date: 02/26/2019    History of Present Illness Troy DuhamelStephen Fox [facility] behavior health unit, has been undergoing treatment for catatonia/schizophrenia, recently more mobility and verbal than PTA. Pt comes from home, cared for by parents who are no longer able to care for patient, and plan is for eventual placement into the community. PT called to evlauated for confditioning and strength needs in prep for DC.    PT Comments    Pt demonstrating occasional mild loss of balance when ambulating (especially with turns) but pt able to self correct each time without any UE support.  Tolerated standing balance and B LE ex's well.  Will continue to focus on strengthening and balance to improve safety with functional mobility.    Follow Up Recommendations  Home health PT     Equipment Recommendations  None recommended by PT    Recommendations for Other Services       Precautions / Restrictions Precautions Precautions: Fall Restrictions Weight Bearing Restrictions: No    Mobility  Bed Mobility Overal bed mobility: Modified Independent(in/out of bed)                Transfers Overall transfer level: Modified independent   Transfers: Sit to/from Stand Sit to Stand: Modified independent (Device/Increase time)         General transfer comment: fairly strong transfers noted; steady  Ambulation/Gait Ambulation/Gait assistance: Supervision(close SBA) Gait Distance (Feet): (>300 feet) Assistive device: None Gait Pattern/deviations: Narrow base of support Gait velocity: mildly decreased   General Gait Details: occasional mild loss of balance (mostly noted during turns) but pt able to self correct without any UE use   Stairs             Wheelchair Mobility    Modified Rankin (Stroke Patients Only)       Balance Overall balance assessment: Needs  assistance Sitting-balance support: No upper extremity supported;Feet supported Sitting balance-Leahy Scale: Normal Sitting balance - Comments: steady sitting reaching outside BOS     Standing balance-Leahy Scale: Good Standing balance comment: steady standing reaching within BOS Single Leg Stance - Right Leg: 2 Single Leg Stance - Left Leg: 2     Rhomberg - Eyes Opened: 10(feet apart and feet together) Rhomberg - Eyes Closed: 10(feet apart and feet together)                Cognition Arousal/Alertness: Awake/alert Behavior During Therapy: WFL for tasks assessed/performed Overall Cognitive Status: No family/caregiver present to determine baseline cognitive functioning                                 General Comments: Intermittently verbalizing with PT      Exercises Total Joint Exercises Knee Flexion: AROM;Strengthening;Both;10 reps;Standing(B UE support for balance) Standing Hip Extension: AROM;Strengthening;Both;10 reps;Standing(B UE support for balance) General Exercises - Lower Extremity Hip ABduction/ADduction: AROM;Strengthening;Both;10 reps;Standing(B UE support for balance) Hip Flexion/Marching: AROM;Strengthening;Both;10 reps;Standing Heel Raises: AROM;Strengthening;Both;10 reps;Standing(B UE support for balance)    General Comments  Pt requiring some encouragement to participate.      Pertinent Vitals/Pain Pain Assessment: No/denies pain  Vitals (HR and O2 on room air) stable and WFL throughout treatment session.    Home Living                      Prior Function  PT Goals (current goals can now be found in the care plan section) Acute Rehab PT Goals PT Goal Formulation: With patient Time For Goal Achievement: 03/10/19 Progress towards PT goals: Progressing toward goals    Frequency    Min 2X/week      PT Plan Current plan remains appropriate    Co-evaluation              AM-PAC PT "6 Clicks"  Mobility   Outcome Measure  Help needed turning from your back to your side while in a flat bed without using bedrails?: None Help needed moving from lying on your back to sitting on the side of a flat bed without using bedrails?: None Help needed moving to and from a bed to a chair (including a wheelchair)?: None Help needed standing up from a chair using your arms (e.g., wheelchair or bedside chair)?: A Little Help needed to walk in hospital room?: A Little Help needed climbing 3-5 steps with a railing? : A Little 6 Click Score: 21    End of Session Equipment Utilized During Treatment: Gait belt Activity Tolerance: Patient tolerated treatment well Patient left: in bed;with nursing/sitter in room Nurse Communication: Mobility status PT Visit Diagnosis: Unsteadiness on feet (R26.81);Other abnormalities of gait and mobility (R26.89)     Time: 1050-1105 PT Time Calculation (min) (ACUTE ONLY): 15 min  Charges:  $Therapeutic Exercise: 8-22 mins                    Leitha Bleak, PT 02/26/19, 2:17 PM [phone]

## 2019-02-26 NOTE — Progress Notes (Signed)
Patient has been resting, sitter at bedside. Alert and oriented and able to express needs and concerns. Patient has no sign of distress. Has no concern so far. Received a phone call from a family member: Phone was taken to him in room. Patient remains cooperative and appears to be pleased by the phone call. Staff continue to provide support and encouragements. Safety precautions maintained.

## 2019-02-26 NOTE — Progress Notes (Signed)
1900-2300 Patient resting in bed. Isolative to room. Had snack. Ate 100 percent. Up to bathroom with MHT within reach. Denies SI,HI AVH. Alert and oriented to person and place. Safety maintained. 0000-0400  Resting in bed. Denies SI, HI and AVH. Up to bathroom with MHT within reach. Still somewhat unsteady. Safety maintained.  0400-0700 Patient resting in bed. Denies SI, HI, AVH. Voices no complaints. Dozing at intervals. MHT within reach. Safety maintained.

## 2019-02-26 NOTE — Plan of Care (Signed)
D- Patient alert and oriented. Patient presents in a pleasant mood on assessment stating that he slept "pretty good" last night and had no complaints or concerns to voice to this Probation officer. Patient denies any signs/symptoms of depression or anxiety. Patient also denies SI, HI, AVH, and pain at this time. Patient has no stated goals stating "not really".  A- Scheduled medications administered to patient, per MD orders. Support and encouragement provided.  Routine safety checks conducted every 15 minutes.  Patient informed to notify staff with problems or concerns.  R- No adverse drug reactions noted. Patient contracts for safety at this time. Patient compliant with medications and treatment plan. Patient receptive, calm, and cooperative. Patient interacts well with others on the unit.  Patient remains safe at this time.  Problem: Education: Goal: Knowledge of [place] [facility] Education information/materials will improve Outcome: Progressing Goal: Emotional status will improve Outcome: Progressing Goal: Mental status will improve Outcome: Progressing Goal: Verbalization of understanding the information provided will improve Outcome: Progressing   Problem: Activity: Goal: Interest or engagement in activities will improve Outcome: Progressing Goal: Sleeping patterns will improve Outcome: Progressing   Problem: Coping: Goal: Ability to verbalize frustrations and anger appropriately will improve Outcome: Progressing Goal: Ability to demonstrate self-control will improve Outcome: Progressing   Problem: Health Behavior/Discharge Planning: Goal: Identification of resources available to assist in meeting health care needs will improve Outcome: Progressing Goal: Compliance with treatment plan for underlying cause of condition will improve Outcome: Progressing   Problem: Physical Regulation: Goal: Ability to maintain clinical measurements within normal limits will improve Outcome:  Progressing   Problem: Safety: Goal: Periods of time without injury will increase Outcome: Progressing

## 2019-02-26 NOTE — Plan of Care (Signed)
  Problem: Education: Goal: Knowledge of [place] [facility] Education information/materials will improve Outcome: Progressing Goal: Emotional status will improve Outcome: Progressing Goal: Mental status will improve Outcome: Progressing Goal: Verbalization of understanding the information provided will improve Outcome: Progressing   D: Patient has been in bed. Denies SI, HI and AVH. Denies pain. Voices no complaint. A: Continue 1:1 for safety. R: Safety maintained.

## 2019-02-26 NOTE — Progress Notes (Signed)
1:1 Patient Hourly Rounding  1000: Patient is asleep, in bed, with his assigned safety sitter present at bedside.  1400: Patient is lying in bed, awake, with his assigned safety sitter at bedside.  1800: Patient is in bed, half sleep and half awake, with his assigned safety sitter present at bedside.

## 2019-02-26 NOTE — Progress Notes (Signed)
Recreation Therapy Notes  Date: 02/26/2019  Time: 9:30 am  Location: Craft room  Behavioral response: Appropriate  Intervention Topic: Necessities  Discussion/Intervention:  Group content on today was focused on necessities. The group defined necessities and how they determine their necessities. Individuals expressed how many necessities they have and if it changes from day to day. Patients described the difference between wants and needs. The group explained how they have overspent on wants in the past. Individuals described a reoccurring necessity for them. The intervention "What I need" helped patients differentiate between wants and needs.  Clinical Observations/Feedback:  Patient came to group late due to unknown reasons. Participant was focused on what peers and staff had to say about necessities. Individual was social with peers and staff while participating in the intervention. Rithwik Schmieg LRT/CTRS         Ameriah Lint 02/26/2019 10:27 AM 

## 2019-02-26 NOTE — Progress Notes (Signed)
Respite Care Facilities Contacted  [facility] [phone]- [place] only  [facility] [phone] - temporarily ceased operations at this time  [facility] Alfred - [phone]- [facility]  M-F 8:30 - 3:30 Lucianne Lei will transport the patient) Patient can call and do assessment by phone per [facility]

## 2019-02-26 NOTE — Progress Notes (Signed)
[facility] MD Progress Note  02/26/2019 4:30 PM Colbi Staubs  MRN:  [id number] Subjective: Patient seen and chart reviewed.  Patient who responded well to schizophrenia in terms of resolving his catatonia.  He is awake in bed when I came to see him.  Able to engage in conversation.  Answers questions appropriately.  Still does not get up very much although physical therapy was able to get him out of bed briefly.  Patient is denying any suicidal thoughts.  Denies hallucinations.  Denies psychotic symptoms. Principal Problem: Schizoaffective disorder, depressive type ([place]) Diagnosis: Principal Problem:   Schizoaffective disorder, depressive type ([place]) Active Problems:   Catatonia   Mild malnutrition (HCC)  Total Time spent with patient: 30 minutes  Past Psychiatric History: Past history of chronic psychotic disorder and cognitive delay.  Past Medical History:  Past Medical History:  Diagnosis Date  . Bipolar 1 disorder ([place])   . 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 GERD (gastroesophageal reflux disease)   . Nonverbal   . Schizoaffective disorder ([place])   . Tardive dyskinesia    History reviewed. No pertinent surgical history. Family History:  Family History  Adopted: Yes  Family history unknown: Yes   Family Psychiatric  History: See previous Social History:  Social History   Substance and Sexual Activity  Alcohol Use Never  . Frequency: Never     Social History   Substance and Sexual Activity  Drug Use Never    Social History   Socioeconomic History  . Marital status: Single    Spouse name: Not on file  . Number of children: Not on file  . Years of education: Not on file  . Highest education level: Not on file  Occupational History  . Not on file  Social Needs  . Financial resource strain: Not on file  . Food insecurity    Worry: Not on file    Inability: Not on file  . Transportation needs    Medical: Not on file    Non-medical: Not on file  Tobacco Use  . Smoking  status: Never Smoker  . Smokeless tobacco: Never Used  Substance and Sexual Activity  . Alcohol use: Never    Frequency: Never  . Drug use: Never  . Sexual activity: Not Currently  Lifestyle  . Physical activity    Days per week: Not on file    Minutes per session: Not on file  . Stress: Not on file  Relationships  . Social Herbalist on phone: Not on file    Gets together: Not on file    Attends religious service: Not on file    Active member of club or organization: Not on file    Attends meetings of clubs or organizations: Not on file    Relationship status: Not on file  Other Topics Concern  . Not on file  Social History Narrative  . Not on file   Additional Social History:    Pain Medications: please see mar Prescriptions: please see mar Over the Counter: please see mar History of alcohol / drug use?: No history of alcohol / drug abuse Longest period of sobriety (when/how long): NA                    Sleep: Fair  Appetite:  Fair  Current Medications: Current Facility-Administered Medications  Medication Dose Route Frequency Provider Last Rate Last Dose  . acetaminophen (TYLENOL) tablet 650 mg  650 mg Oral Q6H PRN Sophonie Goforth,  Jackquline DenmarkJohn T, MD      . alum & mag hydroxide-simeth (MAALOX/MYLANTA) 200-200-20 MG/5ML suspension 30 mL  30 mL Oral Q4H PRN Jeffrey Voth T, MD      . FLUoxetine (PROZAC) capsule 40 mg  40 mg Oral Daily Naw Lasala T, MD   40 mg at 02/26/19 0930  . magnesium hydroxide (MILK OF MAGNESIA) suspension 30 mL  30 mL Oral Daily PRN Zachary Nole T, MD      . OLANZapine zydis (ZYPREXA) disintegrating tablet 10 mg  10 mg Oral QHS Jalaine Riggenbach, Jackquline DenmarkJohn T, MD   10 mg at 02/25/19 2128  . pantoprazole (PROTONIX) EC tablet 40 mg  40 mg Oral Daily Chinaza Rooke T, MD   40 mg at 02/26/19 0930  . polyethylene glycol (MIRALAX / GLYCOLAX) packet 17 g  17 g Oral Daily Pocahontas Cohenour T, MD   17 g at 02/26/19 0930  . traZODone (DESYREL) tablet 200 mg  200 mg Oral  QHS Malvin JohnsFarah, Brian, MD   200 mg at 02/25/19 2128  . vitamin B-12 (CYANOCOBALAMIN) tablet 1,000 mcg  1,000 mcg Oral Daily Demetria Lightsey, Jackquline DenmarkJohn T, MD   1,000 mcg at 02/26/19 0930    Lab Results: No results found for this or any previous visit (from the past 48 hour(s)).  Blood Alcohol level:  Lab Results  Component Value Date   ETH <10 01/24/2019    Metabolic Disorder Labs: Lab Results  Component Value Date   HGBA1C 4.9 01/30/2019   MPG 93.93 01/30/2019   No results found for: PROLACTIN No results found for: CHOL, TRIG, HDL, CHOLHDL, VLDL, LDLCALC  Physical Findings: AIMS: Facial and Oral Movements Muscles of Facial Expression: None, normal Lips and Perioral Area: None, normal Jaw: None, normal Tongue: None, normal,Extremity Movements Upper (arms, wrists, hands, fingers): None, normal Lower (legs, knees, ankles, toes): None, normal, Trunk Movements Neck, shoulders, hips: None, normal, Overall Severity Severity of abnormal movements (highest score from questions above): None, normal Incapacitation due to abnormal movements: None, normal Patient's awareness of abnormal movements (rate only patient's report): No Awareness, Dental Status Current problems with teeth and/or dentures?: No Does patient usually wear dentures?: No  CIWA:    COWS:     Musculoskeletal: Strength & Muscle Tone: within normal limits Gait & Station: normal Patient leans: N/A  Psychiatric Specialty Exam: Physical Exam  Nursing note and vitals reviewed. Constitutional: He appears well-developed and well-nourished.  HENT:  Head: Normocephalic and atraumatic.  Eyes: Pupils are equal, round, and reactive to light. Conjunctivae are normal.  Neck: Normal range of motion.  Cardiovascular: Regular rhythm and normal heart sounds.  Respiratory: Effort normal. No respiratory distress.  GI: Soft.  Musculoskeletal: Normal range of motion.  Neurological: He is alert.  Skin: Skin is warm and dry.  Psychiatric: His  affect is blunt. His speech is delayed. He is slowed.    Review of Systems  Constitutional: Negative.   HENT: Negative.   Eyes: Negative.   Respiratory: Negative.   Cardiovascular: Negative.   Gastrointestinal: Negative.   Musculoskeletal: Negative.   Skin: Negative.   Neurological: Negative.   Psychiatric/Behavioral: Negative.     Blood pressure 107/90, pulse 100, temperature 98.3 F (36.8 C), temperature source Oral, resp. rate 18, height 5\' 5"  (1.651 m), weight 62.4 kg, SpO2 97 %.Body mass index is 22.88 kg/m.  General Appearance: Casual  Eye Contact:  Fair  Speech:  Slow  Volume:  Decreased  Mood:  Euthymic  Affect:  Flat  Thought Process:  Coherent  Orientation:  Full (Time, Place, and Person)  Thought Content:  Logical  Suicidal Thoughts:  No  Homicidal Thoughts:  No  Memory:  Immediate;   Fair Recent;   Fair Remote;   Fair  Judgement:  Fair  Insight:  Fair  Psychomotor Activity:  Decreased  Concentration:  Concentration: Fair  Recall:  FiservFair  Fund of Knowledge:  Fair  Language:  Fair  Akathisia:  No  Handed:  Right  AIMS (if indicated):     Assets:  Desire for Improvement Housing  ADL's:  Impaired  Cognition:  Impaired,  Moderate and Severe  Sleep:  Number of Hours: 3     Treatment Plan Summary: Daily contact with patient to assess and evaluate symptoms and progress in treatment, Medication management and Plan Patient is not reporting feeling depressed.  Does not look withdrawn or hopeless.  Not using any depressive language.  Not clear why he is still so low energy and less this remains his baseline.  Patient encouraged as always to get up out of bed and be more interactive.  I am not sure he still needs the sitter but we will readdress that tomorrow.  No change to medication.  We are working with his family on trying to come up with appropriately safe discharge plans  Mordecai RasmussenJohn Aidyn Sportsman, MD 02/26/2019, 4:30 PM

## 2019-02-27 MED ORDER — TRAZODONE HCL 100 MG PO TABS
100.0000 mg | ORAL_TABLET | Freq: Every day | ORAL | Status: DC
  Administered 2019-02-27 – 2019-03-04 (×6): 100 mg via ORAL
  Filled 2019-02-27 (×7): qty 1

## 2019-02-27 NOTE — Progress Notes (Signed)
Patient slept throughout the night and appeared to be comfortable in bed. Did not have any visible sign of discomfort. Remains asleep, sitter at bedside. Pulse checked manually: 62.

## 2019-02-27 NOTE — Progress Notes (Signed)
Patient was observed getting out of bed and going to the dayroom for lunch and dinner with his assigned safety sitter.

## 2019-02-27 NOTE — Progress Notes (Signed)
Recreation Therapy Notes  Date: 02/27/2019  Time: 9:30 am   Location: Craft room   Behavioral response: N/A   Intervention Topic: Coping skills  Discussion/Intervention: Patient did not attend group.   Clinical Observations/Feedback:  Patient did not attend group.   Alexza Norbeck LRT/CTRS        Joanmarie Tsang 02/27/2019 11:12 AM

## 2019-02-27 NOTE — Progress Notes (Signed)
[facility] MD Progress Note  02/27/2019 4:17 PM Troy Fox  MRN:  [id number] Subjective: Patient seen and chart reviewed.  Patient with a history of schizoaffective disorder still recovering from episode of catatonia.  Patient is awake today most of the day.  He is able to sit up in bed and eat independently.  He goes to the bathroom on his own.  In conversation he was able to answer questions although rarely with more than 2 words at a time.  He denied having any suicidal thoughts and denied any hallucinations.  He acknowledged that he did want to go home.  He was not able to generate any spontaneous thoughts or speech otherwise.  Did seemed to be attentive when spoken to however.  Overall health seems to be improving and that he is putting weight on. Principal Problem: Schizoaffective disorder, depressive type ([place]) Diagnosis: Principal Problem:   Schizoaffective disorder, depressive type ([place]) Active Problems:   Catatonia   Mild malnutrition ([place])  Total Time spent with patient: 30 minutes  Past Psychiatric History: Patient has a long history of chronic mental illness multiple prior hospitalizations and recent catatonia that responded to ECT  Past Medical History:  Past Medical History:  Diagnosis Date  . Bipolar 1 disorder ([place])   . 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 GERD (gastroesophageal reflux disease)   . Nonverbal   . Schizoaffective disorder ([place])   . Tardive dyskinesia    History reviewed. No pertinent surgical history. Family History:  Family History  Adopted: Yes  Family history unknown: Yes   Family Psychiatric  History: None reported Social History:  Social History   Substance and Sexual Activity  Alcohol Use Never  . Frequency: Never     Social History   Substance and Sexual Activity  Drug Use Never    Social History   Socioeconomic History  . Marital status: Single    Spouse name: Not on file  . Number of children: Not on file  . Years of education: Not on  file  . Highest education level: Not on file  Occupational History  . Not on file  Social Needs  . Financial resource strain: Not on file  . Food insecurity    Worry: Not on file    Inability: Not on file  . Transportation needs    Medical: Not on file    Non-medical: Not on file  Tobacco Use  . Smoking status: Never Smoker  . Smokeless tobacco: Never Used  Substance and Sexual Activity  . Alcohol use: Never    Frequency: Never  . Drug use: Never  . Sexual activity: Not Currently  Lifestyle  . Physical activity    Days per week: Not on file    Minutes per session: Not on file  . Stress: Not on file  Relationships  . Social Herbalist on phone: Not on file    Gets together: Not on file    Attends religious service: Not on file    Active member of club or organization: Not on file    Attends meetings of clubs or organizations: Not on file    Relationship status: Not on file  Other Topics Concern  . Not on file  Social History Narrative  . Not on file   Additional Social History:    Pain Medications: please see mar Prescriptions: please see mar Over the Counter: please see mar History of alcohol / drug use?: No history of alcohol / drug abuse  Longest period of sobriety (when/how long): NA                    Sleep: Fair  Appetite:  Fair  Current Medications: Current Facility-Administered Medications  Medication Dose Route Frequency Provider Last Rate Last Dose  . acetaminophen (TYLENOL) tablet 650 mg  650 mg Oral Q6H PRN Haylyn Halberg T, MD      . alum & mag hydroxide-simeth (MAALOX/MYLANTA) 200-200-20 MG/5ML suspension 30 mL  30 mL Oral Q4H PRN Jafet Wissing T, MD      . FLUoxetine (PROZAC) capsule 40 mg  40 mg Oral Daily Will Schier, Jackquline DenmarkJohn T, MD   40 mg at 02/27/19 0937  . magnesium hydroxide (MILK OF MAGNESIA) suspension 30 mL  30 mL Oral Daily PRN Hoorain Kozakiewicz T, MD      . OLANZapine zydis (ZYPREXA) disintegrating tablet 10 mg  10 mg Oral QHS  Xochilt Conant, Jackquline DenmarkJohn T, MD   10 mg at 02/26/19 2202  . pantoprazole (PROTONIX) EC tablet 40 mg  40 mg Oral Daily Charleston Hankin, Jackquline DenmarkJohn T, MD   40 mg at 02/27/19 0937  . polyethylene glycol (MIRALAX / GLYCOLAX) packet 17 g  17 g Oral Daily Windle Huebert T, MD   17 g at 02/26/19 0930  . traZODone (DESYREL) tablet 100 mg  100 mg Oral QHS Zigmond Trela T, MD      . vitamin B-12 (CYANOCOBALAMIN) tablet 1,000 mcg  1,000 mcg Oral Daily Khayla Koppenhaver, Jackquline DenmarkJohn T, MD   1,000 mcg at 02/27/19 16100937    Lab Results: No results found for this or any previous visit (from the past 48 hour(s)).  Blood Alcohol level:  Lab Results  Component Value Date   ETH <10 01/24/2019    Metabolic Disorder Labs: Lab Results  Component Value Date   HGBA1C 4.9 01/30/2019   MPG 93.93 01/30/2019   No results found for: PROLACTIN No results found for: CHOL, TRIG, HDL, CHOLHDL, VLDL, LDLCALC  Physical Findings: AIMS: Facial and Oral Movements Muscles of Facial Expression: None, normal Lips and Perioral Area: None, normal Jaw: None, normal Tongue: None, normal,Extremity Movements Upper (arms, wrists, hands, fingers): None, normal Lower (legs, knees, ankles, toes): None, normal, Trunk Movements Neck, shoulders, hips: None, normal, Overall Severity Severity of abnormal movements (highest score from questions above): None, normal Incapacitation due to abnormal movements: None, normal Patient's awareness of abnormal movements (rate only patient's report): No Awareness, Dental Status Current problems with teeth and/or dentures?: No Does patient usually wear dentures?: No  CIWA:    COWS:     Musculoskeletal: Strength & Muscle Tone: within normal limits Gait & Station: unsteady Patient leans: N/A  Psychiatric Specialty Exam: Physical Exam  Nursing note and vitals reviewed. Constitutional: He appears well-developed and well-nourished.  HENT:  Head: Normocephalic and atraumatic.  Eyes: Pupils are equal, round, and reactive to light.  Conjunctivae are normal.  Neck: Normal range of motion.  Cardiovascular: Regular rhythm and normal heart sounds.  Respiratory: Effort normal. No respiratory distress.  GI: Soft.  Musculoskeletal: Normal range of motion.  Neurological: He is alert.  Skin: Skin is warm and dry.  Psychiatric: His affect is blunt. His speech is delayed. He is withdrawn. He is not agitated and not aggressive. Thought content is not paranoid. Cognition and memory are impaired. He expresses no homicidal and no suicidal ideation. He is noncommunicative.    Review of Systems  Unable to perform ROS: Psychiatric disorder    Blood pressure 99/63, pulse 62, temperature 98.6  F (37 C), temperature source Oral, resp. rate 18, height 5\' 5"  (1.651 m), weight 62.4 kg, SpO2 97 %.Body mass index is 22.88 kg/m.  General Appearance: Casual and Disheveled  Eye Contact:  Minimal  Speech:  Slow  Volume:  Decreased  Mood:  Euthymic  Affect:  Flat  Thought Process:  Disorganized  Orientation:  Negative  Thought Content:  Negative  Suicidal Thoughts:  No  Homicidal Thoughts:  No  Memory:  Immediate;   Fair Recent;   Poor Remote;   Poor  Judgement:  Impaired  Insight:  Shallow  Psychomotor Activity:  Decreased  Concentration:  Concentration: Poor  Recall:  Poor  Fund of Knowledge:  Poor  Language:  Poor  Akathisia:  No  Handed:  Right  AIMS (if indicated):     Assets:  Desire for Improvement Social Support  ADL's:  Impaired  Cognition:  Impaired,  Moderate  Sleep:  Number of Hours: 9.5     Treatment Plan Summary: Daily contact with patient to assess and evaluate symptoms and progress in treatment, Medication management and Plan Treatment team discussed patient's condition today.  As far as discharge planning we are still trying to find out what resources will be available for the patient to assist with staying at home after discharge.  Family is knowledgeable and cooperative.  However until then we also have the  difficulty that he remains very flat and withdrawn with little motivation and little activity.  Not clear to me whether this is negative schizophrenia, depression or baseline level of cognitive impairment.  In any case we are all going to work on trying to get him up and out of his bed and interacting with others and possibly attending groups.  Patient advised that this is crucial to making a discharge plan.  I have tried to simplify his medicine to remove anything that could be sedating during the daytime.  Spoke with reviewer today.  One idea that was suggested was clozapine which would certainly not be out of the question longer-term.  For now we are going to hope that perhaps we can get him up out of bed and active enough that we would even be able to do discharge this week.  Mordecai RasmussenJohn Coleby Yett, MD 02/27/2019, 4:17 PM

## 2019-02-27 NOTE — [facility] Group Notes (Signed)
Feelings Around Diagnosis 02/27/2019 1PM  Type of Therapy/Topic:  Group Therapy:  Feelings about Diagnosis  Participation Level:  Did Not Attend   Description of Group:   This group will allow patients to explore their thoughts and feelings about diagnoses they have received. Patients will be guided to explore their level of understanding and acceptance of these diagnoses. Facilitator will encourage patients to process their thoughts and feelings about the reactions of others to their diagnosis and will guide patients in identifying ways to discuss their diagnosis with significant others in their lives. This group will be process-oriented, with patients participating in exploration of their own experiences, giving and receiving support, and processing challenge from other group members.   Therapeutic Goals: 1. Patient will demonstrate understanding of diagnosis as evidenced by identifying two or more symptoms of the disorder 2. Patient will be able to express two feelings regarding the diagnosis 3. Patient will demonstrate their ability to communicate their needs through discussion and/or role play  Summary of Patient Progress:       Therapeutic Modalities:   Cognitive Behavioral Therapy Brief Therapy Feelings Identification    Yvette Rack, LCSW 02/27/2019 2:07 PM

## 2019-02-27 NOTE — Progress Notes (Signed)
Patient in bed sleeping sitter monitoring on 1:1 for safety. No visible sign of distress.

## 2019-02-27 NOTE — Progress Notes (Signed)
1:1 Patient Hourly Rounding  1000: Patient is in his room, asleep, with his assigned safety sitter present at bedside.  1400 : Patient is in bed, asleep, with his assigned safety sitter present at bedside.  1800: Patient is in the bed, asleep, with his assigned safety sitter present at bedside.

## 2019-02-27 NOTE — Plan of Care (Signed)
D- Patient alert and oriented. Patient presents in a pleasant mood on assessment stating that he slept alright last night and had no complaints to voice to this Probation officer. Patient denies SI, HI, AVH, and pain at this time. Patient also denies any signs/symptoms of depression and anxiety. Patient had no stated goals for today.  A- Scheduled medications administered to patient, per MD orders. Support and encouragement provided.  Routine safety checks conducted every 15 minutes.  Patient informed to notify staff with problems or concerns.  R- No adverse drug reactions noted. Patient contracts for safety at this time. Patient compliant with medications and treatment plan. Patient receptive, calm, and cooperative. Patient interacts well with others on the unit.  Patient remains safe at this time.  Problem: Education: Goal: Knowledge of [employer] General Education information/materials will improve Outcome: Progressing Goal: Emotional status will improve Outcome: Progressing Goal: Mental status will improve Outcome: Progressing Goal: Verbalization of understanding the information provided will improve Outcome: Progressing   Problem: Activity: Goal: Interest or engagement in activities will improve Outcome: Progressing Goal: Sleeping patterns will improve Outcome: Progressing   Problem: Coping: Goal: Ability to verbalize frustrations and anger appropriately will improve Outcome: Progressing Goal: Ability to demonstrate self-control will improve Outcome: Progressing   Problem: Health Behavior/Discharge Planning: Goal: Identification of resources available to assist in meeting health care needs will improve Outcome: Progressing Goal: Compliance with treatment plan for underlying cause of condition will improve Outcome: Progressing   Problem: Physical Regulation: Goal: Ability to maintain clinical measurements within normal limits will improve Outcome: Progressing   Problem: Safety: Goal:  Periods of time without injury will increase Outcome: Progressing

## 2019-02-27 NOTE — Plan of Care (Signed)
Stayed in bed. Medications taken to his room. Pleasant but not engaging in long conversations.

## 2019-02-28 NOTE — Progress Notes (Signed)
11-12 pm: Patient walked down to the dinning area where he ate his lunch. Patient walked down the hall to exercise with CNA. Patient returned to room and rested. No distress noted.  12-1 PM: Patient in room; in bed; eyes closed; breaths even and unlabored. CNA with in reach.  1PM-2PM: Patient resting in room; eyes closed; breaths even and unlabored; CNA with in reach, No distress noted.  2PM-3PM : Patient in bed; eyes open, alert, no distress noted.CNA within reach.  3Pm-4PM: Patient in bed; eyes open, No distresss, No pain, CNA within reach.  4PM- 5PM: Patient walked down to the dinning area to eat dinner, No distress noted, CNA within reach.

## 2019-02-28 NOTE — Progress Notes (Signed)
[facility]  02/28/2019 5:17 PM Troy DuhamelStephen Fox  MRN:  [id number] Subjective: Follow-up for this patient with schizoaffective disorder.  Patient was awake throughout the day when I went in to see him.  Spent some time doing some psychoeducation and supportive interviewing with the patient encouraging him to get out of bed so that we can come up with a better discharge plan.  Got him to acknowledge that he would like to go home.  He was a little more talkative today than he has been previously able to speak in full sentences a couple times.  The nursing staff of been doing a good job getting him up and at least out around people every now and then.  Physically he seems to be doing much better and he looks to me like he could possibly tolerate day programming or a clubhouse model possibly.  Denies being depressed.  Denies hallucinations.  Denies any dangerous thinking. Principal Problem: Schizoaffective disorder, depressive type (HCC) Diagnosis: Principal Problem:   Schizoaffective disorder, depressive type (HCC) Active Problems:   Catatonia   Mild malnutrition (HCC)  Total Time spent with patient: 30 minutes  Past Psychiatric History: Patient apparently has a longstanding history of chronic mental illness with a diagnosis of schizoaffective disorder.  Previous good response to ECT when catatonic  Past Medical History:  Past Medical History:  Diagnosis Date  . Bipolar 1 disorder (HCC)   . 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 GERD (gastroesophageal reflux disease)   . Nonverbal   . Schizoaffective disorder (HCC)   . Tardive dyskinesia    History reviewed. No pertinent surgical history. Family History:  Family History  Adopted: Yes  Family history unknown: Yes   Family Psychiatric  History: None reported Social History:  Social History   Substance and Sexual Activity  Alcohol Use Never  . Frequency: Never     Social History   Substance and Sexual Activity  Drug Use Never     Social History   Socioeconomic History  . Marital status: Single    Spouse name: Not on file  . Number of children: Not on file  . Years of education: Not on file  . Highest education level: Not on file  Occupational History  . Not on file  Social Needs  . Financial resource strain: Not on file  . Food insecurity    Worry: Not on file    Inability: Not on file  . Transportation needs    Medical: Not on file    Non-medical: Not on file  Tobacco Use  . Smoking status: Never Smoker  . Smokeless tobacco: Never Used  Substance and Sexual Activity  . Alcohol use: Never    Frequency: Never  . Drug use: Never  . Sexual activity: Not Currently  Lifestyle  . Physical activity    Days per week: Not on file    Minutes per session: Not on file  . Stress: Not on file  Relationships  . Social Musicianconnections    Talks on phone: Not on file    Gets together: Not on file    Attends religious service: Not on file    Active member of club or organization: Not on file    Attends meetings of clubs or organizations: Not on file    Relationship status: Not on file  Other Topics Concern  . Not on file  Social History Narrative  . Not on file   Additional Social History:    Pain Medications:  please see mar Prescriptions: please see mar Over the Counter: please see mar History of alcohol / drug use?: No history of alcohol / drug abuse Longest period of sobriety (when/how long): NA                    Sleep: Fair  Appetite:  Fair  Current Medications: Current Facility-Administered Medications  Medication Dose Route Frequency Provider Last Rate Last Dose  . acetaminophen (TYLENOL) tablet 650 mg  650 mg Oral Q6H PRN Odena Mcquaid T, MD      . alum & mag hydroxide-simeth (MAALOX/MYLANTA) 200-200-20 MG/5ML suspension 30 mL  30 mL Oral Q4H PRN Aliyanna Wassmer T, MD      . FLUoxetine (PROZAC) capsule 40 mg  40 mg Oral Daily Emylia Latella, Jackquline DenmarkJohn T, MD   40 mg at 02/28/19 0816  . magnesium  hydroxide (MILK OF MAGNESIA) suspension 30 mL  30 mL Oral Daily PRN Gyanna Jarema T, MD      . OLANZapine zydis (ZYPREXA) disintegrating tablet 10 mg  10 mg Oral QHS Joquan Lotz, Jackquline DenmarkJohn T, MD   10 mg at 02/27/19 2229  . pantoprazole (PROTONIX) EC tablet 40 mg  40 mg Oral Daily Nialah Saravia, Jackquline DenmarkJohn T, MD   40 mg at 02/28/19 0816  . polyethylene glycol (MIRALAX / GLYCOLAX) packet 17 g  17 g Oral Daily Lotoya Casella, Jackquline DenmarkJohn T, MD   17 g at 02/28/19 0816  . traZODone (DESYREL) tablet 100 mg  100 mg Oral QHS [place] Kirchner, Jackquline DenmarkJohn T, MD   100 mg at 02/27/19 2229  . vitamin B-12 (CYANOCOBALAMIN) tablet 1,000 mcg  1,000 mcg Oral Daily [place] Kinyon, Jackquline DenmarkJohn T, MD   1,000 mcg at 02/28/19 19140816    Lab Results: No results found for this or any previous visit (from the past 48 hour(s)).  Blood Alcohol level:  Lab Results  Component Value Date   ETH <10 01/24/2019    Metabolic Disorder Labs: Lab Results  Component Value Date   HGBA1C 4.9 01/30/2019   MPG 93.93 01/30/2019   No results found for: PROLACTIN No results found for: CHOL, TRIG, HDL, CHOLHDL, VLDL, LDLCALC  Physical Findings: AIMS: Facial and Oral Movements Muscles of Facial Expression: None, normal Lips and Perioral Area: None, normal Jaw: None, normal Tongue: None, normal,Extremity Movements Upper (arms, wrists, hands, fingers): None, normal Lower (legs, knees, ankles, toes): None, normal, Trunk Movements Neck, shoulders, hips: None, normal, Overall Severity Severity of abnormal movements (highest score from questions above): None, normal Incapacitation due to abnormal movements: None, normal Patient's awareness of abnormal movements (rate only patient's report): No Awareness, Dental Status Current problems with teeth and/or dentures?: No Does patient usually wear dentures?: No  CIWA:    COWS:     Musculoskeletal: Strength & Muscle Tone: within normal limits Gait & Station: normal Patient leans: N/A  Psychiatric Specialty Exam: Physical Exam  Nursing note  and vitals reviewed. Constitutional: He appears well-developed and well-nourished.  HENT:  Head: Normocephalic and atraumatic.  Eyes: Pupils are equal, round, and reactive to light. Conjunctivae are normal.  Neck: Normal range of motion.  Cardiovascular: Regular rhythm and normal heart sounds.  Respiratory: Effort normal. No respiratory distress.  GI: Soft.  Musculoskeletal: Normal range of motion.  Neurological: He is alert.  Skin: Skin is warm and dry.  Psychiatric: Judgment normal. His affect is blunt. His speech is delayed. He is slowed. He expresses no homicidal and no suicidal ideation. He exhibits abnormal recent memory.    Review of Systems  Constitutional: Negative.   HENT: Negative.   Eyes: Negative.   Respiratory: Negative.   Cardiovascular: Negative.   Gastrointestinal: Negative.   Musculoskeletal: Negative.   Skin: Negative.   Neurological: Negative.   Psychiatric/Behavioral: Negative.     Blood pressure (!) 90/54, pulse (!) 50, temperature 98.2 F (36.8 C), temperature source Oral, resp. rate 18, height 5\' 5"  (1.651 m), weight 62.4 kg, SpO2 96 %.Body mass index is 22.88 kg/m.  General Appearance: Casual  Eye Contact:  Minimal  Speech:  Slow  Volume:  Decreased  Mood:  Euthymic  Affect:  Blunt  Thought Process:  Coherent  Orientation:  Full (Time, Place, and Person)  Thought Content:  Tangential  Suicidal Thoughts:  No  Homicidal Thoughts:  No  Memory:  Immediate;   Fair Recent;   Fair Remote;   Fair  Judgement:  Fair  Insight:  Fair  Psychomotor Activity:  Decreased  Concentration:  Concentration: Poor  Recall:  Poor  Fund of Knowledge:  Poor  Language:  Poor  Akathisia:  No  Handed:  Right  AIMS (if indicated):     Assets:  Desire for Improvement Housing Resilience  ADL's:  Impaired  Cognition:  Impaired,  Moderate  Sleep:  Number of Hours: 7.5     Treatment Plan Summary: Daily contact with patient to assess and evaluate symptoms and  progress in treatment, Medication management and Plan To my examination, and I think this agrees with what other people are seen, he looks like he is doing significantly better.  The social worker who had been contacting the patient's family is not available today but hopefully in the next 1 to 2 days we will be able to work out a discharge plan.  Patient is tolerating his medicine well.  Getting up and interacting better.  Does not appear to be acutely dangerous.  Supportive counseling and a lot of praise and encouragement to the patient for his efforts.  Alethia Berthold, MD 02/28/2019, 5:17 PM

## 2019-02-28 NOTE — Plan of Care (Signed)
Patient is alert and oriented X 2, oriented to self and unit. Patient is on a 1:1 due to unsteady gait. Patient walking to the dinning area to eat meals. Patient denies pain 0/10. Patient sleeping and eating well.  Problem: Education: Goal: Knowledge of [facility] Education information/materials will improve Outcome: Progressing Goal: Emotional status will improve Outcome: Progressing Goal: Mental status will improve Outcome: Progressing Goal: Verbalization of understanding the information provided will improve Outcome: Progressing   Problem: Activity: Goal: Interest or engagement in activities will improve Outcome: Progressing Goal: Sleeping patterns will improve Outcome: Progressing   Problem: Coping: Goal: Ability to verbalize frustrations and anger appropriately will improve Outcome: Progressing Goal: Ability to demonstrate self-control will improve Outcome: Progressing

## 2019-02-28 NOTE — Plan of Care (Signed)
  Problem: Activity: Goal: Interest or engagement in activities will improve Outcome: Progressing Goal: Sleeping patterns will improve Outcome: Progressing  D: Patient has been out of room more. Ambulated to day room for snack. Ambulated to phone to talk with father. MHT within reach. Affect flat. Mood is pleasant. Denies SI, HI and AVH. A: Continue 1:1 for safety. R: Safety maintained.

## 2019-02-28 NOTE — Progress Notes (Signed)
Patient in bed with eyes closed no distress noted, resting quietly in bed with 1:1 sitter at bedside, will continue to monitor.

## 2019-02-28 NOTE — Progress Notes (Signed)
5pm-6pm: Patient in room; eyes open; breaths even, patient denies pain. No concerns. CNA within reach.  6pm-7pm: Patient in room; eyes open; patient laying in bed, small talk with nurse, CNA within reach, no complaints or concerns; no distress noted.Safety maintained.

## 2019-02-28 NOTE — [facility] Group Notes (Signed)
LCSW Group Therapy Note  02/28/2019 1:00 PM  Type of Therapy/Topic:  Group Therapy:  Emotion Regulation  Participation Level:  Did Not Attend   Description of Group:   The purpose of this group is to assist patients in learning to regulate negative emotions and experience positive emotions. Patients will be guided to discuss ways in which they have been vulnerable to their negative emotions. These vulnerabilities will be juxtaposed with experiences of positive emotions or situations, and patients will be challenged to use positive emotions to combat negative ones. Special emphasis will be placed on coping with negative emotions in conflict situations, and patients will process healthy conflict resolution skills.  Therapeutic Goals: 1. Patient will identify two positive emotions or experiences to reflect on in order to balance out negative emotions 2. Patient will label two or more emotions that they find the most difficult to experience 3. Patient will demonstrate positive conflict resolution skills through discussion and/or role plays  Summary of Patient Progress: X  Therapeutic Modalities:   Cognitive Behavioral Therapy Feelings Identification Dialectical Behavioral Therapy  Assunta Curtis, MSW, LCSW 02/28/2019 12:49 PM

## 2019-02-28 NOTE — Progress Notes (Signed)
Patient continues to be on 1:1 no distress noted, symmetrical lung expansion noted, in bed with eyes closed will continue to monitor.

## 2019-02-28 NOTE — Progress Notes (Signed)
Recreation Therapy Notes    Date: 02/28/2019   Time: 9:30 am   Location: Craft room   Behavioral response: N/A   Intervention Topic: Anger Management   Discussion/Intervention: Patient did not attend group.   Clinical Observations/Feedback:  Patient did not attend group.   Muskaan Smet LRT/CTRS          Adriahna Shearman 02/28/2019 10:31 AM 

## 2019-02-28 NOTE — Progress Notes (Signed)
  7am-8am: Patient walked down to the dinning room area, eating breakfast, patient took medications. CNA within reach.   9-10 am: Patient awake in room, laying down in bed, eyes open, breaths unlabored. CNA  Within reach.

## 2019-02-28 NOTE — Tx Team (Signed)
Interdisciplinary Treatment and Diagnostic Plan Update  02/28/2019 Time of Session: 830am Haley Roza MRN: [id number]  Principal Diagnosis: Schizoaffective disorder, depressive type [facility])  Secondary Diagnoses: Principal Problem:   Schizoaffective disorder, depressive type ([place]) Active Problems:   Catatonia   Mild malnutrition ([place])   Current Medications:  Current Facility-Administered Medications  Medication Dose Route Frequency Provider Last Rate Last Dose  . acetaminophen (TYLENOL) tablet 650 mg  650 mg Oral Q6H PRN Clapacs, John T, MD      . alum & mag hydroxide-simeth (MAALOX/MYLANTA) 200-200-20 MG/5ML suspension 30 mL  30 mL Oral Q4H PRN Clapacs, John T, MD      . FLUoxetine (PROZAC) capsule 40 mg  40 mg Oral Daily Clapacs, Madie Reno, MD   40 mg at 02/28/19 0816  . magnesium hydroxide (MILK OF MAGNESIA) suspension 30 mL  30 mL Oral Daily PRN Clapacs, John T, MD      . OLANZapine zydis (ZYPREXA) disintegrating tablet 10 mg  10 mg Oral QHS Clapacs, Madie Reno, MD   10 mg at 02/27/19 2229  . pantoprazole (PROTONIX) EC tablet 40 mg  40 mg Oral Daily Clapacs, Madie Reno, MD   40 mg at 02/28/19 0816  . polyethylene glycol (MIRALAX / GLYCOLAX) packet 17 g  17 g Oral Daily Clapacs, John T, MD   17 g at 02/26/19 0930  . traZODone (DESYREL) tablet 100 mg  100 mg Oral QHS Clapacs, Madie Reno, MD   100 mg at 02/27/19 2229  . vitamin B-12 (CYANOCOBALAMIN) tablet 1,000 mcg  1,000 mcg Oral Daily Clapacs, Madie Reno, MD   1,000 mcg at 02/28/19 0816   PTA Medications: Medications Prior to Admission  Medication Sig Dispense Refill Last Dose  . atenolol (TENORMIN) 25 MG tablet Take 1 tablet (25 mg total) by mouth daily. 90 tablet 0 02/18/2019 at Unknown time  . busPIRone (BUSPAR) 5 MG tablet Take 1 tablet (5 mg total) by mouth 2 (two) times daily. (Patient taking differently: Take 5 mg by mouth 2 (two) times daily as needed. ) 60 tablet 0 02/18/2019 at Unknown time  . Cyanocobalamin (VITAMIN B 12) 500 MCG TABS Take 500  mcg by mouth daily. 90 tablet 0 02/18/2019 at Unknown time  . LORazepam (ATIVAN) 0.5 MG tablet Take 1 tablet (0.5 mg total) by mouth every 8 (eight) hours as needed for anxiety. 90 tablet 2 02/18/2019 at Unknown time  . omeprazole (PRILOSEC) 40 MG capsule Take 1 capsule (40 mg total) by mouth daily. 90 capsule 0 02/18/2019 at Unknown time  . paliperidone (INVEGA) 6 MG 24 hr tablet Take 1 tablet (6 mg total) by mouth daily. 90 tablet 0 02/18/2019 at Unknown time  . polyethylene glycol (MIRALAX) 17 g packet Take 17 g by mouth daily as needed for moderate constipation. 72 packet 0 02/18/2019 at Unknown time  . sertraline (ZOLOFT) 50 MG tablet Take 3 tablets (150 mg total) by mouth daily. 270 tablet 0 02/18/2019 at Unknown time  . traZODone (DESYREL) 50 MG tablet Take 1 tablet (50 mg total) by mouth at bedtime. 90 tablet 0 02/18/2019 at Unknown time  . Valbenazine Tosylate (INGREZZA) 40 & 80 MG CPPK Take 40 mg by mouth daily for 7 days, THEN 80 mg daily for 30 days. 30 each 0 02/18/2019 at Unknown time  . benztropine (COGENTIN) 1 MG tablet Take 1 tablet (1 mg total) by mouth 2 (two) times daily for 7 days. 14 tablet 0     Patient Stressors: Financial difficulties Health  problems  Patient Strengths: Motivation for treatment/growth Supportive family/friends  Treatment Modalities: Medication Management, Group therapy, Case management,  1 to 1 session with clinician, Psychoeducation, Recreational therapy.   Physician Treatment Plan for Primary Diagnosis: Schizoaffective disorder, depressive type ([place]) Long Term Goal(s): Improvement in symptoms so as ready for discharge Improvement in symptoms so as ready for discharge   Short Term Goals: Ability to verbalize feelings will improve Compliance with prescribed medications will improve Ability to maintain clinical measurements within normal limits will improve  Medication Management: Evaluate patient's response, side effects, and tolerance of medication  regimen.  Therapeutic Interventions: 1 to 1 sessions, Unit Group sessions and Medication administration.  Evaluation of Outcomes: Not Met  Physician Treatment Plan for Secondary Diagnosis: Principal Problem:   Schizoaffective disorder, depressive type ([place]) Active Problems:   Catatonia   Mild malnutrition ([place])  Long Term Goal(s): Improvement in symptoms so as ready for discharge Improvement in symptoms so as ready for discharge   Short Term Goals: Ability to verbalize feelings will improve Compliance with prescribed medications will improve Ability to maintain clinical measurements within normal limits will improve     Medication Management: Evaluate patient's response, side effects, and tolerance of medication regimen.  Therapeutic Interventions: 1 to 1 sessions, Unit Group sessions and Medication administration.  Evaluation of Outcomes: Not Met   RN Treatment Plan for Primary Diagnosis: Schizoaffective disorder, depressive type ([place]) Long Term Goal(s): Knowledge of disease and therapeutic regimen to maintain health will improve  Short Term Goals: Ability to identify and develop effective coping behaviors will improve and Compliance with prescribed medications will improve  Medication Management: RN will administer medications as ordered by provider, will assess and evaluate patient's response and provide education to patient for prescribed medication. RN will report any adverse and/or side effects to prescribing provider.  Therapeutic Interventions: 1 on 1 counseling sessions, Psychoeducation, Medication administration, Evaluate responses to treatment, Monitor vital signs and CBGs as ordered, Perform/monitor CIWA, COWS, AIMS and Fall Risk screenings as ordered, Perform wound care treatments as ordered.  Evaluation of Outcomes: Not Met   LCSW Treatment Plan for Primary Diagnosis: Schizoaffective disorder, depressive type ([place]) Long Term Goal(s): Safe transition to appropriate  next level of care at discharge, Engage patient in therapeutic group addressing interpersonal concerns.  Short Term Goals: Engage patient in aftercare planning with referrals and resources  Therapeutic Interventions: Assess for all discharge needs, 1 to 1 time with Social worker, Explore available resources and support systems, Assess for adequacy in community support network, Educate family and significant other(s) on suicide prevention, Complete Psychosocial Assessment, Interpersonal group therapy.  Evaluation of Outcomes: Not Met   Progress in Treatment: Attending groups: No. Participating in groups: No. Taking medication as prescribed: Yes. Toleration medication: Yes. Family/Significant other contact made: Yes, individual(s) contacted:  pts father Patient understands diagnosis: No. Discussing patient identified problems/goals with staff: No. Medical problems stabilized or resolved: No. Denies suicidal/homicidal ideation: Yes Issues/concerns per patient self-inventory: No. Other: N/A  New problem(s) identified: No, Describe:  None reported  New Short Term/Long Term Goal(s):Pt requires assistance with walking and experiencing minimal verbal communication.   Patient Goals:  Pt unable to provide a goal at this time.  Discharge Plan or Barriers: Pt will resume outpatient ECT. 02/07/19-No change in progress. Will receive ECT today. 02/14/19-Pt will receive ECT today. Pt able to give one word responses to some questions, unable to sign  documentation to schedule follow up care at this time. Dr. Weber Cooks reports he will contact  the pt's parents to see if he can return home in his current state and resume outpatient ECT. D/C plan TBD. 02/19/19- Pt is receiving ECT, still requires assistance with walking and minimal verbal communication. D/C plan TBD.  UPDATE 02/23/2019:  Review of the patients chart indicates that ECT has been stopped at this time due to concerns that the patient was regressing.   Phone conference with father, primary [place] and psychiatrist was completed and father is requesting respite referral and/or possible referral to a day program.  CSW will follow up. UPDATE 02/28/19: Pt is eligible for services at [facility] in [place]. Pt will have to complete an assessment at discharge for PSR services and express interest in their day program. Pt is still not receiving ECT at this time and has improved with walking without assistance for short distances.  Reason for Continuation of Hospitalization: Medication stabilization, ECT  Estimated Length of Stay: TBD  Recreational Therapy: Patient Stressors: N/A  Patient Goal: Patient will engage in groups without prompting or encouragement from LRT x3 group sessions within 5 recreation therapy group sessions  Attendees: Patient: 02/28/2019 9:20 AM  Physician: Alethia Berthold 02/28/2019 9:20 AM  Nursing:  02/28/2019 9:20 AM  RN Care Manager: 02/28/2019 9:20 AM  Social Worker:   Evalina Field, [place] 02/28/2019 9:20 AM  Recreational Therapist:  02/28/2019 9:20 AM  Other:  02/28/2019 9:20 AM  Other:  02/28/2019 9:20 AM  Other: 02/28/2019 9:20 AM    Scribe for Treatment Team: Mariann Laster Atianna Haidar, LCSW 02/28/2019 9:20 AM

## 2019-02-28 NOTE — Progress Notes (Signed)
1:1 observation [id number] D: Patient has been up and out of his room much more this evening. Ambulating with less difficulty. Ambulating more often than usual. Denies SI, HI and AVH. Affect is flat. Mood is pleasant. Observed shifting his weight from foot to foot while on the phone. Restless. Talked to his father on the phone. Ambulated to the dayroom for snack. MHT within reach. A: Continue 1:1 for safety. R: Safety maintained. 0000-0400 D: Patient has been resting in bed with eyes closed. MHT within reach.  A: Continue 1:1 for safety. R: Safety maintained. 0400-0700 D: Continues to rest in bed. MHT within reach. Voices no complaints. A: Continue 1:1 for safety. R: Safety maintained.

## 2019-02-28 NOTE — Progress Notes (Signed)
Patient alert and oriented x 4, affect is flat but he brightens upon approach patients is interacting appropriately with peers and staff this evening, he ambulated to the bathroom without assistance. Patient thought's are organized and coherent, he didn't attend evening wrap up group but he sat and had snack in the dayroom. Patient continues to be on 1:1 no distress noted will continue to monitor.

## 2019-03-01 NOTE — Progress Notes (Signed)
Recreation Therapy Notes  Date: 03/01/2019  Time: 9:30 am   Location: Craft room   Behavioral response: N/A   Intervention Topic: Communication  Discussion/Intervention: Patient did not attend group.   Clinical Observations/Feedback:  Patient did not attend group.   [name] LRT/CTRS        Troy Fox 03/01/2019 11:04 AM 

## 2019-03-01 NOTE — [facility] Group Notes (Signed)
LCSW Group Therapy Note  03/01/2019 12:38 PM  Type of Therapy/Topic:  Group Therapy:  Balance in Life  Participation Level:  Did Not Attend  Description of Group:    This group will address the concept of balance and how it feels and looks when one is unbalanced. Patients will be encouraged to process areas in their lives that are out of balance and identify reasons for remaining unbalanced. Facilitators will guide patients in utilizing problem-solving interventions to address and correct the stressor making their life unbalanced. Understanding and applying boundaries will be explored and addressed for obtaining and maintaining a balanced life. Patients will be encouraged to explore ways to assertively make their unbalanced needs known to significant others in their lives, using other group members and facilitator for support and feedback.  Therapeutic Goals: 1. Patient will identify two or more emotions or situations they have that consume much of in their lives. 2. Patient will identify signs/triggers that life has become out of balance:  3. Patient will identify two ways to set boundaries in order to achieve balance in their lives:  4. Patient will demonstrate ability to communicate their needs through discussion and/or role plays  Summary of Patient Progress: x     Therapeutic Modalities:   Cognitive Behavioral Therapy Solution-Focused Therapy Assertiveness Training  Evalina Field, MSW, LCSW Clinical Social Work 03/01/2019 12:38 PM

## 2019-03-01 NOTE — Progress Notes (Signed)
7am- 8am: Patient walked down to the dinning area to eat breakfast, medications given, Patient expressed no concerns. Patient denies pain 0/10, patient denies SI, HI and AVH. 1:1 monitoring continues.  8 am- 9am: Patient laying down in bed; eyes open, staring out the window. Patient has no distress, MHT within reach.  9am-10am: Patient in bed, eyes closed; breaths even and unlabored, no distress noted MHT within reach.  10- 11am: Patient in bed, eyes closed, breaths even, no distress, MHT within reach.  11-12PM: Patient walked down to lunch, finished all of his food 100% and returned to his room. Patient awake, no distress, MHT within reach.  12 PM- 1PM: Patient in bed, Laying down in bed; awake, no distress, noted. MHT within reach.   1pm-2pm Patient in bed, eyes closed, breaths even, no distressed.

## 2019-03-01 NOTE — Progress Notes (Signed)
PT Cancellation Note  Patient Details Name: Troy Fox MRN: [id number] DOB: [birth year]   Cancelled Treatment:    Reason Eval/Treat Not Completed: Patient declined, no reason specified   Pt offered session.  Pt has just returned to bed after lunch.  Stated he did not want to participate in session today.  Stated he is moving around better and it was confirmed by sitter in room.  Chart reviewed and also notes pt is moving around more on the unit.  Encouraged continued mobility and voiced understanding.  Will continue as appropriate.   Chesley Noon 03/01/2019, 12:03 PM

## 2019-03-01 NOTE — Progress Notes (Signed)
[facility]  03/01/2019 6:45 PM Troy DuhamelStephen Fox  MRN:  [id number] Subjective: Follow-up for this patient with schizoaffective disorder.  Responded to ECT now gradually responding to antipsychotic and antidepressant medicine.  Patient is awake throughout the day and able to engage in some conversation although he is still very limited.  With encouragement he will get up and attend some groups and be around other people.  Cognition appears to still be pretty limited. Principal Problem: Schizoaffective disorder, depressive type (HCC) Diagnosis: Principal Problem:   Schizoaffective disorder, depressive type (HCC) Active Problems:   Catatonia   Mild malnutrition (HCC)  Total Time spent with patient: 20 minutes  Past Psychiatric History: Past history of schizoaffective disorder longstanding mental illness.  Past Medical History:  Past Medical History:  Diagnosis Date  . Bipolar 1 disorder (HCC)   . 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 GERD (gastroesophageal reflux disease)   . Nonverbal   . Schizoaffective disorder (HCC)   . Tardive dyskinesia    History reviewed. No pertinent surgical history. Family History:  Family History  Adopted: Yes  Family history unknown: Yes   Family Psychiatric  History: See previous Social History:  Social History   Substance and Sexual Activity  Alcohol Use Never  . Frequency: Never     Social History   Substance and Sexual Activity  Drug Use Never    Social History   Socioeconomic History  . Marital status: Single    Spouse name: Not on file  . Number of children: Not on file  . Years of education: Not on file  . Highest education level: Not on file  Occupational History  . Not on file  Social Needs  . Financial resource strain: Not on file  . Food insecurity    Worry: Not on file    Inability: Not on file  . Transportation needs    Medical: Not on file    Non-medical: Not on file  Tobacco Use  . Smoking status: Never Smoker   . Smokeless tobacco: Never Used  Substance and Sexual Activity  . Alcohol use: Never    Frequency: Never  . Drug use: Never  . Sexual activity: Not Currently  Lifestyle  . Physical activity    Days per week: Not on file    Minutes per session: Not on file  . Stress: Not on file  Relationships  . Social Musicianconnections    Talks on phone: Not on file    Gets together: Not on file    Attends religious service: Not on file    Active member of club or organization: Not on file    Attends meetings of clubs or organizations: Not on file    Relationship status: Not on file  Other Topics Concern  . Not on file  Social History Narrative  . Not on file   Additional Social History:    Pain Medications: please see mar Prescriptions: please see mar Over the Counter: please see mar History of alcohol / drug use?: No history of alcohol / drug abuse Longest period of sobriety (when/how long): NA                    Sleep: Fair  Appetite:  Fair  Current Medications: Current Facility-Administered Medications  Medication Dose Route Frequency Provider Last Rate Last Dose  . acetaminophen (TYLENOL) tablet 650 mg  650 mg Oral Q6H PRN Clapacs, Jackquline DenmarkJohn T, MD      . alum & mag  hydroxide-simeth (MAALOX/MYLANTA) 200-200-20 MG/5ML suspension 30 mL  30 mL Oral Q4H PRN Clapacs, John T, MD      . FLUoxetine (PROZAC) capsule 40 mg  40 mg Oral Daily Clapacs, Madie Reno, MD   40 mg at 03/01/19 0817  . magnesium hydroxide (MILK OF MAGNESIA) suspension 30 mL  30 mL Oral Daily PRN Clapacs, John T, MD      . OLANZapine zydis (ZYPREXA) disintegrating tablet 10 mg  10 mg Oral QHS Clapacs, Madie Reno, MD   10 mg at 02/28/19 2110  . pantoprazole (PROTONIX) EC tablet 40 mg  40 mg Oral Daily Clapacs, Madie Reno, MD   40 mg at 03/01/19 0821  . polyethylene glycol (MIRALAX / GLYCOLAX) packet 17 g  17 g Oral Daily Clapacs, Madie Reno, MD   17 g at 03/01/19 0814  . traZODone (DESYREL) tablet 100 mg  100 mg Oral QHS Clapacs, Madie Reno,  MD   100 mg at 02/28/19 2109  . vitamin B-12 (CYANOCOBALAMIN) tablet 1,000 mcg  1,000 mcg Oral Daily Clapacs, Madie Reno, MD   1,000 mcg at 03/01/19 4818    Lab Results: No results found for this or any previous visit (from the past 48 hour(s)).  Blood Alcohol level:  Lab Results  Component Value Date   ETH <10 56/31/4970    Metabolic Disorder Labs: Lab Results  Component Value Date   HGBA1C 4.9 01/30/2019   MPG 93.93 01/30/2019   No results found for: PROLACTIN No results found for: CHOL, TRIG, HDL, CHOLHDL, VLDL, LDLCALC  Physical Findings: AIMS: Facial and Oral Movements Muscles of Facial Expression: None, normal Lips and Perioral Area: None, normal Jaw: None, normal Tongue: None, normal,Extremity Movements Upper (arms, wrists, hands, fingers): None, normal Lower (legs, knees, ankles, toes): None, normal, Trunk Movements Neck, shoulders, hips: None, normal, Overall Severity Severity of abnormal movements (highest score from questions above): None, normal Incapacitation due to abnormal movements: None, normal Patient's awareness of abnormal movements (rate only patient's report): No Awareness, Dental Status Current problems with teeth and/or dentures?: No Does patient usually wear dentures?: No  CIWA:    COWS:     Musculoskeletal: Strength & Muscle Tone: within normal limits Gait & Station: normal Patient leans: N/A  Psychiatric Specialty Exam: Physical Exam  Nursing note and vitals reviewed. Constitutional: He appears well-developed and well-nourished.  HENT:  Head: Normocephalic and atraumatic.  Eyes: Pupils are equal, round, and reactive to light. Conjunctivae are normal.  Neck: Normal range of motion.  Cardiovascular: Normal heart sounds.  Respiratory: Effort normal.  GI: Soft.  Musculoskeletal: Normal range of motion.  Neurological: He is alert.  Skin: Skin is warm and dry.  Psychiatric: His affect is blunt. His speech is delayed. He is slowed and  withdrawn. Cognition and memory are impaired. He expresses no homicidal and no suicidal ideation.    Review of Systems  Constitutional: Negative.   HENT: Negative.   Eyes: Negative.   Respiratory: Negative.   Cardiovascular: Negative.   Gastrointestinal: Negative.   Musculoskeletal: Negative.   Skin: Negative.   Neurological: Negative.   Psychiatric/Behavioral: Negative.     Blood pressure (!) 94/46, pulse (!) 54, temperature 98 F (36.7 C), resp. rate 18, height 5\' 5"  (1.651 m), weight 62.4 kg, SpO2 96 %.Body mass index is 22.88 kg/m.  General Appearance: Casual  Eye Contact:  Fair  Speech:  Slow  Volume:  Decreased  Mood:  Euthymic  Affect:  Flat  Thought Process:  Disorganized  Orientation:  Full (Time, Place, and Person)  Thought Content:  Tangential  Suicidal Thoughts:  No  Homicidal Thoughts:  No  Memory:  Immediate;   Fair Recent;   Fair Remote;   Fair  Judgement:  Impaired  Insight:  Shallow  Psychomotor Activity:  Decreased  Concentration:  Concentration: Poor  Recall:  Poor  Fund of Knowledge:  Poor  Language:  Poor  Akathisia:  No  Handed:  Right  AIMS (if indicated):     Assets:  Desire for Improvement Housing Resilience Social Support  ADL's:  Impaired  Cognition:  Impaired,  Moderate  Sleep:  Number of Hours: 8     Treatment Plan Summary: Daily contact with patient to assess and evaluate symptoms and progress in treatment, Medication management and Plan Patient still showing some gradual improvement with encouragement.  Social work doing a good job and trying to find appropriate discharge plans.  Family needs some time to be ready for discharge and will probably be prepared by Monday.  Psychoeducation with the patient and encouragement.  Mordecai RasmussenJohn Clapacs, MD 03/01/2019, 6:45 PM

## 2019-03-01 NOTE — Progress Notes (Signed)
2pm-3pm:Patient laying in bed; eyes open; smiling talking with MHT. No concerns expressed.  3pm-4PM: Patient laying in bed; eyes open staring out the window, no distress noted. MHT within reach.  4PM-5PM: patient walked to dinning area and ate dinner, no distress, came back to room, reported no concerns, MHT continues to be within reach.  5-6 PM: Patient in bed eyes open, smiling, denies pain, no concerns expressed. MHT with in reach.  6pm-7 pm: Patient laying in bed, eyes closed, breath even and unlabored. No distress noted. MHT within reach.

## 2019-03-02 NOTE — Progress Notes (Signed)
Patient alert and oriented x 3 with periods of confusion to situation, affect is flat, he brightens upon approach,  no distress noted, interacting appropriately with staff, continues to be on 1:1 for fall risk, his thoughts are organized and coherent, he was noted ambulating the unit with safety sitter beside him. Patient was offered emotional support this evening , he received a call from his father and he communicated  effectively with him. Patient denies SI/HI/AVH 15 minutes safety checks maintained will continue to monitor.

## 2019-03-02 NOTE — Progress Notes (Signed)
Patient in bed with eyes closed safety sitter at bedside, no distress noted ; respiration even none labored, 15 minutes safety checks maintained will continue to monitor.

## 2019-03-02 NOTE — Plan of Care (Signed)
  Problem: Safety: Goal: Periods of time without injury will increase Outcome: Progressing  Patient on the unit no injury noted, sitter at bedside.

## 2019-03-02 NOTE — Progress Notes (Signed)
Recreation Therapy Notes   Date: 03/02/2019  Time: 9:30 am   Location: Craft room   Behavioral response: N/A   Intervention Topic: Relaxation  Discussion/Intervention: Patient did not attend group.   Clinical Observations/Feedback:  Patient did not attend group.   Fabyan Loughmiller LRT/CTRS        Edee Nifong 03/02/2019 11:27 AM 

## 2019-03-02 NOTE — [facility] Counselor (Signed)
Pt is scheduled to discharge on 6/22 to his parent's home. Parents request referral for pt to participate in [facility] program. CSW discussed this plan with pt and when asked would he be willing to participate in the program he said no. CSW discussed with pt the potential benefits of the program and he reiterated no. CSW unable to get consents signed at this time.

## 2019-03-02 NOTE — Progress Notes (Signed)
[facility] MD Progress Note  03/02/2019 2:16 PM Troy Fox  MRN:  [id number] Subjective: Patient seen and chart reviewed.  Patient is able to sit up in bed and eat on his own.  He goes to the bathroom by himself.  He is able to ambulate through the hallway to groups and sit through them although he cannot really participate.  He has no actual complaints of his own.  When I went to see him this afternoon however he was awake.  Very flat as usual in his affect and speech.  Denied any suicidal thoughts.  Expressed some enthusiasm at going home.  Family has requested that we plan discharge for Monday.  No side effects to medicine.  Blood sugars stable. Principal Problem: Schizoaffective disorder, depressive type ([place]) Diagnosis: Principal Problem:   Schizoaffective disorder, depressive type ([place]) Active Problems:   Catatonia   Mild malnutrition ([place])  Total Time spent with patient: 30 minutes  Past Psychiatric History: Patient has a long history of schizoaffective disorder.  Recent response to ECT from a catatonic spell.  Past Medical History:  Past Medical History:  Diagnosis Date  . Bipolar 1 disorder ([place])   . 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[place])   . GERD (gastroesophageal reflux disease)   . Nonverbal   . Schizoaffective disorder ([place])   . Tardive dyskinesia    History reviewed. No pertinent surgical history. Family History:  Family History  Adopted: Yes  Family history unknown: Yes   Family Psychiatric  History: See previous Social History:  Social History   Substance and Sexual Activity  Alcohol Use Never  . Frequency: Never     Social History   Substance and Sexual Activity  Drug Use Never    Social History   Socioeconomic History  . Marital status: Single    Spouse name: Not on file  . Number of children: Not on file  . Years of education: Not on file  . Highest education level: Not on file  Occupational History  . Not on file  Social Needs  . Financial resource strain:  Not on file  . Food insecurity    Worry: Not on file    Inability: Not on file  . Transportation needs    Medical: Not on file    Non-medical: Not on file  Tobacco Use  . Smoking status: Never Smoker  . Smokeless tobacco: Never Used  Substance and Sexual Activity  . Alcohol use: Never    Frequency: Never  . Drug use: Never  . Sexual activity: Not Currently  Lifestyle  . Physical activity    Days per week: Not on file    Minutes per session: Not on file  . Stress: Not on file  Relationships  . Social Herbalist on phone: Not on file    Gets together: Not on file    Attends religious service: Not on file    Active member of club or organization: Not on file    Attends meetings of clubs or organizations: Not on file    Relationship status: Not on file  Other Topics Concern  . Not on file  Social History Narrative  . Not on file   Additional Social History:    Pain Medications: please see mar Prescriptions: please see mar Over the Counter: please see mar History of alcohol / drug use?: No history of alcohol / drug abuse Longest period of sobriety (when/how long): NA  Sleep: Fair  Appetite:  Fair  Current Medications: Current Facility-Administered Medications  Medication Dose Route Frequency Provider Last Rate Last Dose  . acetaminophen (TYLENOL) tablet 650 mg  650 mg Oral Q6H PRN Cypher Paule T, MD      . alum & mag hydroxide-simeth (MAALOX/MYLANTA) 200-200-20 MG/5ML suspension 30 mL  30 mL Oral Q4H PRN Kimbree Casanas T, MD      . FLUoxetine (PROZAC) capsule 40 mg  40 mg Oral Daily Baleria Wyman, Jackquline DenmarkJohn T, MD   40 mg at 03/02/19 0806  . magnesium hydroxide (MILK OF MAGNESIA) suspension 30 mL  30 mL Oral Daily PRN Zakk Borgen T, MD      . OLANZapine zydis (ZYPREXA) disintegrating tablet 10 mg  10 mg Oral QHS Saba Gomm T, MD   10 mg at 03/01/19 2200  . pantoprazole (PROTONIX) EC tablet 40 mg  40 mg Oral Daily Ajani Rineer, Jackquline DenmarkJohn T, MD   40  mg at 03/02/19 0806  . polyethylene glycol (MIRALAX / GLYCOLAX) packet 17 g  17 g Oral Daily Marzelle Rutten, Jackquline DenmarkJohn T, MD   17 g at 03/02/19 [id number]  . traZODone (DESYREL) tablet 100 mg  100 mg Oral QHS Nuriyah Hanline T, MD   100 mg at 03/01/19 2200  . vitamin B-12 (CYANOCOBALAMIN) tablet 1,000 mcg  1,000 mcg Oral Daily Julianna Vanwagner, Jackquline DenmarkJohn T, MD   1,000 mcg at 03/02/19 [id number]    Lab Results: No results found for this or any previous visit (from the past 48 hour(s)).  Blood Alcohol level:  Lab Results  Component Value Date   ETH <10 01/24/2019    Metabolic Disorder Labs: Lab Results  Component Value Date   HGBA1C 4.9 01/30/2019   MPG 93.93 01/30/2019   No results found for: PROLACTIN No results found for: CHOL, TRIG, HDL, CHOLHDL, VLDL, LDLCALC  Physical Findings: AIMS: Facial and Oral Movements Muscles of Facial Expression: None, normal Lips and Perioral Area: None, normal Jaw: None, normal Tongue: None, normal,Extremity Movements Upper (arms, wrists, hands, fingers): None, normal Lower (legs, knees, ankles, toes): None, normal, Trunk Movements Neck, shoulders, hips: None, normal, Overall Severity Severity of abnormal movements (highest score from questions above): None, normal Incapacitation due to abnormal movements: None, normal Patient's awareness of abnormal movements (rate only patient's report): No Awareness, Dental Status Current problems with teeth and/or dentures?: No Does patient usually wear dentures?: No  CIWA:    COWS:     Musculoskeletal: Strength & Muscle Tone: within normal limits Gait & Station: normal Patient leans: N/A  Psychiatric Specialty Exam: Physical Exam  Nursing note and vitals reviewed. Constitutional: He appears well-developed and well-nourished.  HENT:  Head: Normocephalic and atraumatic.  Eyes: Pupils are equal, round, and reactive to light. Conjunctivae are normal.  Neck: Normal range of motion.  Cardiovascular: Normal heart sounds.  Respiratory:  Effort normal.  GI: Soft.  Musculoskeletal: Normal range of motion.  Neurological: He is alert.  Skin: Skin is warm and dry.  Psychiatric: Judgment normal. His affect is blunt. His speech is delayed and tangential. He is slowed. Cognition and memory are impaired. He expresses no homicidal and no suicidal ideation.    Review of Systems  Constitutional: Negative.   HENT: Negative.   Eyes: Negative.   Respiratory: Negative.   Cardiovascular: Negative.   Gastrointestinal: Negative.   Musculoskeletal: Negative.   Skin: Negative.   Neurological: Negative.   Psychiatric/Behavioral: Negative.     Blood pressure (!) 103/59, pulse (!) 52, temperature 97.7 F (36.5 C), temperature source  Oral, resp. rate 16, height 5\' 5"  (1.651 m), weight 62.4 kg, SpO2 96 %.Body mass index is 22.88 kg/m.  General Appearance: Casual  Eye Contact:  Minimal  Speech:  Slow  Volume:  Decreased  Mood:  Euthymic  Affect:  Flat  Thought Process:  Coherent  Orientation:  Full (Time, Place, and Person)  Thought Content:  Tangential  Suicidal Thoughts:  No  Homicidal Thoughts:  No  Memory:  Immediate;   Fair Recent;   Poor Remote;   Poor  Judgement:  Impaired  Insight:  Shallow  Psychomotor Activity:  Decreased  Concentration:  Concentration: Fair  Recall:  F[employer]Fair  Fund of Knowledge:  Fair  Language:  Fair  Akathisia:  No  Handed:  Right  AIMS (if indicated):     Assets:  Health and safety inspectorinancial Resources/Insurance Housing Physical Health Resilience  ADL's:  Impaired  Cognition:  Impaired,  Mild  Sleep:  Number of Hours: 5     Treatment Plan Summary: Daily contact with patient to assess and evaluate symptoms and progress in treatment, Medication management and Plan On reflection I think what we are seeing now really is mainly negative symptoms of schizophrenia.  He does not actually appear to be depressed.  Does not express any negative thoughts or hopelessness.  He is cooperative as long as he receives specific  encouragement to do something.  Also does not seem to be medical delirium.  I tend to agree with the reviewer who suggested that probably clozapine would be a good idea but since we are planning for discharge on Monday it is too late for us to make a real start on that.  No change to medicine today.  Likely discharge on Monday.  Patient aware.  Mordecai RasmussenJohn Webber Michiels, MD 03/02/2019, 2:16 PM

## 2019-03-02 NOTE — Progress Notes (Signed)
Patient safety maintained during entire shift, patient continued to be on 1:1 due to unsteadiness in gait. Patient came out of room today to go eat lunch and dinner in the dinning area with other peers. Patient did not complain of any pain during the day, patient received adequate toileting. During the entire day patient remained safe with 1:1 monitoring. No concerns or issues observed.

## 2019-03-02 NOTE — [facility] Group Notes (Signed)

## 2019-03-02 NOTE — Progress Notes (Signed)
Patient's condition unchanged no distress noted, in bed with eyes closed, safety sitter at bedside will continue to monitor.

## 2019-03-02 NOTE — Plan of Care (Signed)
Patient is alert and oriented to self and place and situation. Patient denies SI, HI and AVH. Patient is now able to ambulate on his own, feed self, and is no longer incontinent of stool or urine. Patient continues to have 1:1 due to some unsteadiness of gait at intermittent times. Patient voices no concerns, states he slept well, appetite is good, and no pain 0/10.   Problem: Education: Goal: Knowledge of [facility] Education information/materials will improve Outcome: Progressing Goal: Emotional status will improve Outcome: Progressing Goal: Mental status will improve Outcome: Progressing Goal: Verbalization of understanding the information provided will improve Outcome: Progressing   Problem: Activity: Goal: Interest or engagement in activities will improve Outcome: Progressing Goal: Sleeping patterns will improve Outcome: Progressing   Problem: Coping: Goal: Ability to verbalize frustrations and anger appropriately will improve Outcome: Progressing Goal: Ability to demonstrate self-control will improve Outcome: Progressing

## 2019-03-03 NOTE — Progress Notes (Signed)
[facility]  03/03/2019 12:41 PM Troy DuhamelStephen Fox  MRN:  [id number] Subjective: Follow-up patient with schizoaffective disorder.  Patient shows more energy every day.  Gets up and walks without assistance around the unit.  Makes good eye contact answers questions in full sentences.  Denies suicidal thoughts denies hallucinations.  Denies feeling depressed. Principal Problem: Schizoaffective disorder, depressive type (HCC) Diagnosis: Principal Problem:   Schizoaffective disorder, depressive type (HCC) Active Problems:   Catatonia   Mild malnutrition (HCC)  Total Time spent with patient: 30 minutes  Past Psychiatric History: Patient has a history of schizoaffective disorder recent good response to ECT from a catatonic spell  Past Medical History:  Past Medical History:  Diagnosis Date  . Bipolar 1 disorder (HCC)   . 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 GERD (gastroesophageal reflux disease)   . Nonverbal   . Schizoaffective disorder (HCC)   . Tardive dyskinesia    History reviewed. No pertinent surgical history. Family History:  Family History  Adopted: Yes  Family history unknown: Yes   Family Psychiatric  History: See previous Social History:  Social History   Substance and Sexual Activity  Alcohol Use Never  . Frequency: Never     Social History   Substance and Sexual Activity  Drug Use Never    Social History   Socioeconomic History  . Marital status: Single    Spouse name: Not on file  . Number of children: Not on file  . Years of education: Not on file  . Highest education level: Not on file  Occupational History  . Not on file  Social Needs  . Financial resource strain: Not on file  . Food insecurity    Worry: Not on file    Inability: Not on file  . Transportation needs    Medical: Not on file    Non-medical: Not on file  Tobacco Use  . Smoking status: Never Smoker  . Smokeless tobacco: Never Used  Substance and Sexual  Activity  . Alcohol use: Never    Frequency: Never  . Drug use: Never  . Sexual activity: Not Currently  Lifestyle  . Physical activity    Days per week: Not on file    Minutes per session: Not on file  . Stress: Not on file  Relationships  . Social Musicianconnections    Talks on phone: Not on file    Gets together: Not on file    Attends religious service: Not on file    Active member of club or organization: Not on file    Attends meetings of clubs or organizations: Not on file    Relationship status: Not on file  Other Topics Concern  . Not on file  Social History Narrative  . Not on file   Additional Social History:    Pain Medications: please see mar Prescriptions: please see mar Over the Counter: please see mar History of alcohol / drug use?: No history of alcohol / drug abuse Longest period of sobriety (when/how long): NA                    Sleep: Fair  Appetite:  Fair  Current Medications: Current Facility-Administered Medications  Medication Dose Route Frequency Provider Last Rate Last Dose  . acetaminophen (TYLENOL) tablet 650 mg  650 mg Oral Q6H PRN ,  T, MD      . alum & mag hydroxide-simeth (MAALOX/MYLANTA) 200-200-20 MG/5ML suspension 30 mL  30 mL Oral Q4H PRN ,  T, MD      . FLUoxetine (PROZAC) capsule 40 mg  40 mg Oral Daily , Jackquline Denmark T, MD   40 mg at 03/03/19 0834  . magnesium hydroxide (MILK OF MAGNESIA) suspension 30 mL  30 mL Oral Daily PRN ,  T, MD      . OLANZapine zydis (ZYPREXA) disintegrating tablet 10 mg  10 mg Oral QHS , Jackquline Denmark T, MD   10 mg at 03/02/19 2238  . pantoprazole (PROTONIX) EC tablet 40 mg  40 mg Oral Daily , Jackquline Denmark T, MD   40 mg at 03/03/19 0834  . polyethylene glycol (MIRALAX / GLYCOLAX) packet 17 g  17 g Oral Daily , Jackquline Denmark T, MD   17 g at 03/03/19 0835  . traZODone (DESYREL) tablet 100 mg  100 mg Oral QHS , Jackquline Denmark T, MD   100 mg at 03/02/19 2237  . vitamin B-12  (CYANOCOBALAMIN) tablet 1,000 mcg  1,000 mcg Oral Daily , Jackquline Denmark T, MD   1,000 mcg at 03/03/19 [id number]    Lab Results: No results found for this or any previous visit (from the past 48 hour(s)).  Blood Alcohol level:  Lab Results  Component Value Date   ETH <10 01/24/2019    Metabolic Disorder Labs: Lab Results  Component Value Date   HGBA1C 4.9 01/30/2019   MPG 93.93 01/30/2019   No results found for: PROLACTIN No results found for: CHOL, TRIG, HDL, CHOLHDL, VLDL, LDLCALC  Physical Findings: AIMS: Facial and Oral Movements Muscles of Facial Expression: None, normal Lips and Perioral Area: None, normal Jaw: None, normal Tongue: None, normal,Extremity Movements Upper (arms, wrists, hands, fingers): None, normal Lower (legs, knees, ankles, toes): None, normal, Trunk Movements Neck, shoulders, hips: None, normal, Overall Severity Severity of abnormal movements (highest score from questions above): None, normal Incapacitation due to abnormal movements: None, normal Patient's awareness of abnormal movements (rate only patient's report): No Awareness, Dental Status Current problems with teeth and/or dentures?: No Does patient usually wear dentures?: No  CIWA:    COWS:     Musculoskeletal: Strength & Muscle Tone: within normal limits Gait & Station: normal Patient leans: N/A  Psychiatric Specialty Exam: Physical Exam  Nursing note and vitals reviewed. Constitutional: He appears well-developed and well-nourished.  HENT:  Head: Normocephalic and atraumatic.  Eyes: Pupils are equal, round, and reactive to light. Conjunctivae are normal.  Neck: Normal range of motion.  Cardiovascular: Normal heart sounds.  Respiratory: Effort normal.  GI: Soft.  Musculoskeletal: Normal range of motion.  Neurological: He is alert.  Skin: Skin is warm and dry.  Psychiatric: His affect is blunt. His speech is delayed. He is slowed. Thought content is not paranoid. Cognition and memory are  impaired. He expresses no homicidal and no suicidal ideation.    Review of Systems  Constitutional: Negative.   HENT: Negative.   Eyes: Negative.   Respiratory: Negative.   Cardiovascular: Negative.   Gastrointestinal: Negative.   Musculoskeletal: Negative.   Skin: Negative.   Neurological: Negative.   Psychiatric/Behavioral: Negative.     Blood pressure 124/88, pulse 74, temperature 97.7 F (36.5 C), temperature source Oral, resp. rate 16, height 5\' 5"  (1.651 m), weight 62.4 kg, SpO2 98 %.Body mass index is 22.88 kg/m.  General Appearance: Casual  Eye Contact:  Fair  Speech:  Slow  Volume:  Decreased  Mood:  Euthymic  Affect:  Flat  Thought Process:  Coherent  Orientation:  Full (Time, Place, and  Person)  Thought Content:  Rumination  Suicidal Thoughts:  No  Homicidal Thoughts:  No  Memory:  Immediate;   Fair Recent;   Fair Remote;   Fair  Judgement:  Fair  Insight:  Fair  Psychomotor Activity:  Decreased  Concentration:  Concentration: Fair  Recall:  [employer] of Knowledge:  Fair  Language:  Fair  Akathisia:  No  Handed:  Right  AIMS (if indicated):     Assets:  Desire for Improvement Housing Physical Health  ADL's:  Impaired  Cognition:  Impaired,  Moderate  Sleep:  Number of Hours: 7.3     Treatment Plan Summary: Daily contact with patient to assess and evaluate symptoms and progress in treatment, Medication management and Plan Gradually improving.  Does not seem to be depressed so much is having a lot of negative symptoms of schizophrenia.  He will get active when encouraged.  No indication today to change any medicine we are still looking forward to discharge on Monday.  Alethia Berthold, MD 03/03/2019, 12:41 PM

## 2019-03-03 NOTE — Progress Notes (Signed)
1:1 Observation Note  Patient at this time observed in his room resting with 1:1 present for safety. No distress noted. Will continue to monitor.  

## 2019-03-03 NOTE — Progress Notes (Signed)
1:1 Observation Note  Patient at this time observed in his room resting with 1:1 present for safety. No distress noted. Will continue to monitor.

## 2019-03-03 NOTE — Plan of Care (Signed)
  Problem: Health Behavior/Discharge Planning: Goal: Compliance with treatment plan for underlying cause of condition will improve Outcome: Progressing  Patient is complaint with treatment  plan he tolerated and completed ECT treatment and takes medication.

## 2019-03-03 NOTE — Progress Notes (Addendum)
D: Pt. During assessments endorses a normal mood. Pt. Denies si/hi/avh, able to contract for safety. Pt. Reports doing better overall, but interaction is minimal. Pt. Forwards little. Pt. Eye contact is brief. Pt. Appears to be presenting with thought blocking to a mild degree.    A: Q x 4 hour observation checks to be completed for safety. Patient was provided with education.  Patient was given/offered medications per orders. Patient  was encouraged to attend groups, participate in unit activities and continue with plan of care. Pt. Chart and plans of care reviewed. Pt. Given support and encouragement.   R: Patient is complaint with medications and breakfast this morning. Pt. Observed eating fair this morning. Pt. Is isolative and withdrawn to bedroom mostly. Pt. Staff and peers interaction minimal. Pt. Ambulation has improved, but still a high falls risk and nursing judgement to have patient on 1:1 recommended/implemented.

## 2019-03-03 NOTE — [facility] Group Notes (Signed)
LCSW Group Therapy Note  03/03/2019  1:00 PM   Type of Therapy and Topic:  Group Therapy:  Trust and Honesty   Participation Level:  Did Not Attend   Description of Group:    In this group patients will be asked to explore the value of being honest.  Patients will be guided to discuss their thoughts, feelings, and behaviors related to honesty and trusting in others. Patients will process together how trust and honesty relate to forming relationships with peers, family members, and self. Each patient will be challenged to identify and express feelings of being vulnerable. Patients will discuss reasons why people are dishonest and identify alternative outcomes if one was truthful (to self or others). This group will be process-oriented, with patients participating in exploration of their own experiences, giving and receiving support, and processing challenge from other group members.   Therapeutic Goals: 1. Patient will identify why honesty is important to relationships and how honesty overall affects relationships.  2. Patient will identify a situation where they lied or were lied too and the  feelings, thought process, and behaviors surrounding the situation 3. Patient will identify the meaning of being vulnerable, how that feels, and how that correlates to being honest with self and others. 4. Patient will identify situations where they could have told the truth, but instead lied and explain reasons of dishonesty.   Summary of Patient Progress:  X    Therapeutic Modalities:   Cognitive Behavioral Therapy Solution Focused Therapy Motivational Interviewing Brief Therapy  Assunta Curtis, MSW, LCSW 03/03/2019 12:26 PM

## 2019-03-03 NOTE — Plan of Care (Signed)
Pt. Endorses a normal mood. Pt. Reports he is improving overall. Pt. Participation is poor. Pt. Is mostly isolative and withdrawn to room resting with 1:1 present for safety.    Problem: Education: Goal: Emotional status will improve Outcome: Progressing Goal: Mental status will improve Outcome: Progressing   Problem: Safety: Goal: Periods of time without injury will increase Outcome: Progressing   Problem: Activity: Goal: Interest or engagement in activities will improve Outcome: Not Progressing

## 2019-03-03 NOTE — Progress Notes (Signed)
Patient alert and oriented x 4  affect is flat, he brightens upon approach, interacting appropriately with staff, continues to be on 1:1 for fall risk, his thoughts are organized and coherent, he ambulated to the day room this shift with sitter beside him. Patient was offered emotional support this evening , he received a call from his father and he communicated  effectively with him. Patient denies SI/HI/AVH 15 minutes safety checks maintained will continue to monitor

## 2019-03-04 MED ORDER — CYANOCOBALAMIN 1000 MCG PO TABS: 1000.0000 ug | ORAL_TABLET | Freq: Every day | ORAL | 0 refills | Status: DC

## 2019-03-04 MED ORDER — OLANZAPINE 10 MG PO TBDP: 10.0000 mg | ORAL_TABLET | Freq: Every day | ORAL | 0 refills | Status: DC

## 2019-03-04 MED ORDER — FLUOXETINE HCL 40 MG PO CAPS: 40.0000 mg | ORAL_CAPSULE | Freq: Every day | ORAL | 0 refills | Status: DC

## 2019-03-04 MED ORDER — TRAZODONE HCL 100 MG PO TABS: 100.0000 mg | ORAL_TABLET | Freq: Every day | ORAL | 0 refills | Status: DC

## 2019-03-04 MED ORDER — PANTOPRAZOLE SODIUM 40 MG PO TBEC: 40.0000 mg | DELAYED_RELEASE_TABLET | Freq: Every day | ORAL | 0 refills | Status: DC

## 2019-03-04 NOTE — Progress Notes (Signed)
Patient is stating that he still wants to sleep and would rather take his morning medication after he gets up and eats something. This writer will administer patient's medications when patient gets up. This Probation officer notified MD.

## 2019-03-04 NOTE — [facility] Suicide Risk Assessment (Signed)
[facility] Discharge Suicide Risk Assessment   Principal Problem: Schizoaffective disorder, depressive type [facility]) Discharge Diagnoses: Principal Problem:   Schizoaffective disorder, depressive type ([place]) Active Problems:   Catatonia   Mild malnutrition ([place])   Total Time spent with patient: 45 minutes  Musculoskeletal: Strength & Muscle Tone: within normal limits Gait & Station: normal Patient leans: N/A  Psychiatric Specialty Exam: Review of Systems  Constitutional: Negative.   HENT: Negative.   Eyes: Negative.   Respiratory: Negative.   Cardiovascular: Negative.   Gastrointestinal: Negative.   Musculoskeletal: Negative.   Skin: Negative.   Neurological: Negative.   Psychiatric/Behavioral: Negative.     Blood pressure 96/60, pulse 91, temperature 97.6 F (36.4 C), temperature source Oral, resp. rate 17, height 5\' 5"  (1.651 m), weight 62.4 kg, SpO2 99 %.Body mass index is 22.88 kg/m.  General Appearance: Casual  Eye Contact::  Minimal  Speech:  Slow409  Volume:  Decreased  Mood:  Euthymic  Affect:  Blunt  Thought Process:  Disorganized  Orientation:  Full (Time, Place, and Person)  Thought Content:  NA  Suicidal Thoughts:  No  Homicidal Thoughts:  No  Memory:  Immediate;   Fair Recent;   Fair Remote;   Fair  Judgement:  Fair  Insight:  Shallow  Psychomotor Activity:  Decreased  Concentration:  Fair  Recall:  [employer] of Knowledge:Fair  Language: Fair  Akathisia:  No  Handed:  Right  AIMS (if indicated):     Assets:  Desire for Improvement Physical Health Resilience Social Support  Sleep:  Number of Hours: 5.25  Cognition: Impaired,  Moderate  ADL's:  Impaired   Mental Status Per Nursing Assessment::   On Admission:  NA  Demographic Factors:  Caucasian  Loss Factors: Decline in physical health  Historical Factors: NA  Risk Reduction Factors:   Living with another person, especially a relative, Positive social support and Positive therapeutic  relationship  Continued Clinical Symptoms:  Schizophrenia:   Paranoid or undifferentiated type  Cognitive Features That Contribute To Risk:  Loss of executive function    Suicide Risk:  Minimal: No identifiable suicidal ideation.  Patients presenting with no risk factors but with morbid ruminations; may be classified as minimal risk based on the severity of the depressive symptoms  Follow-up Information    Services, [facility] Follow up.   Why: Please follow up with Delrae Alfred to enroll in their  [facility] Program. They will do an assessment by phone, schedule an appointment with a therapist.  Program hours are Monday- Friday 8:00- 4:00.  Contact [facility] at [phone] for transportation.    Contact information: [place] [address] [phone]           Plan Of Care/Follow-up recommendations:  Activity:  Patient will be engaged in activity as tolerated with a lot of encouragement to become more physically active Diet:  Regular Other:  Follow-up with outpatient care in the [facility] program 3-day mark  Alethia Berthold, MD 03/04/2019, 12:31 PM

## 2019-03-04 NOTE — [facility] Group Notes (Signed)
LCSW Group Therapy Note  03/04/2019 9:08 AM   Type of Therapy and Topic: Group Therapy: Feelings Around Returning Home & Establishing a Supportive Framework and Supporting Oneself When Supports Not Available   Participation Level:  Did Not Attend   Description of Group:  Patients first processed thoughts and feelings about upcoming discharge. These included fears of upcoming changes, lack of change, new living environments, judgements and expectations from others and overall stigma of mental health issues. The group then discussed the definition of a supportive framework, what that looks and feels like, and how do to discern it from an unhealthy non-supportive network. The group identified different types of supports as well as what to do when your family/friends are less than helpful or unavailable   Therapeutic Goals  1. Patient will identify one healthy supportive network that they can use at discharge. 2. Patient will identify one factor of a supportive framework and how to tell it from an unhealthy network. 3. Patient able to identify one coping skill to use when they do not have positive supports from others. 4. Patient will demonstrate ability to communicate their needs through discussion and/or role plays.   Summary of Patient Progress:  x    Therapeutic Modalities Cognitive Behavioral Therapy Motivational Interviewing    Misael Mcgaha, MSW, LCSW Clinical Social Work 03/04/2019 9:08 AM    

## 2019-03-04 NOTE — Progress Notes (Signed)
1:1 observation 1900-2300 D: Patient has been up ambulating around the unit without assistance. MHT within reach. Denies SI, HI and AVH.  A: Continue 1:1 for safety. R: Safety maintained. 0000-0400 D: Patient out of room more, ambulating on his own. Denies SI, HI and AVH. MHT within reach. Slept during the night. A: continue 1:1 for safety. R: Safety maintained. 0400-0700 D: Patient slept. Denies SI, HI and AVH. MHT within reach A: continue 1:1 for safety. R: Safety maintained.

## 2019-03-04 NOTE — Progress Notes (Signed)
[facility]  03/04/2019 12:24 PM Troy DuhamelStephen Fox  MRN:  [id number] Subjective: Follow-up for this patient with schizoaffective disorder.  Responded to ECT with resolution of catatonia but remains pretty blank and dull and withdrawn.  Patient is awake when I came to see him and was able to engage in conversation.  Answer questions and only a few words.  Denied any thoughts at all of harming himself.  Denied any hallucinations.  Has not been showing any signs of bizarre behavior or hallucinations.  No signs of acute dangerousness although he remains pretty blunted and withdrawn.  He cannot really engage in any conversation about any of it.  He has been cooperative with medicine.  We have been planning for days on discharge back to his home with his family with likely discharge tomorrow. Principal Problem: Schizoaffective disorder, depressive type (HCC) Diagnosis: Principal Problem:   Schizoaffective disorder, depressive type (HCC) Active Problems:   Catatonia   Mild malnutrition (HCC)  Total Time spent with patient: 30 minutes  Past Psychiatric History: Patient has a history of longstanding psychotic disorder recent catatonia.  Past Medical History:  Past Medical History:  Diagnosis Date  . Bipolar 1 disorder (HCC)   . 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 GERD (gastroesophageal reflux disease)   . Nonverbal   . Schizoaffective disorder (HCC)   . Tardive dyskinesia    History reviewed. No pertinent surgical history. Family History:  Family History  Adopted: Yes  Family history unknown: Yes   Family Psychiatric  History: None known Social History:  Social History   Substance and Sexual Activity  Alcohol Use Never  . Frequency: Never     Social History   Substance and Sexual Activity  Drug Use Never    Social History   Socioeconomic History  . Marital status: Single    Spouse name: Not on file  . Number of children: Not on file  . Years of education: Not on file  .  Highest education level: Not on file  Occupational History  . Not on file  Social Needs  . Financial resource strain: Not on file  . Food insecurity    Worry: Not on file    Inability: Not on file  . Transportation needs    Medical: Not on file    Non-medical: Not on file  Tobacco Use  . Smoking status: Never Smoker  . Smokeless tobacco: Never Used  Substance and Sexual Activity  . Alcohol use: Never    Frequency: Never  . Drug use: Never  . Sexual activity: Not Currently  Lifestyle  . Physical activity    Days per week: Not on file    Minutes per session: Not on file  . Stress: Not on file  Relationships  . Social Musicianconnections    Talks on phone: Not on file    Gets together: Not on file    Attends religious service: Not on file    Active member of club or organization: Not on file    Attends meetings of clubs or organizations: Not on file    Relationship status: Not on file  Other Topics Concern  . Not on file  Social History Narrative  . Not on file   Additional Social History:    Pain Medications: please see mar Prescriptions: please see mar Over the Counter: please see mar History of alcohol / drug use?: No history of alcohol / drug abuse Longest period of sobriety (when/how long): NA  Sleep: Fair  Appetite:  Fair  Current Medications: Current Facility-Administered Medications  Medication Dose Route Frequency Provider Last Rate Last Dose  . acetaminophen (TYLENOL) tablet 650 mg  650 mg Oral Q6H PRN Jozlin Bently T, MD      . alum & mag hydroxide-simeth (MAALOX/MYLANTA) 200-200-20 MG/5ML suspension 30 mL  30 mL Oral Q4H PRN Mossie Gilder T, MD      . FLUoxetine (PROZAC) capsule 40 mg  40 mg Oral Daily Ghadeer Kastelic T, MD   40 mg at 03/04/19 1150  . magnesium hydroxide (MILK OF MAGNESIA) suspension 30 mL  30 mL Oral Daily PRN Siya Flurry T, MD      . OLANZapine zydis (ZYPREXA) disintegrating tablet 10 mg  10 mg Oral QHS Javon Hupfer,  Madie Reno, MD   10 mg at 03/03/19 2135  . pantoprazole (PROTONIX) EC tablet 40 mg  40 mg Oral Daily Aynslee Mulhall T, MD   40 mg at 03/04/19 1150  . polyethylene glycol (MIRALAX / GLYCOLAX) packet 17 g  17 g Oral Daily Makai Dumond T, MD   17 g at 03/04/19 1149  . traZODone (DESYREL) tablet 100 mg  100 mg Oral QHS Lilyonna Steidle, Madie Reno, MD   100 mg at 03/03/19 2134  . vitamin B-12 (CYANOCOBALAMIN) tablet 1,000 mcg  1,000 mcg Oral Daily Debar Plate, Madie Reno, MD   1,000 mcg at 03/04/19 1150    Lab Results: No results found for this or any previous visit (from the past 48 hour(s)).  Blood Alcohol level:  Lab Results  Component Value Date   ETH <10 40/98/1191    Metabolic Disorder Labs: Lab Results  Component Value Date   HGBA1C 4.9 01/30/2019   MPG 93.93 01/30/2019   No results found for: PROLACTIN No results found for: CHOL, TRIG, HDL, CHOLHDL, VLDL, LDLCALC  Physical Findings: AIMS: Facial and Oral Movements Muscles of Facial Expression: None, normal Lips and Perioral Area: None, normal Jaw: None, normal Tongue: None, normal,Extremity Movements Upper (arms, wrists, hands, fingers): None, normal Lower (legs, knees, ankles, toes): None, normal, Trunk Movements Neck, shoulders, hips: None, normal, Overall Severity Severity of abnormal movements (highest score from questions above): None, normal Incapacitation due to abnormal movements: None, normal Patient's awareness of abnormal movements (rate only patient's report): No Awareness, Dental Status Current problems with teeth and/or dentures?: No Does patient usually wear dentures?: No  CIWA:    COWS:     Musculoskeletal: Strength & Muscle Tone: decreased Gait & Station: normal Patient leans: N/A  Psychiatric Specialty Exam: Physical Exam  Nursing note and vitals reviewed. Constitutional: He appears well-developed and well-nourished.  HENT:  Head: Normocephalic and atraumatic.  Eyes: Pupils are equal, round, and reactive to light.  Conjunctivae are normal.  Neck: Normal range of motion.  Cardiovascular: Regular rhythm and normal heart sounds.  Respiratory: Effort normal.  GI: Soft.  Musculoskeletal: Normal range of motion.  Neurological: He is alert.  Skin: Skin is warm and dry.  Psychiatric: His affect is blunt. His speech is delayed. He is slowed and withdrawn. Cognition and memory are impaired. He expresses inappropriate judgment. He expresses no homicidal and no suicidal ideation.    Review of Systems  Constitutional: Negative.   HENT: Negative.   Eyes: Negative.   Respiratory: Negative.   Cardiovascular: Negative.   Gastrointestinal: Negative.   Musculoskeletal: Negative.   Skin: Negative.   Neurological: Negative.   Psychiatric/Behavioral: Negative for depression, hallucinations, memory loss, substance abuse and suicidal ideas. The patient is not nervous/anxious  and does not have insomnia.     Blood pressure 96/60, pulse 91, temperature 97.6 F (36.4 C), temperature source Oral, resp. rate 17, height 5\' 5"  (1.651 m), weight 62.4 kg, SpO2 99 %.Body mass index is 22.88 kg/m.  General Appearance: Casual  Eye Contact:  Minimal  Speech:  Slow  Volume:  Decreased  Mood:  Euthymic  Affect:  Constricted and Flat  Thought Process:  Coherent  Orientation:  Full (Time, Place, and Person)  Thought Content:  NA  Suicidal Thoughts:  No  Homicidal Thoughts:  No  Memory:  Immediate;   Fair Recent;   Fair Remote;   Fair  Judgement:  Impaired  Insight:  Shallow  Psychomotor Activity:  Decreased  Concentration:  Concentration: Poor  Recall:  FiservFair  Fund of Knowledge:  Fair  Language:  Fair  Akathisia:  No  Handed:  Right  AIMS (if indicated):     Assets:  Desire for Improvement Housing Physical Health  ADL's:  Impaired  Cognition:  Impaired,  Moderate  Sleep:  Number of Hours: 5.25     Treatment Plan Summary: Daily contact with patient to assess and evaluate symptoms and progress in treatment,  Medication management and Plan We have been planning a discharge for tomorrow.  He will be going back to his family's home and we are arranging for him to have programming in [place].  I am going to go ahead and make plans so that he will be ready for discharge tomorrow.  Patient has been informed of this.  He has also been informed multiple times that he needs to be getting up and taking more responsibility for activity but as it is almost impossible to engage in conversation with him I do not know how much affect we are having.  If he is not able to improve beyond this he may ultimately need to go to a nursing facility.  At this point however I think it safe for us to plan for discharge tomorrow.  Mordecai RasmussenJohn Dollie Bressi, MD 03/04/2019, 12:24 PM

## 2019-03-04 NOTE — Plan of Care (Signed)
  Problem: Education: Goal: Knowledge of [place] Education information/materials will improve Outcome: Progressing Goal: Emotional status will improve Outcome: Progressing Goal: Mental status will improve Outcome: Progressing Goal: Verbalization of understanding the information provided will improve Outcome: Progressing  D: Denies SI, HI and AVH. More steady on his feet. MHT within reach A: Continue 1:1 for safety.  R: Safety maintained.

## 2019-03-04 NOTE — Progress Notes (Signed)
1:1 Patient Hourly Rounding  1000: Patient is asleep in his bed, with his assigned safety sitter present at bedside.  1400: Patient is laying in bed, with his eyes closed not fully asleep, with this Probation officer present at bedside.  1800: Patient continues to sleep, in bed, with his assigned safety sitter present at bedside.

## 2019-03-04 NOTE — Plan of Care (Signed)
D- Patient alert and oriented. Patient presented in a drowsy, but pleasant mood on assessment stating that he slept alright last night and had no major complaints or concerns to voice to this Probation officer. Patient reported on his self-inventory a depression/anxiety level of "8/10", however, he didn't go into detail, nor confirm these findings to this Probation officer. Patient denies SI, HI, AVH, and pain at this time. Patient's goal for today is to "sleep".  A- Scheduled medications administered to patient, per MD orders. Support and encouragement provided.  Routine safety checks conducted every 15 minutes.  Patient informed to notify staff with problems or concerns.  R- No adverse drug reactions noted. Patient contracts for safety at this time. Patient compliant with medications and treatment plan. Patient receptive, calm, and cooperative. Patient interacts well with others on the unit.  Patient remains safe at this time.  Problem: Education: Goal: Knowledge of [facility] Education information/materials will improve Outcome: Progressing Goal: Emotional status will improve Outcome: Progressing Goal: Mental status will improve Outcome: Progressing Goal: Verbalization of understanding the information provided will improve Outcome: Progressing   Problem: Activity: Goal: Interest or engagement in activities will improve Outcome: Progressing Goal: Sleeping patterns will improve Outcome: Progressing   Problem: Coping: Goal: Ability to verbalize frustrations and anger appropriately will improve Outcome: Progressing Goal: Ability to demonstrate self-control will improve Outcome: Progressing   Problem: Health Behavior/Discharge Planning: Goal: Identification of resources available to assist in meeting health care needs will improve Outcome: Progressing Goal: Compliance with treatment plan for underlying cause of condition will improve Outcome: Progressing   Problem: Physical Regulation: Goal:  Ability to maintain clinical measurements within normal limits will improve Outcome: Progressing   Problem: Safety: Goal: Periods of time without injury will increase Outcome: Progressing

## 2019-03-05 MED ORDER — CYANOCOBALAMIN 1000 MCG PO TABS: 1000.0000 ug | ORAL_TABLET | Freq: Every day | ORAL | 1 refills | Status: DC

## 2019-03-05 MED ORDER — OLANZAPINE 10 MG PO TBDP: 10.0000 mg | ORAL_TABLET | Freq: Every day | ORAL | 1 refills | Status: DC

## 2019-03-05 MED ORDER — PANTOPRAZOLE SODIUM 40 MG PO TBEC: 40.0000 mg | DELAYED_RELEASE_TABLET | Freq: Every day | ORAL | 1 refills | Status: DC

## 2019-03-05 MED ORDER — TRAZODONE HCL 100 MG PO TABS: 100.0000 mg | ORAL_TABLET | Freq: Every day | ORAL | 1 refills | Status: DC

## 2019-03-05 MED ORDER — FLUOXETINE HCL 40 MG PO CAPS: 40.0000 mg | ORAL_CAPSULE | Freq: Every day | ORAL | 1 refills | Status: DC

## 2019-03-05 NOTE — Progress Notes (Signed)
Patient ID: Troy Fox, male   DOB: 28-Aug-1971, 48 y.o.   MRN: [id number]  Discharge Note:  Patient denies SI/HI/AVH at this time. Discharge instructions, AVS, prescriptions, and transition record gone over with patient. Patient agrees to comply with medication management, follow-up visit, and outpatient therapy. Patient belongings returned to patient. Patient questions and concerns addressed and answered. Patient ambulatory off unit. Patient discharged to home with parents.

## 2019-03-05 NOTE — Progress Notes (Signed)
1:1 observation note 1900-2300 D: Patient has been ambulating around the unit without difficulty, with MHT within reach. Steady gait. Went to the day room for snack. Smiling. Says he is looking forward to discharge. Does not remember what the first few days of his admission were like. Contracting for safety. Denies, SI, HI and AVH.  A: Continue 1:1 for safety. R: Safety maintained. 0000-0400 D: Resting in bed with eyes closed. MHT within reach. A: Continue 1:1 for safety. R: Safety maintained. 0400-0700 D: Patient slept through the night. Voices no complaints. Contracting for safety. Continues to deny SI, HI and AVH.  A: Continue 1:1 for safety. R: Safety maintained.

## 2019-03-05 NOTE — Tx Team (Signed)
Interdisciplinary Treatment and Diagnostic Plan Update  03/05/2019 Time of Session: 830am Troy Fox MRN: [id number]  Principal Diagnosis: Schizoaffective disorder, depressive type [facility])  Secondary Diagnoses: Principal Problem:   Schizoaffective disorder, depressive type ([place]) Active Problems:   Catatonia   Mild malnutrition ([place])   Current Medications:  Current Facility-Administered Medications  Medication Dose Route Frequency Provider Last Rate Last Dose  . acetaminophen (TYLENOL) tablet 650 mg  650 mg Oral Q6H PRN Clapacs, John T, MD      . alum & mag hydroxide-simeth (MAALOX/MYLANTA) 200-200-20 MG/5ML suspension 30 mL  30 mL Oral Q4H PRN Clapacs, John T, MD      . FLUoxetine (PROZAC) capsule 40 mg  40 mg Oral Daily Clapacs, John T, MD   40 mg at 03/04/19 1150  . magnesium hydroxide (MILK OF MAGNESIA) suspension 30 mL  30 mL Oral Daily PRN Clapacs, John T, MD      . OLANZapine zydis (ZYPREXA) disintegrating tablet 10 mg  10 mg Oral QHS Clapacs, Madie Reno, MD   10 mg at 03/04/19 2113  . pantoprazole (PROTONIX) EC tablet 40 mg  40 mg Oral Daily Clapacs, John T, MD   40 mg at 03/04/19 1150  . polyethylene glycol (MIRALAX / GLYCOLAX) packet 17 g  17 g Oral Daily Clapacs, John T, MD   17 g at 03/04/19 1149  . traZODone (DESYREL) tablet 100 mg  100 mg Oral QHS Clapacs, Madie Reno, MD   100 mg at 03/04/19 2113  . vitamin B-12 (CYANOCOBALAMIN) tablet 1,000 mcg  1,000 mcg Oral Daily Clapacs, Madie Reno, MD   1,000 mcg at 03/04/19 1150   PTA Medications: Medications Prior to Admission  Medication Sig Dispense Refill Last Dose  . atenolol (TENORMIN) 25 MG tablet Take 1 tablet (25 mg total) by mouth daily. 90 tablet 0 02/18/2019 at Unknown time  . busPIRone (BUSPAR) 5 MG tablet Take 1 tablet (5 mg total) by mouth 2 (two) times daily. (Patient taking differently: Take 5 mg by mouth 2 (two) times daily as needed. ) 60 tablet 0 02/18/2019 at Unknown time  . Cyanocobalamin (VITAMIN B 12) 500 MCG TABS Take 500  mcg by mouth daily. 90 tablet 0 02/18/2019 at Unknown time  . LORazepam (ATIVAN) 0.5 MG tablet Take 1 tablet (0.5 mg total) by mouth every 8 (eight) hours as needed for anxiety. 90 tablet 2 02/18/2019 at Unknown time  . omeprazole (PRILOSEC) 40 MG capsule Take 1 capsule (40 mg total) by mouth daily. 90 capsule 0 02/18/2019 at Unknown time  . paliperidone (INVEGA) 6 MG 24 hr tablet Take 1 tablet (6 mg total) by mouth daily. 90 tablet 0 02/18/2019 at Unknown time  . polyethylene glycol (MIRALAX) 17 g packet Take 17 g by mouth daily as needed for moderate constipation. 72 packet 0 02/18/2019 at Unknown time  . sertraline (ZOLOFT) 50 MG tablet Take 3 tablets (150 mg total) by mouth daily. 270 tablet 0 02/18/2019 at Unknown time  . traZODone (DESYREL) 50 MG tablet Take 1 tablet (50 mg total) by mouth at bedtime. 90 tablet 0 02/18/2019 at Unknown time  . [EXPIRED] Valbenazine Tosylate (INGREZZA) 40 & 80 MG CPPK Take 40 mg by mouth daily for 7 days, THEN 80 mg daily for 30 days. 30 each 0 02/18/2019 at Unknown time  . benztropine (COGENTIN) 1 MG tablet Take 1 tablet (1 mg total) by mouth 2 (two) times daily for 7 days. 14 tablet 0     Patient Stressors: Financial difficulties  Health problems  Patient Strengths: Motivation for treatment/growth Supportive family/friends  Treatment Modalities: Medication Management, Group therapy, Case management,  1 to 1 session with clinician, Psychoeducation, Recreational therapy.   Physician Treatment Plan for Primary Diagnosis: Schizoaffective disorder, depressive type (HCC) Long Term Goal(s): Improvement in symptoms so as ready for discharge Improvement in symptoms so as ready for discharge   Short Term Goals: Ability to verbalize feelings will improve Compliance with prescribed medications will improve Ability to maintain clinical measurements within normal limits will improve  Medication Management: Evaluate patient's response, side effects, and tolerance of medication  regimen.  Therapeutic Interventions: 1 to 1 sessions, Unit Group sessions and Medication administration.  Evaluation of Outcomes: Progressing  Physician Treatment Plan for Secondary Diagnosis: Principal Problem:   Schizoaffective disorder, depressive type (HCC) Active Problems:   Catatonia   Mild malnutrition (HCC)  Long Term Goal(s): Improvement in symptoms so as ready for discharge Improvement in symptoms so as ready for discharge   Short Term Goals: Ability to verbalize feelings will improve Compliance with prescribed medications will improve Ability to maintain clinical measurements within normal limits will improve     Medication Management: Evaluate patient's response, side effects, and tolerance of medication regimen.  Therapeutic Interventions: 1 to 1 sessions, Unit Group sessions and Medication administration.  Evaluation of Outcomes: Progressing   RN Treatment Plan for Primary Diagnosis: Schizoaffective disorder, depressive type (HCC) Long Term Goal(s): Knowledge of disease and therapeutic regimen to maintain health will improve  Short Term Goals: Ability to identify and develop effective coping behaviors will improve and Compliance with prescribed medications will improve  Medication Management: RN will administer medications as ordered by provider, will assess and evaluate patient's response and provide education to patient for prescribed medication. RN will report any adverse and/or side effects to prescribing provider.  Therapeutic Interventions: 1 on 1 counseling sessions, Psychoeducation, Medication administration, Evaluate responses to treatment, Monitor vital signs and CBGs as ordered, Perform/monitor CIWA, COWS, AIMS and Fall Risk screenings as ordered, Perform wound care treatments as ordered.  Evaluation of Outcomes: Progressing   LCSW Treatment Plan for Primary Diagnosis: Schizoaffective disorder, depressive type (HCC) Long Term Goal(s): Safe transition to  appropriate next level of care at discharge, Engage patient in therapeutic group addressing interpersonal concerns.  Short Term Goals: Engage patient in aftercare planning with referrals and resources and Increase skills for wellness and recovery  Therapeutic Interventions: Assess for all discharge needs, 1 to 1 time with Social worker, Explore available resources and support systems, Assess for adequacy in community support network, Educate family and significant other(s) on suicide prevention, Complete Psychosocial Assessment, Interpersonal group therapy.  Evaluation of Outcomes: Progressing   Progress in Treatment: Attending groups: No. Participating in groups: No. Taking medication as prescribed: Yes. Toleration medication: Yes. Family/Significant other contact made: Yes, individual(s) contacted:  pts father Patient understands diagnosis: No. Discussing patient identified problems/goals with staff: No. Medical problems stabilized or resolved: No. Denies suicidal/homicidal ideation: Yes Issues/concerns per patient self-inventory: No. Other: N/A  New problem(s) identified: No, Describe:  None reported  New Short Term/Long Term Goal(s):Pt requires assistance with walking and experiencing minimal verbal communication.   Patient Goals:  Pt unable to provide a goal at this time.  Discharge Plan or Barriers: Pt will resume outpatient ECT. 02/07/19-No change in progress. Will receive ECT today. 02/14/19-Pt will receive ECT today. Pt able to give one word responses to some questions, unable to sign  documentation to schedule follow up care at this time. Dr. Toni Amendlapacs  reports he will contact the pt's parents to see if he can return home in his current state and resume outpatient ECT. D/C plan TBD. 02/19/19- Pt is receiving ECT, still requires assistance with walking and minimal verbal communication. D/C plan TBD.  UPDATE 02/23/2019:  Review of the patients chart indicates that ECT has been stopped at  this time due to concerns that the patient was regressing.  Phone conference with father, primary CSW and psychiatrist was completed and father is requesting respite referral and/or possible referral to a day program.  CSW will follow up. UPDATE 02/28/19: [facility] [place]. Pt will have to complete an assessment at discharge for PSR services and express interest in their day program. Pt is still not receiving ECT at this time and has improved with walking without assistance for short distances. UPDATE 03/05/19: Pt will be discharged today 03/05/19 and parents will [facility] [place] [facility]   Reason for Continuation of Hospitalization: Medication stabilization, ECT  Estimated Length of Stay: Today 03/05/2019  Recreational Therapy: Patient Stressors: N/A  Patient Goal: Patient will engage in groups without prompting or encouragement from LRT x3 group sessions within 5 recreation therapy group sessions  Attendees: Patient: 03/05/2019 9:43 AM  Physician: Mordecai RasmussenJohn Clapacs 03/05/2019 9:43 AM  Nursing:  03/05/2019 9:43 AM  RN Care Manager: 03/05/2019 9:43 AM  Social Worker:   Iris Pertlivia Jamarria Real, LCSW 03/05/2019 9:43 AM  Recreational Therapist:  03/05/2019 9:43 AM  Other:  03/05/2019 9:43 AM  Other:  03/05/2019 9:43 AM  Other: 03/05/2019 9:43 AM    Scribe for Treatment Team: Charlann Langelivia K Eriel Doyon, LCSW 03/05/2019 9:43 AM

## 2019-03-05 NOTE — [facility] Counselor (Signed)
CSW met with pt to discuss discharge plan. CSW informed pt he is scheduled to be discharged today, pt did not respond. CSW made another attempt to inform him of the discharge and he did not respond. Pt still displaying minimal communication, unable to sign consent forms at this time. Pt is scheduled to be discharged into his parent's care and follow up at [facility] in [place]. Pt's parents request referral to [facility] program. Pt's father scheduled to pick pt up at 1pm.

## 2019-03-05 NOTE — Progress Notes (Signed)
  [facility] Adult Case Management Discharge Plan :  Will you be returning to the same living situation after discharge:  Yes,  pt lives with parents At discharge, do you have transportation home?: Yes,  pt's father will pick up at 1pm Do you have the ability to pay for your medications: Yes,  insurance  Release of information consent forms completed and in the chart;  Patient's signature needed at discharge.  Patient to Follow up at: Follow-up Information    [facility] Follow up.   Why: Please follow up with Delrae Alfred to enroll in their  [facility] Program. They will do an assessment by phone, schedule an appointment with a therapist.  Program hours are Monday- Friday 8:00- 4:00.  Contact [facility] at [phone] for transportation.    Contact information: [address] [place] [address] [phone]           Next level of care provider has access to [place] and Suicide Prevention discussed: No. Pt unable to sign consent giving permission to have family contact. Pt displays minimal communication difficult to engage in conversation with him.  Have you used any form of tobacco in the last 30 days? (Cigarettes, Smokeless Tobacco, Cigars, and/or Pipes): Patient Refused Screening  Has patient been referred to the Quitline?: N/A patient is not a smoker  Patient has been referred for addiction treatment: N/A  Yvette Rack, LCSW 03/05/2019, 9:33 AM

## 2019-03-05 NOTE — Progress Notes (Signed)
Recreation Therapy Notes   Date: 03/05/2019  Time: 9:30 am   Location: Craft room   Behavioral response: N/A   Intervention Topic: Problem Solving  Discussion/Intervention: Patient did not attend group.   Clinical Observations/Feedback:  Patient did not attend group.   Jermani Pund LRT/CTRS         Kaydan Wilhoite 03/05/2019 11:07 AM

## 2019-03-05 NOTE — Discharge Summary (Signed)
Physician Discharge Summary Note  Patient:  Troy Fox is an 48 y.o., male MRN:  [id number] DOB:  Aug 22, 1971 Patient phone:  [phone] (home)  Patient address:   [place] [address],  Total Time spent with patient: 45 minutes  Date of Admission:  01/29/2019 Date of Discharge: March 05, 2019  Reason for Admission: Admitted after being seen in my office for an ECT consult.  Patient was catatonic unable to provide any care for himself.  Overwhelming his family.  History of schizoaffective disorder with decline recently into a catatonic condition  Principal Problem: Schizoaffective disorder, depressive type [facility]) Discharge Diagnoses: Principal Problem:   Schizoaffective disorder, depressive type ([place]) Active Problems:   Catatonia   Mild malnutrition ([place])   Past Psychiatric History: Long history of schizoaffective disorder lifelong more recently in the last year or so decline into current condition  Past Medical History:  Past Medical History:  Diagnosis Date  . Bipolar 1 disorder ([place])   . 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 GERD (gastroesophageal reflux disease)   . Nonverbal   . Schizoaffective disorder ([place])   . Tardive dyskinesia    History reviewed. No pertinent surgical history. Family History:  Family History  Adopted: Yes  Family history unknown: Yes   Family Psychiatric  History: None known Social History:  Social History   Substance and Sexual Activity  Alcohol Use Never  . Frequency: Never     Social History   Substance and Sexual Activity  Drug Use Never    Social History   Socioeconomic History  . Marital status: Single    Spouse name: Not on file  . Number of children: Not on file  . Years of education: Not on file  . Highest education level: Not on file  Occupational History  . Not on file  Social Needs  . Financial resource strain: Not on file  . Food insecurity    Worry: Not on file    Inability: Not on file  .  Transportation needs    Medical: Not on file    Non-medical: Not on file  Tobacco Use  . Smoking status: Never Smoker  . Smokeless tobacco: Never Used  Substance and Sexual Activity  . Alcohol use: Never    Frequency: Never  . Drug use: Never  . Sexual activity: Not Currently  Lifestyle  . Physical activity    Days per week: Not on file    Minutes per session: Not on file  . Stress: Not on file  Relationships  . Social Herbalist on phone: Not on file    Gets together: Not on file    Attends religious service: Not on file    Active member of club or organization: Not on file    Attends meetings of clubs or organizations: Not on file    Relationship status: Not on file  Other Topics Concern  . Not on file  Social History Narrative  . Not on file    Hospital Course: Patient admitted to the psychiatric ward.  Family was agreeable to beginning ECT treatment.  ECT was initiated bilateral treatment.  Patient tolerated treatment well and within 1-2 treatment showed significant improvement in catatonia.  Was able to respond to caregivers start eating start getting up and ambulating.  After a few more he started to become more confused and withdrawn and appeared to be getting negative effects of the ECT.  ECT was discontinued and he was continued on  antipsychotic medication.  Initially this was Saphris because of the ease of treatment but was switched to olanzapine.  Continued on antidepressant treatment.  Patient needed a one-to-one throughout his hospital stay but gradually started to interact a little bit more with others get up and ambulate to the bathroom and sometimes around the ward.  Remained very cognitively impaired.  At no point did he engage in any dangerous violent or suicidal behavior.  Social work made a great effort to find appropriate follow-up in the [place] area.  Patient discharged with follow-up there through a local day program and continued medication  management  Physical Findings: AIMS: Facial and Oral Movements Muscles of Facial Expression: None, normal Lips and Perioral Area: None, normal Jaw: None, normal Tongue: None, normal,Extremity Movements Upper (arms, wrists, hands, fingers): None, normal Lower (legs, knees, ankles, toes): None, normal, Trunk Movements Neck, shoulders, hips: None, normal, Overall Severity Severity of abnormal movements (highest score from questions above): None, normal Incapacitation due to abnormal movements: None, normal Patient's awareness of abnormal movements (rate only patient's report): No Awareness, Dental Status Current problems with teeth and/or dentures?: No Does patient usually wear dentures?: No  CIWA:    COWS:     Musculoskeletal: Strength & Muscle Tone: within normal limits Gait & Station: normal Patient leans: N/A  Psychiatric Specialty Exam: Physical Exam  Nursing note and vitals reviewed. Constitutional: He appears well-developed and well-nourished.  HENT:  Head: Normocephalic and atraumatic.  Eyes: Pupils are equal, round, and reactive to light. Conjunctivae are normal.  Neck: Normal range of motion.  Cardiovascular: Regular rhythm and normal heart sounds.  Respiratory: Effort normal.  GI: Soft.  Musculoskeletal: Normal range of motion.  Neurological: He is alert.  Skin: Skin is warm and dry.  Psychiatric: His affect is blunt. His speech is delayed. He is slowed. Cognition and memory are impaired. He expresses inappropriate judgment. He expresses no suicidal ideation.    Review of Systems  Constitutional: Negative.   HENT: Negative.   Eyes: Negative.   Respiratory: Negative.   Cardiovascular: Negative.   Gastrointestinal: Negative.   Musculoskeletal: Negative.   Skin: Negative.   Neurological: Negative.   Psychiatric/Behavioral: Negative.     Blood pressure 98/62, pulse 60, temperature 97.6 F (36.4 C), temperature source Oral, resp. rate 18, height 5\' 5"  (1.651  m), weight 62.4 kg, SpO2 97 %.Body mass index is 22.88 kg/m.  General Appearance: Casual  Eye Contact:  Minimal  Speech:  Slow  Volume:  Decreased  Mood:  Dysphoric  Affect:  Congruent  Thought Process:  Disorganized  Orientation:  Full (Time, Place, and Person)  Thought Content:  Rumination  Suicidal Thoughts:  No  Homicidal Thoughts:  No  Memory:  Immediate;   Fair Recent;   Fair Remote;   Fair  Judgement:  Impaired  Insight:  Shallow  Psychomotor Activity:  Decreased  Concentration:  Concentration: Fair  Recall:  FiservFair  Fund of Knowledge:  Fair  Language:  Poor  Akathisia:  Negative  Handed:  Right  AIMS (if indicated):     Assets:  Social Support  ADL's:  Impaired  Cognition:  Impaired,  Moderate  Sleep:  Number of Hours: 9.25     Have you used any form of tobacco in the last 30 days? (Cigarettes, Smokeless Tobacco, Cigars, and/or Pipes): Patient Refused Screening  Has this patient used any form of tobacco in the last 30 days? (Cigarettes, Smokeless Tobacco, Cigars, and/or Pipes) Yes, Yes, A prescription for an FDA-approved tobacco  cessation medication was offered at discharge and the patient refused  Blood Alcohol level:  Lab Results  Component Value Date   ETH <10 01/24/2019    Metabolic Disorder Labs:  Lab Results  Component Value Date   HGBA1C 4.9 01/30/2019   MPG 93.93 01/30/2019   No results found for: PROLACTIN No results found for: CHOL, TRIG, HDL, CHOLHDL, VLDL, LDLCALC  See Psychiatric Specialty Exam and Suicide Risk Assessment completed by Attending Physician prior to discharge.  Discharge destination:  Home  Is patient on multiple antipsychotic therapies at discharge:  No   Has Patient had three or more failed trials of antipsychotic monotherapy by history:  No  Recommended Plan for Multiple Antipsychotic Therapies: NA  Discharge Instructions    Diet - low sodium heart healthy   Complete by: As directed    Increase activity slowly    Complete by: As directed      Allergies as of 03/05/2019   No Known Allergies     Medication List    STOP taking these medications   atenolol 25 MG tablet Commonly known as: TENORMIN   benztropine 1 MG tablet Commonly known as: COGENTIN   busPIRone 5 MG tablet Commonly known as: BUSPAR   LORazepam 0.5 MG tablet Commonly known as: ATIVAN   omeprazole 40 MG capsule Commonly known as: PRILOSEC   paliperidone 6 MG 24 hr tablet Commonly known as: INVEGA   polyethylene glycol 17 g packet Commonly known as: MiraLax   sertraline 50 MG tablet Commonly known as: ZOLOFT   Valbenazine Tosylate 40 & 80 MG Cppk Commonly known as: Ingrezza     TAKE these medications     Indication  cyanocobalamin 1000 MCG tablet Take 1 tablet (1,000 mcg total) by mouth daily. What changed:   medication strength  how much to take  Indication: Inadequate Vitamin B12   FLUoxetine 40 MG capsule Commonly known as: PROZAC Take 1 capsule (40 mg total) by mouth daily.  Indication: Depression   OLANZapine zydis 10 MG disintegrating tablet Commonly known as: ZYPREXA Take 1 tablet (10 mg total) by mouth at bedtime.  Indication: Schizophrenia   pantoprazole 40 MG tablet Commonly known as: PROTONIX Take 1 tablet (40 mg total) by mouth daily.  Indication: Gastroesophageal Reflux Disease   traZODone 100 MG tablet Commonly known as: DESYREL Take 1 tablet (100 mg total) by mouth at bedtime. What changed:   medication strength  how much to take  Indication: Trouble Sleeping      Follow-up Information    [facility] Follow up.   Why: Please [facility]. They will do an assessment by phone, schedule an appointment with a therapist.  Program hours are Monday- Friday 8:00- 4:00.  Contact [facility] at [phone] for transportation.    Contact information: [address] [place] [id number] [phone]            Follow-up recommendations:  Activity:  Activity as tolerated.  Multiple times we have been working with him to getting more physically active Diet:  Regular diet Other:  Follow-up outpatient with day program in [place].  Comments: Prescriptions of current medicines provided as well as 7-day supply at discharge  Signed: Mordecai RasmussenJohn Clapacs, MD 03/05/2019, 6:47 PM

## 2019-03-05 NOTE — Progress Notes (Signed)
Recreation Therapy Notes  INPATIENT RECREATION TR PLAN  Patient Details Name: Troy Fox MRN: [id number] DOB: 06/10/1971 Today's Date: 03/05/2019  Rec Therapy Plan Is patient appropriate for Therapeutic Recreation?: Yes Treatment times per week: at least 3 Estimated Length of Stay: 5-7 days TR Treatment/Interventions: Group participation (Comment)  Discharge Criteria Pt will be discharged from therapy if:: Discharged Treatment plan/goals/alternatives discussed and agreed upon by:: Patient/family  Discharge Summary Short term goals set: Patient will engage in groups without prompting or encouragement from LRT x3 group sessions within 5 recreation therapy group sessions Short term goals met: Not met Reason goals not met: Patient spent most of his time in his room Therapeutic equipment acquired: N/A Reason patient discharged from therapy: Discharge from hospital Pt/family agrees with progress & goals achieved: Yes Date patient discharged from therapy: 03/05/19   [name] 03/05/2019, 11:23 AM

## 2019-03-05 NOTE — Progress Notes (Signed)
Patient wants to sleep in like he did yesterday and will take his morning medication after he gets up to eat.

## 2019-03-05 NOTE — Progress Notes (Signed)
1:1 Patient Hourly Rounding  1000: Patient is asleep, in his room, with his assigned safety sitter present at bedside.

## 2019-03-19 DIAGNOSIS — I10 Essential (primary) hypertension: Secondary | ICD-10-CM | POA: Diagnosis not present

## 2019-03-26 ENCOUNTER — Other Ambulatory Visit (HOSPITAL_COMMUNITY): Payer: Self-pay

## 2019-03-26 MED ORDER — INGREZZA 80 MG PO CAPS: 30.0000 mg | ORAL_CAPSULE | Freq: Every day | ORAL | 0 refills | Status: DC

## 2019-04-04 DIAGNOSIS — M79674 Pain in right toe(s): Secondary | ICD-10-CM | POA: Diagnosis not present

## 2019-04-04 DIAGNOSIS — M79672 Pain in left foot: Secondary | ICD-10-CM | POA: Diagnosis not present

## 2019-04-04 DIAGNOSIS — B351 Tinea unguium: Secondary | ICD-10-CM | POA: Diagnosis not present

## 2019-04-04 DIAGNOSIS — M79675 Pain in left toe(s): Secondary | ICD-10-CM | POA: Diagnosis not present

## 2019-04-04 DIAGNOSIS — M79671 Pain in right foot: Secondary | ICD-10-CM | POA: Diagnosis not present

## 2019-04-08 ENCOUNTER — Other Ambulatory Visit: Payer: Self-pay | Admitting: Psychiatry

## 2019-04-10 DIAGNOSIS — Z712 Person consulting for explanation of examination or test findings: Secondary | ICD-10-CM | POA: Diagnosis not present

## 2019-04-10 DIAGNOSIS — Z0000 Encounter for general adult medical examination without abnormal findings: Secondary | ICD-10-CM | POA: Diagnosis not present

## 2019-04-10 DIAGNOSIS — I10 Essential (primary) hypertension: Secondary | ICD-10-CM | POA: Diagnosis not present

## 2019-04-10 DIAGNOSIS — M7918 Myalgia, other site: Secondary | ICD-10-CM | POA: Diagnosis not present

## 2019-04-27 ENCOUNTER — Emergency Department (HOSPITAL_COMMUNITY): Payer: Medicare Other

## 2019-04-27 ENCOUNTER — Emergency Department (HOSPITAL_COMMUNITY)
Admission: EM | Admit: 2019-04-27 | Discharge: 2019-04-27 | Disposition: A | Discharge status: Home / Self Care | Payer: Medicare Other | Attending: Emergency Medicine | Admitting: Emergency Medicine

## 2019-04-27 ENCOUNTER — Encounter (HOSPITAL_COMMUNITY): Payer: Self-pay | Admitting: Radiology

## 2019-04-27 ENCOUNTER — Other Ambulatory Visit: Payer: Self-pay

## 2019-04-27 DIAGNOSIS — K769 Liver disease, unspecified: Secondary | ICD-10-CM | POA: Insufficient documentation

## 2019-04-27 DIAGNOSIS — Z79899 Other long term (current) drug therapy: Secondary | ICD-10-CM | POA: Insufficient documentation

## 2019-04-27 DIAGNOSIS — K449 Diaphragmatic hernia without obstruction or gangrene: Secondary | ICD-10-CM | POA: Insufficient documentation

## 2019-04-27 DIAGNOSIS — R1084 Generalized abdominal pain: Secondary | ICD-10-CM | POA: Insufficient documentation

## 2019-04-27 DIAGNOSIS — K7689 Other specified diseases of liver: Secondary | ICD-10-CM | POA: Diagnosis not present

## 2019-04-27 LAB — CBC WITH DIFFERENTIAL/PLATELET
Abs Immature Granulocytes: 0.01 10*3/uL (ref 0.00–0.07)
Basophils Absolute: 0 10*3/uL (ref 0.0–0.1)
Basophils Relative: 1 %
Eosinophils Absolute: 0.1 10*3/uL (ref 0.0–0.5)
Eosinophils Relative: 3 %
HCT: 43.2 % (ref 39.0–52.0)
Hemoglobin: 15 g/dL (ref 13.0–17.0)
Immature Granulocytes: 0 %
Lymphocytes Relative: 31 %
Lymphs Abs: 1.3 10*3/uL (ref 0.7–4.0)
MCH: 29.9 pg (ref 26.0–34.0)
MCHC: 34.7 g/dL (ref 30.0–36.0)
MCV: 86.2 fL (ref 80.0–100.0)
Monocytes Absolute: 0.5 10*3/uL (ref 0.1–1.0)
Monocytes Relative: 13 %
Neutro Abs: 2.3 10*3/uL (ref 1.7–7.7)
Neutrophils Relative %: 52 %
Platelets: 128 10*3/uL — ABNORMAL LOW (ref 150–400)
RBC: 5.01 MIL/uL (ref 4.22–5.81)
RDW: 12.5 % (ref 11.5–15.5)
WBC: 4.3 10*3/uL (ref 4.0–10.5)
nRBC: 0 % (ref 0.0–0.2)

## 2019-04-27 LAB — URINALYSIS, ROUTINE W REFLEX MICROSCOPIC
Bilirubin Urine: NEGATIVE
Glucose, UA: NEGATIVE mg/dL
Hgb urine dipstick: NEGATIVE
Ketones, ur: NEGATIVE mg/dL
Leukocytes,Ua: NEGATIVE
Nitrite: NEGATIVE
Protein, ur: NEGATIVE mg/dL
Specific Gravity, Urine: 1.018 (ref 1.005–1.030)
pH: 7 (ref 5.0–8.0)

## 2019-04-27 LAB — COMPREHENSIVE METABOLIC PANEL
ALT: 12 U/L (ref 0–44)
AST: 16 U/L (ref 15–41)
Albumin: 3.7 g/dL (ref 3.5–5.0)
Alkaline Phosphatase: 45 U/L (ref 38–126)
Anion gap: 9 (ref 5–15)
BUN: 13 mg/dL (ref 6–20)
CO2: 24 mmol/L (ref 22–32)
Calcium: 9.3 mg/dL (ref 8.9–10.3)
Chloride: 106 mmol/L (ref 98–111)
Creatinine, Ser: 0.82 mg/dL (ref 0.61–1.24)
GFR calc Af Amer: >60 mL/min (ref >60)
GFR calc non Af Amer: >60 mL/min (ref >60)
Glucose, Bld: 101 mg/dL — ABNORMAL HIGH (ref 70–99)
Potassium: 3.8 mmol/L (ref 3.5–5.1)
Sodium: 139 mmol/L (ref 135–145)
Total Bilirubin: 0.8 mg/dL (ref 0.3–1.2)
Total Protein: 7.2 g/dL (ref 6.5–8.1)

## 2019-04-27 LAB — LIPASE, BLOOD: Lipase: 27 U/L (ref 11–51)

## 2019-04-27 MED ORDER — IOHEXOL 300 MG/ML  SOLN
100.0000 mL | Freq: Once | INTRAMUSCULAR | Status: AC | PRN
  Administered 2019-04-27: 100 mL via INTRAVENOUS

## 2019-04-27 MED ORDER — HYDROMORPHONE HCL 1 MG/ML IJ SOLN
0.5000 mg | Freq: Once | INTRAMUSCULAR | Status: AC
  Administered 2019-04-27: 0.5 mg via INTRAVENOUS
  Filled 2019-04-27: qty 1

## 2019-04-27 MED ORDER — ALUM & MAG HYDROXIDE-SIMETH 200-200-20 MG/5ML PO SUSP
30.0000 mL | Freq: Once | ORAL | Status: AC
  Administered 2019-04-27: 30 mL via ORAL
  Filled 2019-04-27: qty 30

## 2019-04-27 NOTE — ED Triage Notes (Signed)
C/o pain to left lower abdomen since yesterday.  Tender to LLQ rates 8/10.  Last BM yesterday, normal.

## 2019-04-27 NOTE — Discharge Instructions (Addendum)
Mylanta for symptoms.  Schedule to see GI doctor for evaluation of hiatal hernia and liver lesions

## 2019-04-27 NOTE — ED Provider Notes (Signed)
[facility] EMERGENCY DEPARTMENT Provider Note   CSN: [id number] Arrival date & time: 04/27/19  1322     History   Chief Complaint Chief Complaint  Patient presents with  . Abdominal Pain    HPI Troy Fox is a 48 y.o. male.     The history is provided by the patient and a relative. No language interpreter was used.  Abdominal Pain Pain location:  Generalized Pain quality: aching   Pain radiates to:  Does not radiate Pain severity:  Moderate Onset quality:  Gradual Duration:  2 days Timing:  Constant Progression:  Worsening Chronicity:  New Relieved by:  Nothing Worsened by:  Nothing Ineffective treatments:  None tried Associated symptoms: no fever, no nausea and no vomiting   Risk factors: no alcohol abuse and has not had multiple surgeries   History mostly by father. Pt has schizophrenia Pt has been complaining of abdominal pain for 2 days.  Pt has had diverticulosis noted on colonoscopy in the past.   Past Medical History:  Diagnosis Date  . Bipolar 1 disorder ([place])   . 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 GERD (gastroesophageal reflux disease)   . Nonverbal   . Schizoaffective disorder ([place])   . Tardive dyskinesia     Patient Active Problem List   Diagnosis Date Noted  . Schizoaffective disorder, depressive type ([place]) 01/29/2019  . Catatonia 01/29/2019  . Mild malnutrition ([place]) 01/29/2019  . Schizoaffective disorder, bipolar type ([place]) 01/03/2019  . Acute neuroleptic-induced akathisia 01/03/2019    No past surgical history on file.      Home Medications    Prior to Admission medications   Medication Sig Start Date End Date Taking? Authorizing Provider  cyanocobalamin 1000 MCG tablet Take 1 tablet (1,000 mcg total) by mouth daily. 03/05/19   Clapacs, Madie Reno, MD  FLUoxetine (PROZAC) 40 MG capsule TAKE 1 CAPSULE BY MOUTH DAILY. 04/09/19   Clapacs, Madie Reno, MD  OLANZapine zydis (ZYPREXA) 10 MG disintegrating tablet Take 1 tablet (10 mg total) by mouth  at bedtime. 03/05/19   Clapacs, Madie Reno, MD  pantoprazole (PROTONIX) 40 MG tablet Take 1 tablet (40 mg total) by mouth daily. 03/05/19   Clapacs, Madie Reno, MD  traZODone (DESYREL) 100 MG tablet Take 1 tablet (100 mg total) by mouth at bedtime. 03/05/19   Clapacs, Madie Reno, MD  Valbenazine Tosylate [facility]) 80 MG CAPS Take 30 mg by mouth daily. 03/26/19   Pucilowski, Marchia Bond, MD    Family History Family History  Adopted: Yes  Family history unknown: Yes    Social History Social History   Tobacco Use  . Smoking status: Never Smoker  . Smokeless tobacco: Never Used  Substance Use Topics  . Alcohol use: Never    Frequency: Never  . Drug use: Never     Allergies   Patient has no known allergies.   Review of Systems Review of Systems  Constitutional: Negative for fever.  Gastrointestinal: Positive for abdominal pain. Negative for nausea and vomiting.  All other systems reviewed and are negative.    Physical Exam Updated Vital Signs BP 107/69   Pulse (!) 101   Temp 98.2 F (36.8 C) (Oral)   Resp 16   Ht 5\' 5"  (1.651 m)   Wt 74.4 kg   SpO2 94%   BMI 27.29 kg/m   Physical Exam Vitals signs and nursing note reviewed.  Constitutional:      Appearance: He is well-developed.  HENT:     Head:  Normocephalic and atraumatic.  Eyes:     Conjunctiva/sclera: Conjunctivae normal.  Neck:     Musculoskeletal: Neck supple.  Cardiovascular:     Rate and Rhythm: Normal rate and regular rhythm.     Heart sounds: No murmur.  Pulmonary:     Effort: Pulmonary effort is normal. No respiratory distress.     Breath sounds: Normal breath sounds.  Abdominal:     General: Abdomen is flat. Bowel sounds are normal.     Palpations: Abdomen is soft.     Tenderness: There is generalized abdominal tenderness and tenderness in the right lower quadrant and left lower quadrant. There is no rebound.     Hernia: No hernia is present.  Skin:    General: Skin is warm and dry.  Neurological:      General: No focal deficit present.     Mental Status: He is alert.  Psychiatric:        Mood and Affect: Mood normal.      ED Treatments / Results  Labs (all labs ordered are listed, but only abnormal results are displayed) Labs Reviewed  CBC WITH DIFFERENTIAL/PLATELET - Abnormal; Notable for the following components:      Result Value   Platelets 128 (*)    All other components within normal limits  COMPREHENSIVE METABOLIC PANEL - Abnormal; Notable for the following components:   Glucose, Bld 101 (*)    All other components within normal limits  URINALYSIS, ROUTINE W REFLEX MICROSCOPIC  LIPASE, BLOOD    EKG None  Radiology Ct Abdomen Pelvis W Contrast  Result Date: 04/27/2019 CLINICAL DATA:  Left lower quadrant abdominal pain since yesterday. EXAM: CT ABDOMEN AND PELVIS WITH CONTRAST TECHNIQUE: Multidetector CT imaging of the abdomen and pelvis was performed using the standard protocol following bolus administration of intravenous contrast. CONTRAST:  100mL OMNIPAQUE IOHEXOL 300 MG/ML  SOLN COMPARISON:  None. FINDINGS: Lower chest: The lung bases are clear of an acute process. No pleural effusions or pulmonary lesions. The heart is normal in size. No pericardial effusion. There is a moderate to large hiatal hernia noted. There is a paraesophageal component also. Hepatobiliary: Area of peripheral enhancement at the left hepatic dome most consistent with a vascular shunt. Small low-attenuation lesion in the left lobe is likely a benign cyst. No worrisome hepatic lesions or intrahepatic biliary dilatation. The portal and hepatic veins are patent. The gallbladder is normal. No common bile duct dilatation. Pancreas: No mass, inflammation or ductal dilatation. Spleen: Mild splenomegaly. Spleen measures 14 x 10 x 10 cm. No focal lesions. Adrenals/Urinary Tract: The adrenal glands and kidneys are unremarkable. No renal, ureteral or bladder calculi or mass. Stomach/Bowel: The stomach, duodenum,  small bowel and colon are grossly normal without oral contrast. No inflammatory changes, mass lesions or obstructive findings. The terminal ileum and appendix are normal. Scattered colonic diverticulosis but no findings for acute diverticulitis. Vascular/Lymphatic: The aorta is normal in caliber. No dissection. The branch vessels are patent. The major venous structures are patent. No mesenteric or retroperitoneal mass or adenopathy. Small scattered lymph nodes are noted. Reproductive: The prostate gland and seminal vesicles are unremarkable. Other: No pelvic mass or adenopathy. No free pelvic fluid collections. No inguinal mass or adenopathy. No abdominal wall hernia or subcutaneous lesions. Musculoskeletal: No significant bony findings. IMPRESSION: 1. No acute abdominal/pelvic findings, mass lesions or adenopathy. 2. No renal, ureteral or bladder calculi or mass. 3. Moderate to large hiatal hernia. 4. Benign-appearing liver lesions. Electronically Signed   By:  Rudie MeyerP.  Gallerani M.D.   On: 04/27/2019 15:41    Procedures Procedures (including critical care time)  Medications Ordered in ED Medications  HYDROmorphone (DILAUDID) injection 0.5 mg (0.5 mg Intravenous Given 04/27/19 1504)  iohexol (OMNIPAQUE) 300 MG/ML solution 100 mL (100 mLs Intravenous Contrast Given 04/27/19 1516)  alum & mag hydroxide-simeth (MAALOX/MYLANTA) 200-200-20 MG/5ML suspension 30 mL (30 mLs Oral Given 04/27/19 1644)     Initial Impression / Assessment and Plan / ED Course  I have reviewed the triage vital signs and the nursing notes.  Pertinent labs & imaging results that were available during my care of the patient were reviewed by me and considered in my medical decision making (see chart for details).        MDM   I counseled pt and father on results.  I advised follow up with Dr. Darrick PennaFields.  Father advised of liver lesions and hiatal hernia.  I advised try mylanta.  Return if symptoms worsen or change.  Final Clinical  Impressions(s) / ED Diagnoses   Final diagnoses:  Generalized abdominal pain  Liver lesion  Hiatal hernia    ED Discharge Orders    None    An After Visit Summary was printed and given to the patient.    Osie CheeksSofia, Leslie K, PA-C 04/27/19 2017    Milagros Lollykstra, Richard S, MD 04/28/19 [phone]

## 2019-04-30 DIAGNOSIS — R1010 Upper abdominal pain, unspecified: Secondary | ICD-10-CM | POA: Diagnosis not present

## 2019-04-30 DIAGNOSIS — K449 Diaphragmatic hernia without obstruction or gangrene: Secondary | ICD-10-CM | POA: Diagnosis not present

## 2019-05-04 ENCOUNTER — Emergency Department (HOSPITAL_COMMUNITY)
Admission: EM | Admit: 2019-05-04 | Discharge: 2019-05-04 | Disposition: A | Discharge status: Home / Self Care | Payer: Medicare Other | Attending: Emergency Medicine | Admitting: Emergency Medicine

## 2019-05-04 ENCOUNTER — Other Ambulatory Visit: Payer: Self-pay

## 2019-05-04 ENCOUNTER — Encounter (HOSPITAL_COMMUNITY): Payer: Self-pay | Admitting: Emergency Medicine

## 2019-05-04 DIAGNOSIS — Z79899 Other long term (current) drug therapy: Secondary | ICD-10-CM | POA: Diagnosis not present

## 2019-05-04 DIAGNOSIS — R109 Unspecified abdominal pain: Secondary | ICD-10-CM | POA: Diagnosis present

## 2019-05-04 DIAGNOSIS — K449 Diaphragmatic hernia without obstruction or gangrene: Secondary | ICD-10-CM | POA: Diagnosis not present

## 2019-05-04 DIAGNOSIS — R195 Other fecal abnormalities: Secondary | ICD-10-CM | POA: Diagnosis not present

## 2019-05-04 LAB — COMPREHENSIVE METABOLIC PANEL
ALT: 14 U/L (ref 0–44)
AST: 16 U/L (ref 15–41)
Albumin: 4 g/dL (ref 3.5–5.0)
Alkaline Phosphatase: 51 U/L (ref 38–126)
Anion gap: 7 (ref 5–15)
BUN: 15 mg/dL (ref 6–20)
CO2: 26 mmol/L (ref 22–32)
Calcium: 9.4 mg/dL (ref 8.9–10.3)
Chloride: 108 mmol/L (ref 98–111)
Creatinine, Ser: 0.86 mg/dL (ref 0.61–1.24)
GFR calc Af Amer: >60 mL/min (ref >60)
GFR calc non Af Amer: >60 mL/min (ref >60)
Glucose, Bld: 87 mg/dL (ref 70–99)
Potassium: 4.1 mmol/L (ref 3.5–5.1)
Sodium: 141 mmol/L (ref 135–145)
Total Bilirubin: 0.7 mg/dL (ref 0.3–1.2)
Total Protein: 7.7 g/dL (ref 6.5–8.1)

## 2019-05-04 LAB — URINALYSIS, ROUTINE W REFLEX MICROSCOPIC
Bilirubin Urine: NEGATIVE
Glucose, UA: NEGATIVE mg/dL
Hgb urine dipstick: NEGATIVE
Ketones, ur: NEGATIVE mg/dL
Leukocytes,Ua: NEGATIVE
Nitrite: NEGATIVE
Protein, ur: NEGATIVE mg/dL
Specific Gravity, Urine: 1.017 (ref 1.005–1.030)
pH: 8 (ref 5.0–8.0)

## 2019-05-04 LAB — CBC
HCT: 45.2 % (ref 39.0–52.0)
Hemoglobin: 15.3 g/dL (ref 13.0–17.0)
MCH: 29.9 pg (ref 26.0–34.0)
MCHC: 33.8 g/dL (ref 30.0–36.0)
MCV: 88.5 fL (ref 80.0–100.0)
Platelets: 144 10*3/uL — ABNORMAL LOW (ref 150–400)
RBC: 5.11 MIL/uL (ref 4.22–5.81)
RDW: 12.4 % (ref 11.5–15.5)
WBC: 3.8 10*3/uL — ABNORMAL LOW (ref 4.0–10.5)
nRBC: 0 % (ref 0.0–0.2)

## 2019-05-04 LAB — POC OCCULT BLOOD, ED: Fecal Occult Bld: NEGATIVE

## 2019-05-04 MED ORDER — LIDOCAINE VISCOUS HCL 2 % MT SOLN
15.0000 mL | Freq: Once | OROMUCOSAL | Status: AC
  Administered 2019-05-04: 15 mL via ORAL
  Filled 2019-05-04: qty 15

## 2019-05-04 MED ORDER — DICYCLOMINE HCL 20 MG PO TABS: 20.0000 mg | ORAL_TABLET | Freq: Two times a day (BID) | ORAL | 0 refills | Status: DC | PRN

## 2019-05-04 MED ORDER — DICYCLOMINE HCL 10 MG PO CAPS
10.0000 mg | ORAL_CAPSULE | Freq: Once | ORAL | Status: AC
  Administered 2019-05-04: 10 mg via ORAL
  Filled 2019-05-04: qty 1

## 2019-05-04 MED ORDER — ALUM & MAG HYDROXIDE-SIMETH 200-200-20 MG/5ML PO SUSP
30.0000 mL | Freq: Once | ORAL | Status: AC
  Administered 2019-05-04: 30 mL via ORAL
  Filled 2019-05-04: qty 30

## 2019-05-04 NOTE — ED Triage Notes (Signed)
Pt seen in ED last week and diagnosed with a hiatal hernia. Pt's father (also POA) states that they have been attempting to get an appointment with GI doctor, but they have not been able to get a response from anyone. States that pt began having black stools two days ago. Also endorses nausea without vomiting.

## 2019-05-04 NOTE — ED Provider Notes (Signed)
[facility]ERGENCY DEPARTMENT Provider Note   CSN: [id number] Arrival date & time: 05/04/19  1156     History   Chief Complaint Chief Complaint  Patient presents with  . Abdominal Pain    HPI Troy Fox is a 48 y.o. male presenting for evaluation of abdominal pain and dark stools.  Patient states he intermittently is having abdominal pain.  Pain last for about an hour before resolving without intervention.  Pain is worse after eating.  This is been present for a week and a half.  Over the past 2 to 3 days, patient has noted stools are dark and black.  Patient's father called their primary care, who recommended he come to the ER for further evaluation.  Patient was seen in the ER last week, had reassuring labs and CAT at that time.  Was found to have a moderate to large hiatal hernia, but no obstruction, infection, or surgical abdomen.  Patient has been taking Protonix as prescribed.  Recently started Maalox for abdominal pain, which proves his symptoms.  Has not taken anything else for pain.  No recent fevers, chills, chest pain, shortness of breath, nausea, vomiting, urinary symptoms.  Patient has an appointment with GI in 2 weeks. He is not on blood thinners.      HPI  Past Medical History:  Diagnosis Date  . Bipolar 1 disorder (HCC)   . 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 GERD (gastroesophageal reflux disease)   . Nonverbal   . Schizoaffective disorder (HCC)   . Tardive dyskinesia     Patient Active Problem List   Diagnosis Date Noted  . Schizoaffective disorder, depressive type (HCC) 01/29/2019  . Catatonia 01/29/2019  . Mild malnutrition (HCC) 01/29/2019  . Schizoaffective disorder, bipolar type (HCC) 01/03/2019  . Acute neuroleptic-induced akathisia 01/03/2019    History reviewed. No pertinent surgical history.      Home Medications    Prior to Admission medications   Medication Sig Start Date End Date Taking? Authorizing Provider  acetaminophen (TYLENOL)  650 MG CR tablet Take 1,300 mg by mouth every 8 (eight) hours as needed for pain.   Yes [provider]  alum & mag hydroxide-simeth (MYLANTA) 200-200-20 MG/5ML suspension Take 30 mLs by mouth every 6 (six) hours as needed for indigestion or heartburn.   Yes [provider]  cyanocobalamin 1000 MCG tablet Take 1 tablet (1,000 mcg total) by mouth daily. 03/05/19  Yes Clapacs, Jackquline DenmarkJohn T, MD  FLUoxetine (PROZAC) 40 MG capsule TAKE 1 CAPSULE BY MOUTH DAILY. 04/09/19  Yes Clapacs, Jackquline DenmarkJohn T, MD  OLANZapine zydis (ZYPREXA) 10 MG disintegrating tablet Take 1 tablet (10 mg total) by mouth at bedtime. 03/05/19  Yes Clapacs, Jackquline DenmarkJohn T, MD  pantoprazole (PROTONIX) 40 MG tablet Take 1 tablet (40 mg total) by mouth daily. 03/05/19  Yes Clapacs, Jackquline DenmarkJohn T, MD  traZODone (DESYREL) 100 MG tablet Take 1 tablet (100 mg total) by mouth at bedtime. 03/05/19  Yes Clapacs, Jackquline DenmarkJohn T, MD  Valbenazine Tosylate [facility](INGREZZA) 80 MG CAPS Take 30 mg by mouth daily. Patient taking differently: Take 40 mg by mouth daily.  03/26/19  Yes Pucilowski, Olgierd A, MD  dicyclomine (BENTYL) 20 MG tablet Take 1 tablet (20 mg total) by mouth 2 (two) times daily as needed for spasms. 05/04/19   Lanaiya Lantry, PA-C    Family History Family History  Adopted: Yes  Family history unknown: Yes    Social History Social History   Tobacco Use  . Smoking status: Never  Smoker  . Smokeless tobacco: Never Used  Substance Use Topics  . Alcohol use: Never    Frequency: Never  . Drug use: Never     Allergies   Aripiprazole   Review of Systems Review of Systems  Gastrointestinal: Positive for abdominal pain (intermittent, none currently) and blood in stool.  All other systems reviewed and are negative.    Physical Exam Updated Vital Signs BP (!) 134/91 (BP Location: Left Arm)   Pulse (!) 107   Temp 97.8 F (36.6 C) (Oral)   Resp 18   Ht 5\' 5"  (1.651 m)   Wt 74.8 kg   SpO2 99%   BMI 27.46 kg/m   Physical Exam Vitals  signs and nursing note reviewed. Exam conducted with a chaperone present.  Constitutional:      General: He is not in acute distress.    Appearance: He is well-developed.     Comments: Appears nontoxic  HENT:     Head: Normocephalic and atraumatic.  Eyes:     Conjunctiva/sclera: Conjunctivae normal.     Pupils: Pupils are equal, round, and reactive to light.  Neck:     Musculoskeletal: Normal range of motion and neck supple.  Cardiovascular:     Rate and Rhythm: Normal rate and regular rhythm.     Pulses: Normal pulses.  Pulmonary:     Effort: Pulmonary effort is normal. No respiratory distress.     Breath sounds: Normal breath sounds. No wheezing.  Abdominal:     General: There is no distension.     Palpations: Abdomen is soft. There is no mass.     Tenderness: There is no abdominal tenderness. There is no guarding or rebound.     Comments: No ttp of the abd. Soft without rigidity, guarding, distention. Negative rebound.   Genitourinary:    Comments: No gross blood on rectal. No hemorrhoids. No hemoccult.  Musculoskeletal: Normal range of motion.  Skin:    General: Skin is warm and dry.     Capillary Refill: Capillary refill takes less than 2 seconds.  Neurological:     Mental Status: He is alert and oriented to person, place, and time.      ED Treatments / Results  Labs (all labs ordered are listed, but only abnormal results are displayed) Labs Reviewed  CBC - Abnormal; Notable for the following components:      Result Value   WBC 3.8 (*)    Platelets 144 (*)    All other components within normal limits  URINALYSIS, ROUTINE W REFLEX MICROSCOPIC - Abnormal; Notable for the following components:   APPearance CLOUDY (*)    All other components within normal limits  COMPREHENSIVE METABOLIC PANEL  POC OCCULT BLOOD, ED    EKG None  Radiology No results found.  Procedures Procedures (including critical care time)  Medications Ordered in ED Medications  alum &  mag hydroxide-simeth (MAALOX/MYLANTA) 200-200-20 MG/5ML suspension 30 mL (30 mLs Oral Given 05/04/19 1510)    And  lidocaine (XYLOCAINE) 2 % viscous mouth solution 15 mL (15 mLs Oral Given 05/04/19 1510)  dicyclomine (BENTYL) capsule 10 mg (10 mg Oral Given 05/04/19 1604)     Initial Impression / Assessment and Plan / ED Course  I have reviewed the triage vital signs and the nursing notes.  Pertinent labs & imaging results that were available during my care of the patient were reviewed by me and considered in my medical decision making (see chart for details).  Patient presenting for evaluation of intermittent abdominal pain and dark stools.  Physical exam reassuring, appears nontoxic.  No abdominal tenderness.  Rectal exam without gross blood, Hemoccult negative.  Labs obtained from triage overall reassuring.  Hemoglobin stable.  Electrolytes stable.  Slight leukopenia at 3.8.  Urine without infection.  Will treat symptomatically with GI cocktail and Bentyl.  Encourage follow-up with GI.  I do not believe he needs repeat CT scan today, symptoms are likely due to hiatal hernia.  At this time, patient appears safe for discharge.  Return precautions given.  Patient and father state they understand and agree with plan.  Final Clinical Impressions(s) / ED Diagnoses   Final diagnoses:  Abdominal pain, unspecified abdominal location  Hiatal hernia  Dark stools    ED Discharge Orders         Ordered    dicyclomine (BENTYL) 20 MG tablet  2 times daily PRN     05/04/19 1558           Taisia Fantini, PA-C 05/04/19 1730    Samuel JesterMcManus, Kathleen, DO 05/10/19 0830

## 2019-05-04 NOTE — Discharge Instructions (Addendum)
Continue taking home medications as prescribed. Continue using Maalox and Tylenol as needed for pain. You may also use Bentyl as needed for abdominal pain. Follow-up with your GI doctor at your scheduled appointment. Return to the emergency room if you develop high fevers, severe worsening/persistent pain, or any new, worsening, concerning symptoms.

## 2019-05-04 NOTE — ED Notes (Signed)
Water given to pt 

## 2019-05-15 ENCOUNTER — Ambulatory Visit (INDEPENDENT_AMBULATORY_CARE_PROVIDER_SITE_OTHER): Payer: Medicare Other | Admitting: Nurse Practitioner

## 2019-05-15 ENCOUNTER — Other Ambulatory Visit: Payer: Self-pay

## 2019-05-15 ENCOUNTER — Encounter (INDEPENDENT_AMBULATORY_CARE_PROVIDER_SITE_OTHER): Payer: Self-pay | Admitting: Nurse Practitioner

## 2019-05-15 DIAGNOSIS — R197 Diarrhea, unspecified: Secondary | ICD-10-CM | POA: Diagnosis not present

## 2019-05-15 DIAGNOSIS — R1013 Epigastric pain: Secondary | ICD-10-CM | POA: Insufficient documentation

## 2019-05-15 DIAGNOSIS — K449 Diaphragmatic hernia without obstruction or gangrene: Secondary | ICD-10-CM

## 2019-05-15 MED ORDER — DICYCLOMINE HCL 20 MG PO TABS: 20.0000 mg | ORAL_TABLET | Freq: Two times a day (BID) | ORAL | 0 refills | Status: DC | PRN

## 2019-05-15 NOTE — Progress Notes (Addendum)
Subjective:    Patient ID: Troy Fox, male    DOB: 12-29-1970, 48 y.o.   MRN: [id number]    If EGD does not provide all the information needed his management will consider upper GI series.   Troy Fox is a 48 year old male with a significant past medical history of bipolar disorder, schizophrenia, schizo affective disorder, tardive dyskinesia and GERD.  Status post [employer] fundoplication in [place] in 2005. He presents today accompanied by his father, who is his power of attorney, for further evaluation regarding upper abdominal pain and black stool.  His father provides most of the history.  Troy Fox developed upper abdominal pain a few months ago.  The upper abdominal pain worsened and he presented to [facility] emergency room on 04/27/2019.   ED course: Labs: Sodium 139.  Potassium 3.8.  Glucose 101.  Creatinine 0.82.  Calcium 9.3.  Alk phos 45.  Albumin 3.7.  Lipase 27.  AST 16.  ALT 12.  WBC 4.3.  Hemoglobin 15.0.  Hematocrit 43.2. MCV 86.2.  Platelet 128.  An abdominal/pelvic CAT scan: identified a a moderate to large hiatal hernia with a paraesophageal component.  Small left liver lesion most likely a benign cyst, an area of peripheral enhancement at the left hepatic dome consistent with a vascular shunt, the gallbladder and common bile duct were normal.  The pancreas was normal.  Mild splenomegaly was noted.  Stomach and bowel were normal.   He was prescribed Mylanta with a recommendation to schedule a GI consultation.  Protonix 40 mg once daily was continued.  He is continued to have upper abdominal pain and reported passing a black stool.  His father brought him back to any [facility] emergency room 05/04/2019 for further evaluation.  The patient was taking Pepto-Bismol 1 capful  Tid for at least 2 weeks with the last dose taken around 04/27/2019. A rectal exam done by the ED physician assistant showed guaiac negative stool. WBC 3.8. He was treated with a GI cocktail  and Bentyl then discharged home.   He presents today for further evaluation regarding his upper abdominal pain and hiatal hernia. He continue to have upper abdominal pain that occurs randomly, sometimes worse after eating. No specific food triggers. No nausea or vomiting. He had loose stools for the past 3 days but passes a firmer stool today. No further black stools. No NSAIDS. No alcohol use.    His father reported that Troy Fox underwent an EGD January 2020 at [facility] in [place].  At that time, he was admitted to the hospital in a catatonic state secondary to his psychiatric disorders.  It is unclear what his symptoms were at that time which required an EGD.  I will request a copy of the EGD results for further review.  He has been on Protonix 40 mg once daily since January 2020.  He was previously living with his sister in [place].  He moved to [place] to live with his parents 10/2018. He was also admitted to [facility] 11/2018 for psychiatric decompensation which required ECT.  Past Medical History:  Diagnosis Date   Bipolar 1 disorder ([place])    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[place])    GERD (gastroesophageal reflux disease)    Nonverbal    Schizoaffective disorder (HCC)    Tardive dyskinesia    No past surgical history on file.  Nissen Fundoplication 1937 as reported by his father  Current Outpatient Medications on File Prior to Visit  Medication Sig Dispense Refill  acetaminophen (TYLENOL) 650 MG CR tablet Take 1,300 mg by mouth every 8 (eight) hours as needed for pain.     alum & mag hydroxide-simeth (MYLANTA) 937-342-87 MG/5ML suspension Take 30 mLs by mouth every 6 (six) hours as needed for indigestion or heartburn.     cyanocobalamin 1000 MCG tablet Take 1 tablet (1,000 mcg total) by mouth daily. 30 tablet 1   FLUoxetine (PROZAC) 40 MG capsule TAKE 1 CAPSULE BY MOUTH DAILY. 30 capsule 0   OLANZapine zydis (ZYPREXA) 10 MG disintegrating tablet  Take 1 tablet (10 mg total) by mouth at bedtime. 30 tablet 1   pantoprazole (PROTONIX) 40 MG tablet Take 1 tablet (40 mg total) by mouth daily. 30 tablet 1   traZODone (DESYREL) 100 MG tablet Take 1 tablet (100 mg total) by mouth at bedtime. 30 tablet 1   Valbenazine Tosylate (INGREZZA) 80 MG CAPS Take 30 mg by mouth daily. (Patient taking differently: Take 40 mg by mouth daily. ) 30 capsule 0   No current facility-administered medications on file prior to visit.    Allergies  Allergen Reactions   Aripiprazole    Social History   Socioeconomic History   Marital status: Single    Spouse name: Not on file   Number of children: Not on file   Years of education: Not on file   Highest education level: Not on file  Occupational History   Not on file  Social Needs   Financial resource strain: Not on file   Food insecurity    Worry: Not on file    Inability: Not on file   Transportation needs    Medical: Not on file    Non-medical: Not on file  Tobacco Use   Smoking status: Never Smoker   Smokeless tobacco: Never Used  Substance and Sexual Activity   Alcohol use: Never    Frequency: Never   Drug use: Never   Sexual activity: Not Currently  Lifestyle   Physical activity    Days per week: Not on file    Minutes per session: Not on file   Stress: Not on file  Relationships   Social connections    Talks on phone: Not on file    Gets together: Not on file    Attends religious service: Not on file    Active member of club or organization: Not on file    Attends meetings of clubs or organizations: Not on file    Relationship status: Not on file   Intimate partner violence    Fear of current or ex partner: Not on file    Emotionally abused: Not on file    Physically abused: Not on file    Forced sexual activity: Not on file  Other Topics Concern   Not on file  Social History Narrative   Not on file   Family History  Adopted: Yes  Family history  unknown: Yes   Review of Systems see HPI, all other systems reviewed and are negative     Objective:   Physical Exam  Blood pressure 118/78, pulse 91, temperature 97.9 F (36.6 C), height '5\' 5"'$  (1.651 m), weight 156 lb 8 oz (71 kg).  General: 48 year old conversant male in no acute distress Eyes: Sclera nonicteric, conjunctiva pink Mouth: Poor dentition, no ulcers Neck: Supple, no thyromegaly or lymphadenopathy Heart: Regular rate and rhythm, no murmurs Lungs: Breath sounds clear throughout Abdomen: Mild epigastric tenderness without rebound or guarding, positive bowel sounds to all 4 quadrants, no  HSM appreciated Rectal: Deferred Extremities: No edema Neuro: Alert and oriented x3, answers questions appropriately, patient is calm and cooperative     Assessment & Plan:   65.  48 year old male with GERD, past Nissen Fundoplication presents with  a large hiatal hernia with a paraesophageal component per CT, epigastric pain, questionable melena in setting of recent Pepto-Bismol use -Continue pantoprazole 40 mg once daily -Schedule  EGD to rule out PUD, Cameron lesions, assess HH,  benefits and risks discussed with the patient and his father who is POA -I will consult with Dr. Laural Golden to see if he wants the patient to have a barium swallow to further assess a paraesophageal hernia component  2.  Colon cancer screening discussed with patient and father -Patient will follow-up in the office 3 to 4 months after his EGD to further discuss colon cancer screening i.e. colonoscopy  3.  Mild splenomegaly per abdominal/pelvic CT  4.  Mild thrombocytopenia secondary to splenomegaly of unclear etiology.  Platelet count 144.  No obvious signs of cirrhosis.  Albumin 4.0.  AST 16.  ALT 14.  Alk phos 51.  5.  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  6. Significant psychiatric disorders s/p ECT

## 2019-05-15 NOTE — Patient Instructions (Addendum)
1. Schedule an EGD (upper endoscopy)   2. Continue Protonix 40mg  once daily  3. Dicyclomine 20mg  one tab by mouth twice daily as needed for abdominal pain  4. Call our office if your abdominal pain worsens   5.  Avoid spicy foods  6. We discussed scheduling a screening colonoscopy in 3 to 4 months

## 2019-05-17 DIAGNOSIS — R079 Chest pain, unspecified: Secondary | ICD-10-CM | POA: Diagnosis not present

## 2019-05-17 DIAGNOSIS — Z136 Encounter for screening for cardiovascular disorders: Secondary | ICD-10-CM | POA: Diagnosis not present

## 2019-05-17 DIAGNOSIS — Z0000 Encounter for general adult medical examination without abnormal findings: Secondary | ICD-10-CM | POA: Diagnosis not present

## 2019-05-17 DIAGNOSIS — R1010 Upper abdominal pain, unspecified: Secondary | ICD-10-CM | POA: Diagnosis not present

## 2019-05-17 DIAGNOSIS — R161 Splenomegaly, not elsewhere classified: Secondary | ICD-10-CM | POA: Diagnosis not present

## 2019-05-17 DIAGNOSIS — Z712 Person consulting for explanation of examination or test findings: Secondary | ICD-10-CM | POA: Diagnosis not present

## 2019-05-17 DIAGNOSIS — M7918 Myalgia, other site: Secondary | ICD-10-CM | POA: Diagnosis not present

## 2019-05-23 ENCOUNTER — Encounter (INDEPENDENT_AMBULATORY_CARE_PROVIDER_SITE_OTHER): Payer: Self-pay | Admitting: *Deleted

## 2019-06-12 ENCOUNTER — Encounter (HOSPITAL_COMMUNITY): Payer: Self-pay

## 2019-06-12 ENCOUNTER — Other Ambulatory Visit: Payer: Self-pay

## 2019-06-13 ENCOUNTER — Other Ambulatory Visit (HOSPITAL_COMMUNITY)
Admission: RE | Admit: 2019-06-13 | Discharge: 2019-06-13 | Disposition: A | Discharge status: Home / Self Care | Payer: Medicare Other | Source: Ambulatory Visit | Attending: Internal Medicine | Admitting: Internal Medicine

## 2019-06-13 ENCOUNTER — Encounter (HOSPITAL_COMMUNITY)
Admission: RE | Admit: 2019-06-13 | Discharge: 2019-06-13 | Disposition: A | Discharge status: Home / Self Care | Payer: Medicare Other | Source: Ambulatory Visit | Attending: Internal Medicine | Admitting: Internal Medicine

## 2019-06-13 ENCOUNTER — Other Ambulatory Visit: Payer: Self-pay | Admitting: *Deleted

## 2019-06-13 DIAGNOSIS — Z20828 Contact with and (suspected) exposure to other viral communicable diseases: Secondary | ICD-10-CM | POA: Insufficient documentation

## 2019-06-14 DIAGNOSIS — R1013 Epigastric pain: Secondary | ICD-10-CM

## 2019-06-14 DIAGNOSIS — K2970 Gastritis, unspecified, without bleeding: Secondary | ICD-10-CM

## 2019-06-14 DIAGNOSIS — K449 Diaphragmatic hernia without obstruction or gangrene: Secondary | ICD-10-CM | POA: Diagnosis not present

## 2019-06-14 LAB — SARS CORONAVIRUS 2 (TAT 6-24 HRS): SARS Coronavirus 2: NEGATIVE

## 2019-06-15 ENCOUNTER — Encounter (HOSPITAL_COMMUNITY)
Admission: RE | Disposition: A | Discharge status: Home / Self Care | Payer: Self-pay | Source: Home / Self Care | Attending: Internal Medicine

## 2019-06-15 ENCOUNTER — Ambulatory Visit (HOSPITAL_COMMUNITY): Payer: Medicare Other | Admitting: Anesthesiology

## 2019-06-15 ENCOUNTER — Ambulatory Visit (HOSPITAL_COMMUNITY)
Admission: RE | Admit: 2019-06-15 | Discharge: 2019-06-15 | Disposition: A | Discharge status: Home / Self Care | Payer: Medicare Other | Attending: Internal Medicine | Admitting: Internal Medicine

## 2019-06-15 DIAGNOSIS — K449 Diaphragmatic hernia without obstruction or gangrene: Secondary | ICD-10-CM | POA: Diagnosis not present

## 2019-06-15 DIAGNOSIS — K219 Gastro-esophageal reflux disease without esophagitis: Secondary | ICD-10-CM | POA: Diagnosis not present

## 2019-06-15 DIAGNOSIS — K2950 Unspecified chronic gastritis without bleeding: Secondary | ICD-10-CM | POA: Diagnosis not present

## 2019-06-15 DIAGNOSIS — F329 Major depressive disorder, single episode, unspecified: Secondary | ICD-10-CM | POA: Diagnosis not present

## 2019-06-15 DIAGNOSIS — R1013 Epigastric pain: Secondary | ICD-10-CM | POA: Insufficient documentation

## 2019-06-15 DIAGNOSIS — Z79899 Other long term (current) drug therapy: Secondary | ICD-10-CM | POA: Diagnosis not present

## 2019-06-15 HISTORY — PX: ESOPHAGOGASTRODUODENOSCOPY (EGD) WITH PROPOFOL: SHX5813

## 2019-06-15 HISTORY — PX: BIOPSY: SHX5522

## 2019-06-15 SURGERY — ESOPHAGOGASTRODUODENOSCOPY (EGD) WITH PROPOFOL
Anesthesia: General

## 2019-06-15 MED ORDER — PROPOFOL 10 MG/ML IV BOLUS
INTRAVENOUS | Status: DC | PRN
  Administered 2019-06-15: 20 mg via INTRAVENOUS

## 2019-06-15 MED ORDER — HYDROCODONE-ACETAMINOPHEN 7.5-325 MG PO TABS: 1.0000 | ORAL_TABLET | Freq: Once | ORAL | Status: DC | PRN

## 2019-06-15 MED ORDER — PROPOFOL 500 MG/50ML IV EMUL
INTRAVENOUS | Status: DC | PRN
  Administered 2019-06-15: 150 ug/kg/min via INTRAVENOUS

## 2019-06-15 MED ORDER — MIDAZOLAM HCL 2 MG/2ML IJ SOLN
INTRAMUSCULAR | Status: AC
  Filled 2019-06-15: qty 2

## 2019-06-15 MED ORDER — STERILE WATER FOR IRRIGATION IR SOLN
Status: DC | PRN
  Administered 2019-06-15: 2.5 mL

## 2019-06-15 MED ORDER — PROMETHAZINE HCL 25 MG/ML IJ SOLN: 6.2500 mg | INTRAMUSCULAR | Status: DC | PRN

## 2019-06-15 MED ORDER — PROPOFOL 10 MG/ML IV BOLUS
INTRAVENOUS | Status: AC
  Filled 2019-06-15: qty 40

## 2019-06-15 MED ORDER — MIDAZOLAM HCL 5 MG/5ML IJ SOLN
INTRAMUSCULAR | Status: DC | PRN
  Administered 2019-06-15: 1 mg via INTRAVENOUS

## 2019-06-15 MED ORDER — MIDAZOLAM HCL 2 MG/2ML IJ SOLN: 0.5000 mg | Freq: Once | INTRAMUSCULAR | Status: DC | PRN

## 2019-06-15 MED ORDER — HYDROMORPHONE HCL 1 MG/ML IJ SOLN: 0.2500 mg | INTRAMUSCULAR | Status: DC | PRN

## 2019-06-15 MED ORDER — LACTATED RINGERS IV SOLN
INTRAVENOUS | Status: DC
  Administered 2019-06-15: 07:00:00 via INTRAVENOUS

## 2019-06-15 MED ORDER — GLYCOPYRROLATE 0.2 MG/ML IJ SOLN
INTRAMUSCULAR | Status: AC
  Filled 2019-06-15: qty 2

## 2019-06-15 MED ORDER — KETAMINE HCL 50 MG/5ML IJ SOSY
PREFILLED_SYRINGE | INTRAMUSCULAR | Status: AC
  Filled 2019-06-15: qty 5

## 2019-06-15 NOTE — Anesthesia Preprocedure Evaluation (Signed)
Anesthesia Evaluation  Patient identified by MRN, date of birth, ID band Patient awake    Reviewed: Allergy & Precautions, NPO status , Patient's Chart, lab work & pertinent test results  Airway Mallampati: II  TM Distance: >3 FB Neck ROM: Full    Dental no notable dental hx. (+) Teeth Intact   Pulmonary neg pulmonary ROS,    Pulmonary exam normal breath sounds clear to auscultation       Cardiovascular Exercise Tolerance: Good negative cardio ROS Normal cardiovascular examI Rhythm:Regular Rate:Normal     Neuro/Psych PSYCHIATRIC DISORDERS Depression Bipolar Disorder Schizophrenia negative neurological ROS     GI/Hepatic Neg liver ROS, hiatal hernia, GERD  Medicated and Controlled,  Endo/Other  negative endocrine ROS  Renal/GU negative Renal ROS  negative genitourinary   Musculoskeletal negative musculoskeletal ROS (+)   Abdominal   Peds negative pediatric ROS (+)  Hematology negative hematology ROS (+)   Anesthesia Other Findings   Reproductive/Obstetrics negative OB ROS                             Anesthesia Physical Anesthesia Plan  ASA: II  Anesthesia Plan: General   Post-op Pain Management:    Induction: Intravenous  PONV Risk Score and Plan: 2 and Propofol infusion, TIVA and Treatment may vary due to age or medical condition  Airway Management Planned: Nasal Cannula and Simple Face Mask  Additional Equipment:   Intra-op Plan:   Post-operative Plan:   Informed Consent: I have reviewed the patients History and Physical, chart, labs and discussed the procedure including the risks, benefits and alternatives for the proposed anesthesia with the patient or authorized representative who has indicated his/her understanding and acceptance.     Dental advisory given  Plan Discussed with: CRNA  Anesthesia Plan Comments: (Plan Full PPE use  Plan GA with GETA as needed d/w  pt -WTP with same after Q&A)        Anesthesia Quick Evaluation

## 2019-06-15 NOTE — Op Note (Signed)
[facility] Patient Name: Troy Fox Procedure Date: 06/15/2019 7:33 AM MRN: [id number] Date of Birth: May 25, 1971 Attending MD: Hildred Laser , MD CSN: [id number] Age: 48 Admit Type: Outpatient Procedure:                Upper GI endoscopy Indications:              Epigastric abdominal pain and history of ? melena Providers:                Hildred Laser, MD, Lurline Del, RN, Nelma Rothman,                            Technician Referring MD:             Delphina Cahill, MD Medicines:                Propofol per Anesthesia Complications:            No immediate complications. Estimated Blood Loss:     Estimated blood loss was minimal. Procedure:                Pre-Anesthesia Assessment:                           - Prior to the procedure, a History and Physical                            was performed, and patient medications and                            allergies were reviewed. The patient's tolerance of                            previous anesthesia was also reviewed. The risks                            and benefits of the procedure and the sedation                            options and risks were discussed with the patient.                            All questions were answered, and informed consent                            was obtained. Prior Anticoagulants: The patient has                            taken no previous anticoagulant or antiplatelet                            agents. ASA Grade Assessment: II - A patient with                            mild systemic disease. After reviewing the risks  and benefits, the patient was deemed in                            satisfactory condition to undergo the procedure.                           After obtaining informed consent, the endoscope was                            passed under direct vision. Throughout the                            procedure, the patient's blood pressure, pulse, and        oxygen saturations were monitored continuously. The                            [id number]-[id number] ([id number]) scope was introduced through the                            mouth, and advanced to the second part of duodenum.                            The upper GI endoscopy was accomplished without                            difficulty. The patient tolerated the procedure                            well. Scope In: 7:44:42 AM Scope Out: 7:51:13 AM Total Procedure Duration: 0 hours 6 minutes 31 seconds  Findings:      The hypopharynx was normal.      The examined esophagus was normal.      The Z-line was regular and was found 35 cm from the incisors.      A medium-sized paraesophageal hernia was found.      Patchy mild inflammation characterized by congestion (edema) and       erythema was found in the gastric antrum. Biopsies were taken with a       cold forceps for histology.      The duodenal bulb and second portion of the duodenum were normal. Impression:               - Normal hypopharynx.                           - Normal esophagus.                           - Z-line regular, 35 cm from the incisors.                           - Medium-sized paraesophageal hernia.                           - Gastritis. Biopsied.                           -  Normal duodenal bulb and second portion of the                            duodenum.                           Comment; no fundal wrap noted. Moderate Sedation:      Per Anesthesia Care Recommendation:           - Patient has a contact number available for                            emergencies. The signs and symptoms of potential                            delayed complications were discussed with the                            patient. Return to normal activities tomorrow.                            Written discharge instructions were provided to the                            patient.                           - Resume previous diet today.                            - No aspirin, ibuprofen, naproxen, or other                            non-steroidal anti-inflammatory drugs for 1 day.                           - Await pathology results.                           - UGIS to be scheduled. Procedure Code(s):        --- Professional ---                           [phone] [id number], Esophagogastroduodenoscopy, flexible,                            transoral; with biopsy, single or multiple Diagnosis Code(s):        --- Professional ---                           K44.9, Diaphragmatic hernia without obstruction or                            gangrene                           K29.70, Gastritis, unspecified, without bleeding  R10.13, Epigastric pain CPT copyright 2019 [employer] Association. All rights reserved. The codes documented in this report are preliminary and upon coder review may  be revised to meet current compliance requirements. Lionel December, MD Lionel December, MD 06/15/2019 8:01:24 AM This report has been signed electronically. Number of Addenda: 0

## 2019-06-15 NOTE — Discharge Instructions (Signed)
Resume usual medications and diet as before. Physician will call with biopsy results. Upper GI series to be scheduled.  Office will call.     Upper Endoscopy, Adult, Care After This sheet gives you information about how to care for yourself after your procedure. Your health care provider may also give you more specific instructions. If you have problems or questions, contact your health care provider. What can I expect after the procedure? After the procedure, it is common to have:  A sore throat.  Mild stomach pain or discomfort.  Bloating.  Nausea. Follow these instructions at home:   Follow instructions from your health care provider about what to eat or drink after your procedure.  Return to your normal activities as told by your health care provider. Ask your health care provider what activities are safe for you.  Take over-the-counter and prescription medicines only as told by your health care provider.  Do not drive for 24 hours if you were given a sedative during your procedure.  Keep all follow-up visits as told by your health care provider. This is important. Contact a health care provider if you have:  A sore throat that lasts longer than one day.  Trouble swallowing. Get help right away if:  You vomit blood or your vomit looks like coffee grounds.  You have: ? A fever. ? Bloody, black, or tarry stools. ? A severe sore throat or you cannot swallow. ? Difficulty breathing. ? Severe pain in your chest or abdomen. Summary  After the procedure, it is common to have a sore throat, mild stomach discomfort, bloating, and nausea.  Do not drive for 24 hours if you were given a sedative during the procedure.  Follow instructions from your health care provider about what to eat or drink after your procedure.  Return to your normal activities as told by your health care provider. This information is not intended to replace advice given to you by your health care  provider. Make sure you discuss any questions you have with your health care provider. Document Released: 02/29/2012 Document Revised: 02/21/2018 Document Reviewed: 01/30/2018 Elsevier Patient Education  2020 [employer]     Monitored Anesthesia Care, Care After These instructions provide you with information about caring for yourself after your procedure. Your health care provider may also give you more specific instructions. Your treatment has been planned according to current medical practices, but problems sometimes occur. Call your health care provider if you have any problems or questions after your procedure. What can I expect after the procedure? After your procedure, you may:  Feel sleepy for several hours.  Feel clumsy and have poor balance for several hours.  Feel forgetful about what happened after the procedure.  Have poor judgment for several hours.  Feel nauseous or vomit.  Have a sore throat if you had a breathing tube during the procedure. Follow these instructions at home: For at least 24 hours after the procedure:      Have a responsible adult stay with you. It is important to have someone help care for you until you are awake and alert.  Rest as needed.  Do not: ? Participate in activities in which you could fall or become injured. ? Drive. ? Use heavy machinery. ? Drink alcohol. ? Take sleeping pills or medicines that cause drowsiness. ? Make important decisions or sign legal documents. ? Take care of children on your own. Eating and drinking  Follow the diet that is recommended by your  health care provider.  If you vomit, drink water, juice, or soup when you can drink without vomiting.  Make sure you have little or no nausea before eating solid foods. General instructions  Take over-the-counter and prescription medicines only as told by your health care provider.  If you have sleep apnea, surgery and certain medicines can increase your risk  for breathing problems. Follow instructions from your health care provider about wearing your sleep device: ? Anytime you are sleeping, including during daytime naps. ? While taking prescription pain medicines, sleeping medicines, or medicines that make you drowsy.  If you smoke, do not smoke without supervision.  Keep all follow-up visits as told by your health care provider. This is important. Contact a health care provider if:  You keep feeling nauseous or you keep vomiting.  You feel light-headed.  You develop a rash.  You have a fever. Get help right away if:  You have trouble breathing. Summary  For several hours after your procedure, you may feel sleepy and have poor judgment.  Have a responsible adult stay with you for at least 24 hours or until you are awake and alert. This information is not intended to replace advice given to you by your health care provider. Make sure you discuss any questions you have with your health care provider. Document Released: 12/21/2015 Document Revised: 11/28/2017 Document Reviewed: 12/21/2015 Elsevier Patient Education  2020 [employer].

## 2019-06-15 NOTE — Anesthesia Postprocedure Evaluation (Signed)
Anesthesia Post Note  Patient: Troy Fox  Procedure(s) Performed: ESOPHAGOGASTRODUODENOSCOPY (EGD) WITH PROPOFOL (N/A ) BIOPSY  Patient location during evaluation: PACU Anesthesia Type: General Level of consciousness: awake and alert and oriented Pain management: pain level controlled Vital Signs Assessment: post-procedure vital signs reviewed and stable Respiratory status: spontaneous breathing Cardiovascular status: blood pressure returned to baseline and stable Postop Assessment: no apparent nausea or vomiting Anesthetic complications: no     Last Vitals:  Vitals:   06/15/19 0645  BP: 130/90  Pulse: 94  Resp: 18  Temp: 36.6 C  SpO2: 94%    Last Pain: There were no vitals filed for this visit.               Cy Bresee

## 2019-06-15 NOTE — Transfer of Care (Signed)
Immediate Anesthesia Transfer of Care Note  Patient: Troy Fox  Procedure(s) Performed: ESOPHAGOGASTRODUODENOSCOPY (EGD) WITH PROPOFOL (N/A ) BIOPSY  Patient Location: PACU  Anesthesia Type:General  Level of Consciousness: awake  Airway & Oxygen Therapy: Patient Spontanous Breathing  Post-op Assessment: Report given to RN  Post vital signs: Reviewed  Last Vitals:  Vitals Value Taken Time  BP 118/79 06/15/19 0800  Temp    Pulse 88 06/15/19 0801  Resp 15 06/15/19 0801  SpO2 94 % 06/15/19 0801  Vitals shown include unvalidated device data.  Last Pain: There were no vitals filed for this visit.       Complications: No apparent anesthesia complications

## 2019-06-15 NOTE — H&P (Signed)
Troy Fox is an 48 y.o. male.   Chief Complaint: Patient is here for esophagogastroduodenoscopy. HPI:  History provided by patient's father Mr. Troy Fox who is at bedside.  He also has a power of attorney. Patient is a 48 year old Caucasian male with multiple psychiatric diagnoses who presents with epigastric midabdominal as well as intrascapular pain which he has had for several months.  He has had nausea.  He also gives history of black stools but he has been taking Pepto-Bismol.  He does not vomit.  His dad states that his stools have been dark since he stopped Pepto-Bismol but they are not normal in color.  No history of rectal bleeding.  His weight has been stable.  He had EGD while he was living in [place] and was told he had large hiatal hernia.  He does not take OTC NSAIDs.  He does not smoke cigarettes or drink alcohol.  He has been receiving ECT intermittently which has helped. Patient is now living with his parents.   Past Medical History:  Diagnosis Date  . Bipolar 1 disorder ([place])   . 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 GERD (gastroesophageal reflux disease)   . Nonverbal   . Schizoaffective disorder ([place])   . Tardive dyskinesia     No past surgical history on file.  Family History  Adopted: Yes  Family history unknown: Yes   Social History:  reports that he has never smoked. He has never used smokeless tobacco. He reports that he does not drink alcohol or use drugs.  Allergies:  Allergies  Allergen Reactions  . Aripiprazole Other (See Comments)    Causes gambling addiction  *abilify     Medications Prior to Admission  Medication Sig Dispense Refill  . acetaminophen (TYLENOL) 500 MG tablet Take 1,000 mg by mouth every 8 (eight) hours as needed for moderate pain.    Marland Kitchen alum & mag hydroxide-simeth (MYLANTA) 161-096-04 MG/5ML suspension Take 30 mLs by mouth every 6 (six) hours as needed for indigestion or heartburn.    . cyanocobalamin 1000 MCG tablet Take 1  tablet (1,000 mcg total) by mouth daily. 30 tablet 1  . dicyclomine (BENTYL) 20 MG tablet Take 1 tablet (20 mg total) by mouth 2 (two) times daily as needed for spasms. 20 tablet 0  . FLUoxetine (PROZAC) 40 MG capsule TAKE 1 CAPSULE BY MOUTH DAILY. 30 capsule 0  . OLANZapine (ZYPREXA) 10 MG tablet Take 10 mg by mouth at bedtime.     Marland Kitchen omeprazole (PRILOSEC) 40 MG capsule Take 40 mg by mouth daily.    . traZODone (DESYREL) 100 MG tablet Take 1 tablet (100 mg total) by mouth at bedtime. 30 tablet 1  . Valbenazine Tosylate (INGREZZA) 80 MG CAPS Take 30 mg by mouth daily. (Patient taking differently: Take 80 mg by mouth daily. ) 30 capsule 0    No results found for this or any previous visit (from the past 48 hour(s)). No results found.  ROS  Blood pressure 130/90, pulse 94, temperature 97.9 F (36.6 C), resp. rate 18, SpO2 94 %. Physical Exam  Constitutional:  Well-developed thin Caucasian male who has flat facies.  He responds appropriately to questions.  HENT:  Mouth/Throat: Oropharynx is clear and moist.  Eyes: Conjunctivae are normal. No scleral icterus.  Neck: No thyromegaly present.  Cardiovascular: Normal rate, regular rhythm and normal heart sounds.  No murmur heard. Respiratory: Effort normal and breath sounds normal.  GI:  Abdomen is symmetrical.  On palpation is soft.  He has mild periumbilical and epigastric tenderness.  No organomegaly or masses  Musculoskeletal:        General: No edema.  Lymphadenopathy:    He has no cervical adenopathy.  Neurological: He is alert.  He has intermittent movement to his legs.  Skin: Skin is warm and dry.     Assessment/Plan  Epigastric and intrascapular pain and history of melena. History of hiatal hernia. Diagnostic difficult gastroduodenoscopy under monitored anesthesia care. Patient's father Troy Fox is at bedside.  His questions were answered.  Lionel December, MD 06/15/2019, 7:23 AM

## 2019-06-18 ENCOUNTER — Other Ambulatory Visit: Payer: Self-pay

## 2019-06-18 ENCOUNTER — Telehealth (INDEPENDENT_AMBULATORY_CARE_PROVIDER_SITE_OTHER): Payer: Self-pay | Admitting: Internal Medicine

## 2019-06-18 ENCOUNTER — Emergency Department
Admission: EM | Admit: 2019-06-18 | Discharge: 2019-06-18 | Disposition: A | Discharge status: Home / Self Care | Payer: Medicare Other | Attending: Student | Admitting: Student

## 2019-06-18 DIAGNOSIS — Z79899 Other long term (current) drug therapy: Secondary | ICD-10-CM | POA: Insufficient documentation

## 2019-06-18 DIAGNOSIS — F419 Anxiety disorder, unspecified: Secondary | ICD-10-CM | POA: Diagnosis not present

## 2019-06-18 DIAGNOSIS — G2401 Drug induced subacute dyskinesia: Secondary | ICD-10-CM

## 2019-06-18 DIAGNOSIS — R251 Tremor, unspecified: Secondary | ICD-10-CM | POA: Diagnosis present

## 2019-06-18 LAB — URINALYSIS, COMPLETE (UACMP) WITH MICROSCOPIC
Bacteria, UA: NONE SEEN
Bilirubin Urine: NEGATIVE
Glucose, UA: NEGATIVE mg/dL
Hgb urine dipstick: NEGATIVE
Ketones, ur: 20 mg/dL — AB
Leukocytes,Ua: NEGATIVE
Nitrite: NEGATIVE
Protein, ur: NEGATIVE mg/dL
Specific Gravity, Urine: 1.023 (ref 1.005–1.030)
Squamous Epithelial / HPF: NONE SEEN (ref 0–5)
pH: 6 (ref 5.0–8.0)

## 2019-06-18 LAB — CBC
HCT: 43.2 % (ref 39.0–52.0)
Hemoglobin: 15.2 g/dL (ref 13.0–17.0)
MCH: 30.2 pg (ref 26.0–34.0)
MCHC: 35.2 g/dL (ref 30.0–36.0)
MCV: 85.9 fL (ref 80.0–100.0)
Platelets: 176 10*3/uL (ref 150–400)
RBC: 5.03 MIL/uL (ref 4.22–5.81)
RDW: 12.9 % (ref 11.5–15.5)
WBC: 4.7 10*3/uL (ref 4.0–10.5)
nRBC: 0 % (ref 0.0–0.2)

## 2019-06-18 LAB — BASIC METABOLIC PANEL
Anion gap: 10 (ref 5–15)
BUN: 14 mg/dL (ref 6–20)
CO2: 24 mmol/L (ref 22–32)
Calcium: 9.5 mg/dL (ref 8.9–10.3)
Chloride: 103 mmol/L (ref 98–111)
Creatinine, Ser: 0.71 mg/dL (ref 0.61–1.24)
GFR calc Af Amer: >60 mL/min (ref >60)
GFR calc non Af Amer: >60 mL/min (ref >60)
Glucose, Bld: 103 mg/dL — ABNORMAL HIGH (ref 70–99)
Potassium: 3.9 mmol/L (ref 3.5–5.1)
Sodium: 137 mmol/L (ref 135–145)

## 2019-06-18 LAB — SURGICAL PATHOLOGY

## 2019-06-18 MED ORDER — DIPHENHYDRAMINE HCL 50 MG/ML IJ SOLN
50.0000 mg | Freq: Once | INTRAMUSCULAR | Status: AC
  Administered 2019-06-18: 20:00:00 50 mg via INTRAVENOUS

## 2019-06-18 MED ORDER — SODIUM CHLORIDE 0.9% FLUSH: 3.0000 mL | Freq: Once | INTRAVENOUS | Status: DC

## 2019-06-18 MED ORDER — LORAZEPAM 1 MG PO TABS
1.0000 mg | ORAL_TABLET | Freq: Once | ORAL | Status: AC
  Administered 2019-06-18: 1 mg via ORAL
  Filled 2019-06-18: qty 1

## 2019-06-18 MED ORDER — DIPHENHYDRAMINE HCL 50 MG/ML IJ SOLN
INTRAMUSCULAR | Status: AC
  Administered 2019-06-18: 50 mg via INTRAVENOUS
  Filled 2019-06-18: qty 1

## 2019-06-18 MED ORDER — LORAZEPAM 2 MG/ML IJ SOLN
INTRAMUSCULAR | Status: AC
  Filled 2019-06-18: qty 1

## 2019-06-18 NOTE — ED Provider Notes (Addendum)
[facility] Emergency Department Provider Note  ____________________________________________   First MD Initiated Contact with Patient 06/18/19 1945     (approximate)  I have reviewed the triage vital signs and the nursing notes.  History  Chief Complaint Shaking    HPI Troy Fox is a 48 y.o. male with history of BPD, schizophrenia, tardive dyskinesia who presents with his father who is concerned for worsening of his tardive dyskinesia. Patient is on medication for this, but father thinks it's efficacy may be weaning. Patient is having tremors, cannot sit still, lip moving/drooling. These are all consistent with his hx of tardive dyskinesia. Father also thinks he seems more anxious. Father reports compliance with mediation. No changes to medications  Because of his shaking patient had a minor fall today, with no resultant trauma or significant injuries.   Past Medical Hx Past Medical History:  Diagnosis Date   Bipolar 1 disorder ([facility])    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[facility])    GERD (gastroesophageal reflux disease)    Nonverbal    Schizoaffective disorder ([facility])    Tardive dyskinesia     Problem List Patient Active Problem List   Diagnosis Date Noted   Abdominal pain, epigastric 05/15/2019   Diarrhea 05/15/2019   Hiatal hernia 05/15/2019   Schizoaffective disorder, depressive type (HCC) 01/29/2019   Catatonia 01/29/2019   Mild malnutrition ([facility]) 01/29/2019   Schizoaffective disorder, bipolar type ([facility]) 01/03/2019   Acute neuroleptic-induced akathisia 01/03/2019    Past Surgical Hx No past surgical history on file.  Medications Prior to Admission medications   Medication Sig Start Date End Date Taking? Authorizing Provider  acetaminophen (TYLENOL) 500 MG tablet Take 1,000 mg by mouth every 8 (eight) hours as needed for moderate pain.    [provider]  alum & mag hydroxide-simeth (MYLANTA) 200-200-20 MG/5ML suspension  Take 30 mLs by mouth every 6 (six) hours as needed for indigestion or heartburn.    [provider]  cyanocobalamin 1000 MCG tablet Take 1 tablet (1,000 mcg total) by mouth daily. 03/05/19   Clapacs, Jackquline Denmark, MD  dicyclomine (BENTYL) 20 MG tablet Take 1 tablet (20 mg total) by mouth 2 (two) times daily as needed for spasms. 05/15/19   Arnaldo Natal, NP  FLUoxetine (PROZAC) 40 MG capsule TAKE 1 CAPSULE BY MOUTH DAILY. 04/09/19   Clapacs, Jackquline Denmark, MD  OLANZapine (ZYPREXA) 10 MG tablet Take 10 mg by mouth at bedtime.  05/07/19   [provider]  omeprazole (PRILOSEC) 40 MG capsule Take 40 mg by mouth daily.    [provider]  traZODone (DESYREL) 100 MG tablet Take 1 tablet (100 mg total) by mouth at bedtime. 03/05/19   Clapacs, Jackquline Denmark, MD  Valbenazine Tosylate [facility]) 80 MG CAPS Take 30 mg by mouth daily. Patient taking differently: Take 80 mg by mouth daily.  03/26/19   Pucilowski, Roosvelt Maser, MD    Allergies Aripiprazole  Family Hx Family History  Adopted: Yes  Family history unknown: Yes    Social Hx Social History   Tobacco Use   Smoking status: Never Smoker   Smokeless tobacco: Never Used  Substance Use Topics   Alcohol use: Never    Frequency: Never   Drug use: Never     Review of Systems  Constitutional: Negative for fever, chills. Eyes: Negative for visual changes. ENT: Negative for sore throat. Cardiovascular: Negative for chest pain. Respiratory: Negative for shortness of breath. Gastrointestinal: Negative for nausea, vomiting.  Genitourinary: Negative for dysuria.  Musculoskeletal: Negative for leg swelling. Skin: Negative for rash. Neurological: + shaking, tremors   Physical Exam  Vital Signs: ED Triage Vitals [06/18/19 1739]  Enc Vitals Group     BP 121/79     Pulse Rate (!) 122     Resp 18     Temp 98.6 F (37 C)     Temp Source Oral     SpO2 96 %     Weight      Height      Head Circumference      Peak Flow        Pain Score      Pain Loc      Pain Edu?      Excl. in [place]?     Constitutional: Alert and oriented. Follows commands. Up and walking in room and seems somewhat restless.  Head: Normocephalic. Atraumatic. Eyes: Conjunctivae clear. Sclera anicteric. Nose: No congestion. No rhinorrhea. Mouth/Throat: Seems to continually fidget lips. Some drooling due to this.  Neck: No stridor.   Cardiovascular: Tachycardic on arrival, improved after medication. Extremities well perfused. Respiratory: Normal respiratory effort.   Gastrointestinal: Non-distended.  Musculoskeletal: No evidence of trauma. No deformities. Neurologic:  Ambulatory with steady gait. Tremulous, especially of upper extremities.  Skin: Skin is warm, dry and intact. No rash noted.   EKG  Personally reviewed.   Rate: 121 Rhythm: sinus Axis: normal Intervals: WNL Sinus tachycardia, no acute ischemic changes No STEMI    Radiology  N/A   Procedures  Procedure(s) performed (including critical care):  Procedures   Initial Impression / Assessment and Plan / ED Course  48 y.o. male who presents to the ED with symptoms consistent with acute on chronic exacerbation of tardive dyskinesia.   Labs initiated are unremarkable. Will give dose of Benadryl and Ativan and reassess.  After medication, patient appears much improved on exam and father states he is markedly improved as well, back to baseline. Tremors nearly completely resolved, calm, laying in bed, no repetitive tongue movements. Patient states he feels will. Will discharge, advised outpatient follow up and given return precautions.  Final Clinical Impression(s) / ED Diagnosis  Final diagnoses:  Shaking  Tardive dyskinesia       Note:  This document was prepared using Dragon voice recognition software and may include unintentional dictation errors.     Lilia Pro., MD 06/19/19 [phone]

## 2019-06-18 NOTE — Discharge Instructions (Addendum)
Thank you for letting us take care of you in the emergency department today.   Please continue to take any regular, prescribed medications.   Please follow up with: - Your regular doctor to review your ER visit and follow up on your symptoms.   Please return to the ER for any new or worsening symptoms.

## 2019-06-18 NOTE — ED Notes (Signed)
Dr. Joan Mayans at bedside to re-evaluate pt. Pt appears to be feeling much better. Pt father states he has noticed improvement in pt symptoms and would like to take him home before it gets too late to give his night medications. Pt sitting on stretcher with no distress noted.

## 2019-06-18 NOTE — ED Notes (Signed)
Dr. Monks at the bedside for pt evaluation 

## 2019-06-18 NOTE — Telephone Encounter (Signed)
Father called stated patient had procedure done on 10/2 and is having some difficulty swallowing - please advise - ph# [phone]

## 2019-06-18 NOTE — ED Triage Notes (Addendum)
Pt to the er for shaking, drooling, a fall today, unable to swallow, hyperactivity, unable to chew. Pt has a hx of psych dx and is on psych meds. Pt is tachycardic in triage. Pt has a hx of tardive dyskinesia. Pt had a fall at the day program he goes to and hurt his knees.

## 2019-06-20 ENCOUNTER — Encounter (INDEPENDENT_AMBULATORY_CARE_PROVIDER_SITE_OTHER): Payer: Self-pay

## 2019-06-20 ENCOUNTER — Other Ambulatory Visit (INDEPENDENT_AMBULATORY_CARE_PROVIDER_SITE_OTHER): Payer: Self-pay | Admitting: Internal Medicine

## 2019-06-20 DIAGNOSIS — K449 Diaphragmatic hernia without obstruction or gangrene: Secondary | ICD-10-CM

## 2019-06-22 ENCOUNTER — Encounter (HOSPITAL_COMMUNITY): Payer: Self-pay | Admitting: Internal Medicine

## 2019-06-22 ENCOUNTER — Ambulatory Visit (HOSPITAL_COMMUNITY): Payer: Medicare Other

## 2019-06-27 NOTE — Telephone Encounter (Signed)
This has been addressed. Patient is to have a UGI Single on 06/29/2019.

## 2019-06-29 ENCOUNTER — Ambulatory Visit (HOSPITAL_COMMUNITY)
Admission: RE | Admit: 2019-06-29 | Discharge: 2019-06-29 | Disposition: A | Discharge status: Home / Self Care | Payer: Medicare Other | Source: Ambulatory Visit | Attending: Internal Medicine | Admitting: Internal Medicine

## 2019-06-29 ENCOUNTER — Other Ambulatory Visit: Payer: Self-pay

## 2019-06-29 ENCOUNTER — Other Ambulatory Visit (INDEPENDENT_AMBULATORY_CARE_PROVIDER_SITE_OTHER): Payer: Self-pay | Admitting: Internal Medicine

## 2019-06-29 DIAGNOSIS — K449 Diaphragmatic hernia without obstruction or gangrene: Secondary | ICD-10-CM | POA: Diagnosis not present

## 2019-07-01 ENCOUNTER — Other Ambulatory Visit: Payer: Self-pay

## 2019-07-01 ENCOUNTER — Inpatient Hospital Stay
Admission: EM | Admit: 2019-07-01 | Discharge: 2019-07-18 | DRG: 885 | Disposition: A | Discharge status: Hospice | Payer: Medicare Other | Attending: Internal Medicine | Admitting: Internal Medicine

## 2019-07-01 ENCOUNTER — Emergency Department: Payer: Medicare Other

## 2019-07-01 ENCOUNTER — Encounter: Payer: Self-pay | Admitting: Emergency Medicine

## 2019-07-01 DIAGNOSIS — G2401 Drug induced subacute dyskinesia: Secondary | ICD-10-CM | POA: Diagnosis present

## 2019-07-01 DIAGNOSIS — K449 Diaphragmatic hernia without obstruction or gangrene: Secondary | ICD-10-CM | POA: Diagnosis present

## 2019-07-01 DIAGNOSIS — M545 Low back pain: Secondary | ICD-10-CM | POA: Diagnosis present

## 2019-07-01 DIAGNOSIS — G8929 Other chronic pain: Secondary | ICD-10-CM | POA: Diagnosis present

## 2019-07-01 DIAGNOSIS — Z515 Encounter for palliative care: Secondary | ICD-10-CM | POA: Diagnosis not present

## 2019-07-01 DIAGNOSIS — Z978 Presence of other specified devices: Secondary | ICD-10-CM

## 2019-07-01 DIAGNOSIS — F251 Schizoaffective disorder, depressive type: Secondary | ICD-10-CM | POA: Diagnosis present

## 2019-07-01 DIAGNOSIS — R627 Adult failure to thrive: Secondary | ICD-10-CM

## 2019-07-01 DIAGNOSIS — K117 Disturbances of salivary secretion: Secondary | ICD-10-CM

## 2019-07-01 DIAGNOSIS — R251 Tremor, unspecified: Secondary | ICD-10-CM

## 2019-07-01 DIAGNOSIS — Z0189 Encounter for other specified special examinations: Secondary | ICD-10-CM

## 2019-07-01 DIAGNOSIS — Z20828 Contact with and (suspected) exposure to other viral communicable diseases: Secondary | ICD-10-CM | POA: Diagnosis present

## 2019-07-01 DIAGNOSIS — R05 Cough: Secondary | ICD-10-CM

## 2019-07-01 DIAGNOSIS — K219 Gastro-esophageal reflux disease without esophagitis: Secondary | ICD-10-CM | POA: Diagnosis present

## 2019-07-01 DIAGNOSIS — Z66 Do not resuscitate: Secondary | ICD-10-CM | POA: Diagnosis not present

## 2019-07-01 DIAGNOSIS — Z888 Allergy status to other drugs, medicaments and biological substances status: Secondary | ICD-10-CM

## 2019-07-01 DIAGNOSIS — F250 Schizoaffective disorder, bipolar type: Secondary | ICD-10-CM | POA: Diagnosis present

## 2019-07-01 DIAGNOSIS — E860 Dehydration: Secondary | ICD-10-CM | POA: Diagnosis present

## 2019-07-01 DIAGNOSIS — R000 Tachycardia, unspecified: Secondary | ICD-10-CM | POA: Diagnosis present

## 2019-07-01 DIAGNOSIS — K2970 Gastritis, unspecified, without bleeding: Secondary | ICD-10-CM | POA: Diagnosis present

## 2019-07-01 DIAGNOSIS — E441 Mild protein-calorie malnutrition: Secondary | ICD-10-CM | POA: Diagnosis not present

## 2019-07-01 DIAGNOSIS — F99 Mental disorder, not otherwise specified: Secondary | ICD-10-CM

## 2019-07-01 DIAGNOSIS — F202 Catatonic schizophrenia: Secondary | ICD-10-CM

## 2019-07-01 DIAGNOSIS — E876 Hypokalemia: Secondary | ICD-10-CM | POA: Diagnosis not present

## 2019-07-01 DIAGNOSIS — R059 Cough, unspecified: Secondary | ICD-10-CM

## 2019-07-01 DIAGNOSIS — T43505A Adverse effect of unspecified antipsychotics and neuroleptics, initial encounter: Secondary | ICD-10-CM | POA: Diagnosis present

## 2019-07-01 DIAGNOSIS — F319 Bipolar disorder, unspecified: Secondary | ICD-10-CM

## 2019-07-01 DIAGNOSIS — R109 Unspecified abdominal pain: Secondary | ICD-10-CM

## 2019-07-01 DIAGNOSIS — R1310 Dysphagia, unspecified: Secondary | ICD-10-CM

## 2019-07-01 DIAGNOSIS — Z79899 Other long term (current) drug therapy: Secondary | ICD-10-CM

## 2019-07-01 DIAGNOSIS — Z4659 Encounter for fitting and adjustment of other gastrointestinal appliance and device: Secondary | ICD-10-CM

## 2019-07-01 LAB — CBC WITH DIFFERENTIAL/PLATELET
Abs Immature Granulocytes: 0.01 10*3/uL (ref 0.00–0.07)
Basophils Absolute: 0 10*3/uL (ref 0.0–0.1)
Basophils Relative: 0 %
Eosinophils Absolute: 0 10*3/uL (ref 0.0–0.5)
Eosinophils Relative: 0 %
HCT: 44 % (ref 39.0–52.0)
Hemoglobin: 15.2 g/dL (ref 13.0–17.0)
Immature Granulocytes: 0 %
Lymphocytes Relative: 12 %
Lymphs Abs: 0.6 10*3/uL — ABNORMAL LOW (ref 0.7–4.0)
MCH: 30.4 pg (ref 26.0–34.0)
MCHC: 34.5 g/dL (ref 30.0–36.0)
MCV: 88 fL (ref 80.0–100.0)
Monocytes Absolute: 0.6 10*3/uL (ref 0.1–1.0)
Monocytes Relative: 11 %
Neutro Abs: 4.1 10*3/uL (ref 1.7–7.7)
Neutrophils Relative %: 77 %
Platelets: 155 10*3/uL (ref 150–400)
RBC: 5 MIL/uL (ref 4.22–5.81)
RDW: 13.4 % (ref 11.5–15.5)
WBC: 5.4 10*3/uL (ref 4.0–10.5)
nRBC: 0 % (ref 0.0–0.2)

## 2019-07-01 LAB — URINALYSIS, ROUTINE W REFLEX MICROSCOPIC
Bacteria, UA: NONE SEEN
Bilirubin Urine: NEGATIVE
Glucose, UA: NEGATIVE mg/dL
Hgb urine dipstick: NEGATIVE
Ketones, ur: 80 mg/dL — AB
Leukocytes,Ua: NEGATIVE
Nitrite: NEGATIVE
Protein, ur: 30 mg/dL — AB
Specific Gravity, Urine: 1.032 — ABNORMAL HIGH (ref 1.005–1.030)
Squamous Epithelial / HPF: NONE SEEN (ref 0–5)
pH: 6 (ref 5.0–8.0)

## 2019-07-01 LAB — COMPREHENSIVE METABOLIC PANEL
ALT: 44 U/L (ref 0–44)
AST: 33 U/L (ref 15–41)
Albumin: 4.3 g/dL (ref 3.5–5.0)
Alkaline Phosphatase: 43 U/L (ref 38–126)
Anion gap: 13 (ref 5–15)
BUN: 16 mg/dL (ref 6–20)
CO2: 24 mmol/L (ref 22–32)
Calcium: 9.5 mg/dL (ref 8.9–10.3)
Chloride: 107 mmol/L (ref 98–111)
Creatinine, Ser: 0.73 mg/dL (ref 0.61–1.24)
GFR calc Af Amer: >60 mL/min (ref >60)
GFR calc non Af Amer: >60 mL/min (ref >60)
Glucose, Bld: 99 mg/dL (ref 70–99)
Potassium: 3.9 mmol/L (ref 3.5–5.1)
Sodium: 144 mmol/L (ref 135–145)
Total Bilirubin: 1.1 mg/dL (ref 0.3–1.2)
Total Protein: 8 g/dL (ref 6.5–8.1)

## 2019-07-01 LAB — SARS CORONAVIRUS 2 BY RT PCR (HOSPITAL ORDER, PERFORMED IN ~~LOC~~ HOSPITAL LAB): SARS Coronavirus 2: NEGATIVE

## 2019-07-01 MED ORDER — TRAZODONE HCL 50 MG PO TABS
100.0000 mg | ORAL_TABLET | Freq: Every day | ORAL | Status: DC
  Administered 2019-07-02 – 2019-07-17 (×14): 100 mg via ORAL
  Filled 2019-07-01 (×3): qty 2
  Filled 2019-07-01: qty 1
  Filled 2019-07-01 (×9): qty 2
  Filled 2019-07-01: qty 1
  Filled 2019-07-01: qty 2

## 2019-07-01 MED ORDER — FLUOXETINE HCL 20 MG PO CAPS
40.0000 mg | ORAL_CAPSULE | Freq: Every day | ORAL | Status: DC
  Administered 2019-07-03: 11:00:00 40 mg via ORAL
  Administered 2019-07-05: 20 mg via ORAL
  Administered 2019-07-07 – 2019-07-13 (×5): 40 mg via ORAL
  Filled 2019-07-01 (×13): qty 2

## 2019-07-01 MED ORDER — VALBENAZINE TOSYLATE 80 MG PO CAPS
80.0000 mg | ORAL_CAPSULE | Freq: Every day | ORAL | Status: DC
  Administered 2019-07-02: 80 mg via ORAL
  Filled 2019-07-01: qty 1

## 2019-07-01 MED ORDER — OLANZAPINE 10 MG PO TABS
10.0000 mg | ORAL_TABLET | Freq: Every day | ORAL | Status: DC
  Administered 2019-07-02 – 2019-07-17 (×14): 10 mg via ORAL
  Filled 2019-07-01 (×18): qty 1

## 2019-07-01 MED ORDER — DIPHENHYDRAMINE HCL 50 MG/ML IJ SOLN
50.0000 mg | Freq: Once | INTRAMUSCULAR | Status: AC
  Administered 2019-07-01: 17:00:00 50 mg via INTRAVENOUS
  Filled 2019-07-01: qty 1

## 2019-07-01 MED ORDER — PANTOPRAZOLE SODIUM 40 MG PO TBEC
40.0000 mg | DELAYED_RELEASE_TABLET | Freq: Every day | ORAL | Status: DC
  Administered 2019-07-03 – 2019-07-17 (×8): 40 mg via ORAL
  Filled 2019-07-01 (×12): qty 1

## 2019-07-01 MED ORDER — SODIUM CHLORIDE 0.9 % IV BOLUS
1000.0000 mL | Freq: Once | INTRAVENOUS | Status: AC
  Administered 2019-07-01: 20:00:00 1000 mL via INTRAVENOUS

## 2019-07-01 MED ORDER — ALUM & MAG HYDROXIDE-SIMETH 200-200-20 MG/5ML PO SUSP: 30.0000 mL | Freq: Four times a day (QID) | ORAL | Status: DC | PRN

## 2019-07-01 MED ORDER — LORAZEPAM 2 MG/ML IJ SOLN
0.5000 mg | Freq: Once | INTRAMUSCULAR | Status: AC
  Administered 2019-07-01: 17:00:00 0.5 mg via INTRAVENOUS
  Filled 2019-07-01: qty 1

## 2019-07-01 MED ORDER — LORAZEPAM 2 MG PO TABS
2.0000 mg | ORAL_TABLET | Freq: Four times a day (QID) | ORAL | Status: AC
  Administered 2019-07-01 – 2019-07-02 (×4): 2 mg via ORAL
  Filled 2019-07-01 (×4): qty 1

## 2019-07-01 MED ORDER — ACETAMINOPHEN 500 MG PO TABS
1000.0000 mg | ORAL_TABLET | Freq: Three times a day (TID) | ORAL | Status: DC | PRN
  Administered 2019-07-07 – 2019-07-13 (×3): 1000 mg via ORAL
  Filled 2019-07-01 (×4): qty 2

## 2019-07-01 MED ORDER — VITAMIN B-12 1000 MCG PO TABS
1000.0000 ug | ORAL_TABLET | Freq: Every day | ORAL | Status: DC
  Administered 2019-07-07 – 2019-07-17 (×8): 1000 ug via ORAL
  Filled 2019-07-01 (×13): qty 1

## 2019-07-01 MED ORDER — DICYCLOMINE HCL 20 MG PO TABS
20.0000 mg | ORAL_TABLET | Freq: Two times a day (BID) | ORAL | Status: DC | PRN
  Administered 2019-07-02: 22:00:00 20 mg via ORAL
  Filled 2019-07-01 (×3): qty 1

## 2019-07-01 NOTE — ED Provider Notes (Signed)
[facility] Emergency Department Provider Note  ____________________________________________   First MD Initiated Contact with Patient 07/01/19 1629     (approximate)  I have reviewed the triage vital signs and the nursing notes.   HISTORY  Chief Complaint Dysphagia, Shortness of Breath, and Tremors    HPI Troy Fox is a 48 y.o. male with bipolar 1, catatonia schizophrenia, tardive dyskinesia who presents with tremors.  Patient had tremors in the past 3 weeks has had trouble swallowing food.  Patient had a upper GI study that showed normal esophagus with moderate size paraesophageal hernia.  Patient is father noted that he has been having increased drooling and shaking episodes.  Patient was seen here on 10/5 and got Benadryl and Ativan and was better but but then has progressively getting worse over the past 3 weeks where he is not eating very well.  He has been having to grind of his food and do shakes.  She he is not sure if he is been having some shortness of breath.  He also mentioned that he is been having some back pain is been going on for months.  He has had some previous falls none recently.  No urinary incontinence.  No leg weakness.  Patient still is able to walk but has a shuffling gait that is baseline for him.          Past Medical History:  Diagnosis Date   Bipolar 1 disorder (HCC)    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HCC)    GERD (gastroesophageal reflux disease)    Nonverbal    Schizoaffective disorder (HCC)    Tardive dyskinesia     Patient Active Problem List   Diagnosis Date Noted   Abdominal pain, epigastric 05/15/2019   Diarrhea 05/15/2019   Hiatal hernia 05/15/2019   Schizoaffective disorder, depressive type (HCC) 01/29/2019   Catatonia 01/29/2019   Mild malnutrition (HCC) 01/29/2019   Schizoaffective disorder, bipolar type (HCC) 01/03/2019   Acute neuroleptic-induced akathisia 01/03/2019    Past Surgical  History:  Procedure Laterality Date   BIOPSY  06/15/2019   Procedure: BIOPSY;  Surgeon: Malissa Hippo, MD;  [facility];  Service: Endoscopy;;  gastric   ESOPHAGOGASTRODUODENOSCOPY (EGD) WITH PROPOFOL N/A 06/15/2019   Procedure: ESOPHAGOGASTRODUODENOSCOPY (EGD) WITH PROPOFOL;  Surgeon: Malissa Hippo, MD;  Location: [facility];  Service: Endoscopy;  Laterality: N/A;  7:30    Prior to Admission medications   Medication Sig Start Date End Date Taking? Authorizing Provider  acetaminophen (TYLENOL) 500 MG tablet Take 1,000 mg by mouth every 8 (eight) hours as needed for moderate pain.    [provider]  alum & mag hydroxide-simeth (MYLANTA) 200-200-20 MG/5ML suspension Take 30 mLs by mouth every 6 (six) hours as needed for indigestion or heartburn.    [provider]  cyanocobalamin 1000 MCG tablet Take 1 tablet (1,000 mcg total) by mouth daily. 03/05/19   Clapacs, Jackquline Denmark, MD  dicyclomine (BENTYL) 20 MG tablet Take 1 tablet (20 mg total) by mouth 2 (two) times daily as needed for spasms. 05/15/19   Arnaldo Natal, NP  FLUoxetine (PROZAC) 40 MG capsule TAKE 1 CAPSULE BY MOUTH DAILY. 04/09/19   Clapacs, Jackquline Denmark, MD  OLANZapine (ZYPREXA) 10 MG tablet Take 10 mg by mouth at bedtime.  05/07/19   [provider]  omeprazole (PRILOSEC) 40 MG capsule Take 40 mg by mouth daily.    [provider]  traZODone (DESYREL) 100 MG tablet Take 1 tablet (  100 mg total) by mouth at bedtime. 03/05/19   Clapacs, Jackquline Denmark, MD  Valbenazine Tosylate [facility]) 80 MG CAPS Take 30 mg by mouth daily. Patient taking differently: Take 80 mg by mouth daily.  03/26/19   Pucilowski, Roosvelt Maser, MD    Allergies Aripiprazole  Family History  Adopted: Yes  Family history unknown: Yes    Social History Social History   Tobacco Use   Smoking status: Never Smoker   Smokeless tobacco: Never Used  Substance Use Topics   Alcohol use: Never    Frequency: Never    Drug use: Never      Review of Systems Constitutional: No fever/chills, not eating Eyes: No visual changes. ENT: No sore throat.  Positive drooling Cardiovascular: Denies chest pain. Respiratory: Denies shortness of breath. Gastrointestinal: No abdominal pain.  No nausea, no vomiting.  No diarrhea.  No constipation. Genitourinary: Negative for dysuria. Musculoskeletal: Positive back pain. Skin: Negative for rash. Neurological: Negative for headaches, focal weakness or numbness. All other ROS negative ____________________________________________   PHYSICAL EXAM:  VITAL SIGNS: Blood pressure (!) 133/93, pulse (!) 104, resp. rate (!) 21, height 5\' 5"  (1.651 m), weight 65.8 kg, SpO2 97 %.   Constitutional: Alert and looking around.  Does have some drooling. Eyes: Conjunctivae are normal. EOMI. Head: Atraumatic. Nose: No congestion/rhinnorhea. Mouth/Throat: Mucous membranes are moist.   Neck: No stridor. Trachea Midline. FROM Cardiovascular: Normal rate, regular rhythm. Grossly normal heart sounds.  Good peripheral circulation. Respiratory: Normal respiratory effort.  No retractions. Lungs CTAB. Gastrointestinal: Soft and nontender. No distention. No abdominal bruits.  Musculoskeletal: No lower extremity tenderness nor edema.  No joint effusions.  5 out of 5 strength in bilateral lower extremity.  No sensation changes.  Some lower back tenderness that is  minimally midline but more paraspinal in nature.   Neurologic: Moves all extremities well.  No gross deficits. Skin:  Skin is warm, dry and intact. No rash noted. Psychiatric: Slight tremor in his upper extremities.  Withdrawn. GU: Deferred   ____________________________________________   LABS (all labs ordered are listed, but only abnormal results are displayed)  Labs Reviewed  CBC WITH DIFFERENTIAL/PLATELET - Abnormal; Notable for the following components:      Result Value   Lymphs Abs 0.6 (*)    All other components  within normal limits  COMPREHENSIVE METABOLIC PANEL   ____________________________________________   ED ECG REPORT I, , the attending physician, personally viewed and interpreted this ECG.  EKG is sinus tachycardia rate of 103, no ST ovation, no T wave inversion, normal intervals. ____________________________________________  RADIOLOGY Concha Se, personally viewed and evaluated these images (plain radiographs) as part of my medical decision making, as well as reviewing the written report by the radiologist.  ED MD interpretation: X-rays are unremarkable.  Official radiology report(s): Dg Chest 2 View  Result Date: 07/01/2019 CLINICAL DATA:  Acute shortness of breath EXAM: CHEST - 2 VIEW COMPARISON:  01/24/2019 chest radiograph FINDINGS: The cardiomediastinal silhouette is unremarkable. Mild peribronchial thickening again noted. There is no evidence of focal airspace disease, pulmonary edema, suspicious pulmonary nodule/mass, pleural effusion, or pneumothorax. No acute bony abnormalities are identified. IMPRESSION: 1. No evidence of acute cardiopulmonary disease. 2. Mild chronic peribronchial thickening. Electronically Signed   By: 01/26/2019 M.D.   On: 07/01/2019 15:38   Dg Thoracic Spine 2 View  Result Date: 07/01/2019 CLINICAL DATA:  Acute UPPER back pain. EXAM: THORACIC SPINE 2 VIEWS COMPARISON:  None. FINDINGS: There is no  evidence of acute fracture or subluxation. Mild multilevel degenerative disc disease noted. No suspicious focal bony lesions are present. IMPRESSION: No evidence of acute bony abnormality. Electronically Signed   By: Margarette Canada M.D.   On: 07/01/2019 18:00   Dg Lumbar Spine 2-3 Views  Result Date: 07/01/2019 CLINICAL DATA:  48 year old male with back pain. EXAM: LUMBAR SPINE - 2-3 VIEW COMPARISON:  Lumbar spine radiograph dated 01/24/2019 FINDINGS: There is no acute fracture or subluxation of the lumbar spine. Mild chronic appearing compression  fracture of the superior endplate of L4. There is grade 1 L5-S1 retrolisthesis. The bones are mildly osteopenic. The visualized posterior elements are intact. The soft tissues are unremarkable. Oral contrast noted in portions of the colon. IMPRESSION: No acute/traumatic lumbar spine pathology. Electronically Signed   By: Anner Crete M.D.   On: 07/01/2019 18:02    ____________________________________________   PROCEDURES  Procedure(s) performed (including Critical Care):  Procedures   ____________________________________________   INITIAL IMPRESSION / ASSESSMENT AND PLAN / ED COURSE  Freddrick Gladson was evaluated in Emergency Department on 07/01/2019 for the symptoms described in the history of present illness. He was evaluated in the context of the global COVID-19 pandemic, which necessitated consideration that the patient might be at risk for infection with the SARS-CoV-2 virus that causes COVID-19. Institutional protocols and algorithms that pertain to the evaluation of patients at risk for COVID-19 are in a state of rapid change based on information released by regulatory bodies including the CDC and federal and state organizations. These policies and algorithms were followed during the patient's care in the ED.    Patient is a 48 year old with significant psychiatric history who comes in with not eating, drooling, shaking.  I suspect that most of this comes on his psychiatric illness and he started to go down his right catatonia again.  Will discuss with the psychiatric team for admission.  Will get labs to evaluate for Electra abnormalities, AKI.  Patient does not elicit any shortness of breath will get chest x-ray given the concern with the father for maybe some shortness of breath at home.  He has no chest pain suggest ACS.  His back pain seems to be chronic in nature.  We will do some x-rays to evaluate.  No evidence of cauda equina.  Patient reports void residual that was normal.  Will  get UA to evaluate for UTI.  No abdominal pain to suggest AAA.  6:10 PM patient has signs of slight dehydration with 80 ketones in his urine but without evidence of UTI.  Will give 1 L of fluid.  Patient kidney function is normal.  White count is normal.  X-rays of his back are normal as well.  Patient has a normal post void residual.  There is report of some slight redness on his upper back.  We will continue to monitor does not appear to be cellulitic.  Patient's tremor and drooling has receded after the medications.  With the psychiatric team recommend admission for psychiatric illness.  Will start on Ativan 2 mg p.o. every 4-6 hours to help with catatonia.          ____________________________________________   FINAL CLINICAL IMPRESSION(S) / ED DIAGNOSES   Final diagnoses:  Shaking  Drooling  Psychiatric illness      MEDICATIONS GIVEN DURING THIS VISIT:  Medications  sodium chloride 0.9 % bolus 1,000 mL (has no administration in time range)  diphenhydrAMINE (BENADRYL) injection 50 mg (50 mg Intravenous Given 07/01/19 1718)  LORazepam (ATIVAN) injection 0.5 mg (0.5 mg Intravenous Given 07/01/19 1713)     ED Discharge Orders    None       Note:  This document was prepared using Dragon voice recognition software and may include unintentional dictation errors.   Concha SeFunke, Mildred Tuccillo E, MD 07/01/19 1950

## 2019-07-01 NOTE — ED Notes (Signed)
Pt ringing out for water at this time. This RN helped pt with drink. Pt drank with no distress at this time.

## 2019-07-01 NOTE — BH Assessment (Signed)
Assessment Note  Troy Fox is an 48 y.o. male. Troy Fox arrived to the ED by way of transportation by his father Troy Fox - [phone]).   Father reports that he brought Troy Fox in due to his shaking, back hurting, and short of breath.  He stated that he could not swallow his saliva. He shared that his Tardive Dyskinesia is getting bad and he could not care for him anymore. He is hoping that he can be seen again by Dr. Toni Amend for ECT.  He reports symptoms of depression and does not want to eat, he has lost about 10 pounds.  He has no interest in anything.  He is "just not functioning". He reports that he is anxious. He is restless and will pace, and he falls when he is pacing.  He has fallen 4 times.  He has made statements, that "Troy Fox is trying to get him".  He denied suicidal or homicidal ideation or intent.  No history of alcohol or drugs reported.  No new stressors reported.  Father states that "He hates himself because he is sick and he wants everyone to fix it, and we are not fixing it".    Diagnosis:   Past Medical History:  Past Medical History:  Diagnosis Date  . Bipolar 1 disorder (HCC)   . 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 GERD (gastroesophageal reflux disease)   . Nonverbal   . Schizoaffective disorder (HCC)   . Tardive dyskinesia     Past Surgical History:  Procedure Laterality Date  . BIOPSY  06/15/2019   Procedure: BIOPSY;  Surgeon: Malissa Hippo, MD;  [facility];  Service: Endoscopy;;  gastric  . ESOPHAGOGASTRODUODENOSCOPY (EGD) WITH PROPOFOL N/A 06/15/2019   Procedure: ESOPHAGOGASTRODUODENOSCOPY (EGD) WITH PROPOFOL;  Surgeon: Malissa Hippo, MD;  [facility];  Service: Endoscopy;  Laterality: N/A;  7:30    Family History:  Family History  Adopted: Yes  Family history unknown: Yes    Social History:  reports that he has never smoked. He has never used smokeless tobacco. He reports that he does not drink alcohol or use  drugs.  Additional Social History:  Alcohol / Drug Use History of alcohol / drug use?: No history of alcohol / drug abuse  CIWA: CIWA-Ar BP: 131/85 Pulse Rate: (!) 102 COWS:    Allergies:  Allergies  Allergen Reactions  . Aripiprazole Other (See Comments)    Causes gambling addiction  *abilify     Home Medications: (Not in a hospital admission)   OB/GYN Status:  No LMP for male patient.  General Assessment Data Location of Assessment: [facility] ED TTS Assessment: In system Is this a Tele or Face-to-Face Assessment?: Face-to-Face Is this an Initial Assessment or a Re-assessment for this encounter?: Initial Assessment Patient Accompanied by:: Parent(Troy Fox [phone] - Father) Language Other than English: No Living Arrangements: Other (Comment)(Private residence) What gender do you identify as?: Male Marital status: Single Living Arrangements: Parent Can pt return to current living arrangement?: Yes Admission Status: Voluntary Is patient capable of signing voluntary admission?: Yes Referral Source: Self/Family/Friend Insurance type: Medicare/Medicaid  Medical Screening Exam [facility] Walk-in ONLY) Medical Exam completed: Yes  Crisis Care Plan Living Arrangements: Parent Legal Guardian: Other:(Self) Name of Psychiatrist: None Name of Therapist: None  Education Status Is patient currently in school?: No Is the patient employed, unemployed or receiving disability?: Receiving disability income  Risk to self with the past 6 months Suicidal Ideation: No Has patient been a risk  to self within the past 6 months prior to admission? : No Suicidal Intent: No Has patient had any suicidal intent within the past 6 months prior to admission? : No Is patient at risk for suicide?: No Suicidal Plan?: No Has patient had any suicidal plan within the past 6 months prior to admission? : No Access to Means: No What has been your use of drugs/alcohol within the last 12 months?:  Denied use of Alcohol or drugs Previous Attempts/Gestures: No How many times?: 0 Other Self Harm Risks: Denied Triggers for Past Attempts: None known Intentional Self Injurious Behavior: None Family Suicide History: No Recent stressful life event(s): (None reported) Persecutory voices/beliefs?: No Depression: Yes Depression Symptoms: Isolating, Loss of interest in usual pleasures Substance abuse history and/or treatment for substance abuse?: No Suicide prevention information given to non-admitted patients: Not applicable  Risk to Others within the past 6 months Homicidal Ideation: No Does patient have any lifetime risk of violence toward others beyond the six months prior to admission? : No Thoughts of Harm to Others: No Current Homicidal Intent: No Current Homicidal Plan: No Access to Homicidal Means: No Identified Victim: None identified History of harm to others?: No Assessment of Violence: None Noted Does patient have access to weapons?: No Criminal Charges Pending?: No Does patient have a court date: No Is patient on probation?: No  Psychosis Hallucinations: Auditory Delusions: None noted  Mental Status Report Appearance/Hygiene: In hospital gown Eye Contact: Poor Motor Activity: Restlessness Speech: Slow, Slurred Level of Consciousness: Quiet/awake Mood: Depressed Affect: Flat Anxiety Level: Minimal Thought Processes: Unable to Assess Judgement: Unable to Assess Orientation: Person, Place, Situation Obsessive Compulsive Thoughts/Behaviors: None  Cognitive Functioning Concentration: Fair Memory: Recent Intact Is patient IDD: No Insight: Fair Impulse Control: Fair Appetite: Poor Have you had any weight changes? : Loss Amount of the weight change? (lbs): 10 lbs Sleep: No Change Vegetative Symptoms: Staying in bed  ADLScreening [facility]) Patient's cognitive ability adequate to safely complete daily activities?: No(Cannot dress  himself) Patient able to express need for assistance with ADLs?: Yes Independently performs ADLs?: No  Prior Inpatient Therapy Prior Inpatient Therapy: Yes Prior Therapy Dates: 2017 and later (Multiple Hospitalizations in [place]) Prior Therapy Facilty/Provider(s): In [place] and [facility] Reason for Treatment: Catatonia  Prior Outpatient Therapy Prior Outpatient Therapy: No Does patient have an ACCT team?: No Does patient have Intensive In-House Services?  : No Does patient have [facility] services? : No Does patient have P4CC services?: No  ADL Screening (condition at time of admission) Patient's cognitive ability adequate to safely complete daily activities?: No(Cannot dress himself) Is the patient deaf or have difficulty hearing?: No Does the patient have difficulty seeing, even when wearing glasses/contacts?: No Does the patient have difficulty concentrating, remembering, or making decisions?: Yes Patient able to express need for assistance with ADLs?: Yes Does the patient have difficulty dressing or bathing?: Yes Independently performs ADLs?: No Communication: Independent Dressing (OT): Needs assistance Is this a change from baseline?: Pre-admission baseline Grooming: Needs assistance Is this a change from baseline?: Pre-admission baseline Feeding: Needs assistance(Only has soft foods, cannot eat meats) Is this a change from baseline?: Pre-admission baseline Bathing: Independent Toileting: Independent In/Out Bed: Needs assistance Is this a change from baseline?: Pre-admission baseline Walks in Home: Independent with device (comment) Does the patient have difficulty walking or climbing stairs?: Yes Weakness of Legs: None(Trouble walking due to Tardive Dyskinesia) Weakness of Arms/Hands: None  Home Assistive Devices/Equipment Home Assistive Devices/Equipment: Walker (specify type), Grab bars  in shower(2 wheel walker)    Abuse/Neglect Assessment (Assessment to be  complete while patient is alone) Abuse/Neglect Assessment Can Be Completed: (None reported) Physical Abuse: Denies Verbal Abuse: Denies Sexual Abuse: Denies Exploitation of patient/patient's resources: Denies                Disposition:  Disposition Initial Assessment Completed for this Encounter: Yes  On Site Evaluation by:   Reviewed with Physician:    Justice DeedsKeisha Montez Cuda 07/01/2019 9:43 PM

## 2019-07-01 NOTE — ED Notes (Signed)
Updated father informed him that patient would be seen by telepsych tonight.

## 2019-07-01 NOTE — ED Triage Notes (Signed)
Pt father reports that the patient has become aggressive and he can no longer take care of him.

## 2019-07-01 NOTE — ED Triage Notes (Signed)
Pt reports has tremors and over the past 3 weeks he has had trouble swallowing his food, has felt short of breath and his tremors have gotten worse.

## 2019-07-01 NOTE — ED Triage Notes (Signed)
Pt father reports he had an upper GI due to the difficulty swallowing on Friday but no results yet.

## 2019-07-02 MED ORDER — MORPHINE SULFATE (PF) 4 MG/ML IV SOLN
4.0000 mg | Freq: Once | INTRAVENOUS | Status: AC
  Administered 2019-07-02: 14:00:00 2 mg via INTRAVENOUS
  Filled 2019-07-02: qty 1

## 2019-07-02 MED ORDER — LORAZEPAM 2 MG/ML IJ SOLN
1.0000 mg | Freq: Once | INTRAMUSCULAR | Status: AC
  Administered 2019-07-02: 15:00:00 1 mg via INTRAVENOUS
  Filled 2019-07-02: qty 1

## 2019-07-02 MED ORDER — VALBENAZINE TOSYLATE 80 MG PO CAPS
80.0000 mg | ORAL_CAPSULE | Freq: Every day | ORAL | Status: DC
  Administered 2019-07-03 – 2019-07-11 (×6): 80 mg via ORAL
  Filled 2019-07-02 (×9): qty 1

## 2019-07-02 MED ORDER — FAMOTIDINE IN NACL 20-0.9 MG/50ML-% IV SOLN
20.0000 mg | Freq: Once | INTRAVENOUS | Status: AC
  Administered 2019-07-02: 21:00:00 20 mg via INTRAVENOUS
  Filled 2019-07-02: qty 50

## 2019-07-02 NOTE — ED Notes (Signed)
Father called back and states he spoke to Dr Dereck Leep at [facility] and pt had a barium swallow last week that showed a para esophageal hernia that needs surgery. Office # [phone] MD # [phone] EDP notified of information

## 2019-07-02 NOTE — ED Notes (Signed)
Assisted up to chair  

## 2019-07-02 NOTE — ED Notes (Signed)
VOL, accepted to [facility] on 10.20.20.

## 2019-07-02 NOTE — ED Notes (Signed)
Previous RN reported pt was able to take medications with water. When attempting to administer ativan pt did have some difficulty swallowing and coughed a couple time after taking medication.

## 2019-07-02 NOTE — ED Notes (Signed)
Attempted to wake pt to administer medications at this time. Pt continues to sleep with verbal stimulation. Medication will be held at this time to prevent any potential aspiration or choking risks.

## 2019-07-02 NOTE — Evaluation (Addendum)
Clinical/Bedside Swallow Evaluation Patient Details  Name: Troy Fox MRN: [id number] Date of Birth: 06-Apr-1971  Today's Date: 07/02/2019 Time: SLP Start Time (ACUTE ONLY): 2130 SLP Stop Time (ACUTE ONLY): 1455 SLP Time Calculation (min) (ACUTE ONLY): 60 min  Past Medical History:  Past Medical History:  Diagnosis Date  . Bipolar 1 disorder ([place])   . 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 GERD (gastroesophageal reflux disease)   . Nonverbal   . Schizoaffective disorder ([place])   . Tardive dyskinesia    Past Surgical History:  Past Surgical History:  Procedure Laterality Date  . BIOPSY  06/15/2019   Procedure: BIOPSY;  Surgeon: Rogene Houston, MD;  Location: [facility];  Service: Endoscopy;;  gastric  . ESOPHAGOGASTRODUODENOSCOPY (EGD) WITH PROPOFOL N/A 06/15/2019   Procedure: ESOPHAGOGASTRODUODENOSCOPY (EGD) WITH PROPOFOL;  Surgeon: Rogene Houston, MD;  Location: [facility];  Service: Endoscopy;  Laterality: N/A;  7:30   HPI:  Pt is a 48 y.o. male with bipolar 1 dis, catatonia schizophrenia, tardive dyskinesia, GERD, and a MOD hiatal hernia, back pain who presents to the ED with tremors, worsening tardive dyskinesia again per report.  Patient had increased tremors in the past 3 weeks, even some trouble chewing and swallowing food.  Patient had a upper GI study that showed normal esophagus with MODERATE size paraesophageal hernia.  Patient is father noted that he has been having increased drooling and shaking episodes in the past 3 weeks even unable to swallow his own saliva.  Patient was seen here on 10/5 and got Benadryl and Ativan and was better but but then has progressively getting worse over the past 3 weeks and not eating very well.  He has been having to grind of his food and do shakes.  Pt is able to walk w/ a shuffling gait; recent falls.  Per chart notes, pt's Father stated that pt's "Tardive Dyskinesia is getting bad and he could not care for him anymore". He is hoping that  he can be seen again by Dr. Weber Cooks for ECT.  He reports pt having symptoms of depression and not wanting to eat, he has lost about 10 pounds(noted dx of Mild Malnutrition in [place] baseline).     Assessment / Plan / Recommendation Clinical Impression  Pt appears to present w/ Moderate oral phase dysphagia at this time - suspect impacted by declined mental status and Tardive Dyskinesia; medications. Any oral phase dyspahgia increases risk for pharyngeal phase deficits to occur, even aspiration/choking. Oral phase deficits also increase risk for inadequate nutrition/hydration(pt has a Baseline of MILD Malnutrition per H/P and has recently lost weight per report).  During this assessment today, pt consumed trials of thin liquids, then purees w/ no immediate, overt clinical s/s of aspiration; no decline in O2 sats(97%) or respiratory presentation(low 20s). Pt's vocal quality remained unchanged, clear during few mumbled responses. However, pt exibited Moderate Oral phase deficits c/b decreased awareness of bolus during the prep(Minimal mouth opening for bolus acceptance) and bolus management phase, prolonged A-P transit stage, and slight oral holding x2 noted. ALL motor movements were slow, min tremorous activity noted overall around the Head/Neck area, and UEs(unsure if related to the tardive dyskinesia). OM exam was difficult to complete d/t pt's poor follow through w/ oral tasks. Native dentition noted. Pt required full feeding assistance. Pt did not want much po but accepted the few trials of each consistency from SLP w/ continued encouragement. No reports of N/V w/ po's, but pt did endorse his stomach "hurt"  along w/ his back -- NSG aware and giving pain medication. D/t pt's current presentation of both declined mental status w/ Tardive Dyskinesia, oral phase dysphagia, and potential impact from Esophageal phase deficits/dysmotility d/t the Mod. Hiatal Hernia, recommend a dysphagia level 1 (Puree foods) w/  gravies to moisten, Thin liquids. Recommend aspiration precautions; STRICT REFLUX precautions; Pills in Puree for safer, easier swallowing - CRUSHED as necessary by NSG.  D/t pt's baseline of Malnutrition per the H/P -- he needs a Dietician consult for assessment and potential support of DRINK supplements(ensure) -- he may not be a robust eater(of foods at meals) d/t his illness and mental status.   SLP Visit Diagnosis: Dysphagia, oral phase (R13.11)(Psychiatric dxs; Hiatal Hernia)    Aspiration Risk  Risk for inadequate nutrition/hydration;Mild aspiration risk(reduced following precautions)    Diet Recommendation  Dysphagia level 1 (puree) w/ Thin liquids. STRICT REFLUX precautions; general aspiration precautions; feeding support/assistance d/t overall needs/weakness/tremors currently  Medication Administration: Crushed with puree(for safer swallowing/clearing of the Esophagus)    Other  Recommendations Recommended Consults: Consider GI evaluation;Consider esophageal assessment(following; Dietician consult for support) Oral Care Recommendations: Oral care BID;Staff/trained caregiver to provide oral care Other Recommendations: (n/a)   Follow up Recommendations (follow up recommended)      Frequency and Duration min 2x/week  1 week       Prognosis Prognosis for Safe Diet Advancement: Fair Barriers to Reach Goals: Cognitive deficits;Time post onset;Severity of deficits(Mental Health status)      Swallow Study   General Date of Onset: 07/01/19 HPI: Pt is a 48 y.o. male with bipolar 1 dis, catatonia schizophrenia, tardive dyskinesia, GERD, and a MOD hiatal hernia, back pain who presents to the ED with tremors, worsening tardive dyskinesia again per report.  Patient had increased tremors in the past 3 weeks, even some trouble chewing and swallowing food.  Patient had a upper GI study that showed normal esophagus with MODERATE size paraesophageal hernia.  Patient is father noted that he has  been having increased drooling and shaking episodes in the past 3 weeks even unable to swallow his own saliva.  Patient was seen here on 10/5 and got Benadryl and Ativan and was better but but then has progressively getting worse over the past 3 weeks and not eating very well.  He has been having to grind of his food and do shakes.  Pt is able to walk w/ a shuffling gait; recent falls.  Per chart notes, pt's Father stated that pt's "Tardive Dyskinesia is getting bad and he could not care for him anymore". He is hoping that he can be seen again by Dr. Toni Amendlapacs for ECT.  He reports pt having symptoms of depression and not wanting to eat, he has lost about 10 pounds(noted dx of Mild Malnutrition in PMH baseline).   Type of Study: Bedside Swallow Evaluation Previous Swallow Assessment: none reported Diet Prior to this Study: Regular;Thin liquids Temperature Spikes Noted: (wbc 5.4; no recent temp noted) Respiratory Status: Room air History of Recent Intubation: No Behavior/Cognition: Alert;Cooperative;Pleasant mood;Distractible;Requires cueing(tremorous) Oral Cavity Assessment: Dry(but difficult to assess d/t pt's reduced participation) Oral Care Completed by SLP: Yes(attempted) Oral Cavity - Dentition: Adequate natural dentition Vision: (could not determine - he attempted) Self-Feeding Abilities: Able to feed self;Needs assist;Needs set up;Total assist Patient Positioning: Upright in bed(needed positioning) Baseline Vocal Quality: Normal;Low vocal intensity(min) Volitional Cough: Cognitively unable to elicit Volitional Swallow: Unable to elicit    Oral/Motor/Sensory Function Overall Oral Motor/Sensory Function: Generalized oral weakness(reduced ROM; min  tremors overall) Facial Symmetry: Within Functional Limits Lingual Symmetry: Within Functional Limits(briefly noted/assessed)   Ice Chips Ice chips: Impaired Presentation: Spoon(fed; 3 trials) Oral Phase Impairments: Poor awareness of bolus;Reduced  lingual movement/coordination Oral Phase Functional Implications: Prolonged oral transit Pharyngeal Phase Impairments: (no overt clinical s/s )   Thin Liquid Thin Liquid: Impaired Presentation: Straw(assisted fully; 10 trials ) Oral Phase Impairments: Reduced lingual movement/coordination;Poor awareness of bolus Oral Phase Functional Implications: Prolonged oral transit Pharyngeal  Phase Impairments: (no overt clinical s/s ) Other Comments: few multiple sips via straw    Nectar Thick Nectar Thick Liquid: Not tested   Honey Thick Honey Thick Liquid: Not tested   Puree Puree: Impaired Presentation: Spoon(fed; 10 trials) Oral Phase Impairments: Reduced lingual movement/coordination;Poor awareness of bolus Oral Phase Functional Implications: Prolonged oral transit(min-mod) Pharyngeal Phase Impairments: (no overt clinical s/s)   Solid     Solid: Not tested Other Comments: d/t oral phase presentation w/ purees, liquids        Jerilynn Som, MS, CCC-SLP Freyja Govea 07/02/2019,3:26 PM

## 2019-07-02 NOTE — ED Notes (Signed)
PT at bedside at this time 

## 2019-07-02 NOTE — ED Notes (Signed)
Pt tried to get out of bed. We got pt back in bed and put pt on bed pan.

## 2019-07-02 NOTE — ED Notes (Signed)
Speech/ swallow evaluation being completed at this time. Pt sitting up in bed. C/o leg, back, and stomach pain

## 2019-07-02 NOTE — Evaluation (Signed)
Physical Therapy Evaluation Patient Details Name: Troy Fox MRN: [id number] DOB: 1970-12-26 Today's Date: 07/02/2019   History of Present Illness  Per MD H&P: Pt is a 48 y.o. male with bipolar 1, catatonia schizophrenia, tardive dyskinesia who presents with tremors.  Patient had tremors in the past 3 weeks has had trouble swallowing food.  Patient had a upper GI study that showed normal esophagus with moderate size paraesophageal hernia.  Patient is father noted that he has been having increased drooling and shaking episodes.  Patient was seen here on 10/5 and got Benadryl and Ativan and was better but but then has progressively getting worse over the past 3 weeks where he is not eating very well.  He has been having to grind of his food and do shakes.  She he is not sure if he is been having some shortness of breath.  He also mentioned that he is been having some back pain is been going on for months.  He has had some previous falls none recently.  No urinary incontinence.  No leg weakness.  Patient still is able to walk but has a shuffling gait that is baseline for him.    Clinical Impression  Pt presented with deficits in strength, transfers, mobility, gait, balance, and activity tolerance.  Pt was unable to provide history during the session with decreased expressive ability with only occasional one word answers to questions provided that were difficult to understand.  Pt's HR was in the low 100s at rest and increased to 125 bpm upon coming to sitting.  Nursing contacted and informed with nursing stating ok to attempt amb with pt.  Pt was able to stand without physical assistance and ambulated 20' with a RW with very slow, shuffling steps and min instability that the pt was able to self-correct.  Pt's HR increased to a max of 137 during amb and returned quickly to the low to mid 120s upon sitting.  Pt was unable to provide a PLOF but looking back to recent PT notes from June, 2020 pt at that time  was Ind with bed mobility and was able to amb >400 feet without an AD.  Pt presents with a significant decline in functional mobility compared to that recent baseline and will benefit from PT services in a SNF setting upon discharge to safely address above deficits for decreased caregiver assistance and eventual return to PLOF.      Follow Up Recommendations SNF    Equipment Recommendations  Rolling walker with 5" wheels;Other (comment)(TBD at next venue of care, unsure of what DME pt owns)    Recommendations for Other Services       Precautions / Restrictions Precautions Precautions: Fall Restrictions Weight Bearing Restrictions: No      Mobility  Bed Mobility Overal bed mobility: Needs Assistance Bed Mobility: Supine to Sit;Sit to Supine     Supine to sit: Min assist Sit to supine: Min assist   General bed mobility comments: Min A for BLE and trunk control  Transfers Overall transfer level: Needs assistance Equipment used: Rolling walker (2 wheeled) Transfers: Sit to/from Stand Sit to Stand: Min guard         General transfer comment: Fair eccentric and concentric control during sit to/from stand transfers  Ambulation/Gait Ambulation/Gait assistance: Min assist Gait Distance (Feet): 20 Feet Assistive device: Rolling walker (2 wheeled) Gait Pattern/deviations: Step-through pattern;Festinating;Shuffle;Decreased step length - right;Decreased step length - left Gait velocity: decreased   General Gait Details: Min A to guide  the RW during amb with pt taking very small, shuffling steps with slow cadence and min instability that the pt was able to self-correct  Stairs            Wheelchair Mobility    Modified Rankin (Stroke Patients Only)       Balance Overall balance assessment: Mild deficits observed, not formally tested                                           Pertinent Vitals/Pain Pain Assessment: (Pt unable to rate, no signs  of distress during the session)    Home Living Family/patient expects to be discharged to:: Other (Comment)                 Additional Comments: Per chart review pt accepted to [facility] with planned discharge 07/03/19.    Prior Function           Comments: Unkown, pt unable to provide history with no caregiver availiable to assist.     Hand Dominance        Extremity/Trunk Assessment   Upper Extremity Assessment Upper Extremity Assessment: Generalized weakness    Lower Extremity Assessment Lower Extremity Assessment: Generalized weakness       Communication   Communication: Expressive difficulties  Cognition Arousal/Alertness: Lethargic Behavior During Therapy: Flat affect Overall Cognitive Status: No family/caregiver present to determine baseline cognitive functioning                                 General Comments: Pt with limited ability to communicate verbally with only occaisional one word answers to questions provided      General Comments      Exercises     Assessment/Plan    PT Assessment Patient needs continued PT services  PT Problem List Decreased strength;Decreased activity tolerance;Decreased balance;Decreased mobility;Decreased knowledge of use of DME       PT Treatment Interventions DME instruction;Gait training;Functional mobility training;Therapeutic activities;Therapeutic exercise;Balance training;Patient/family education    PT Goals (Current goals can be found in the Care Plan section)  Acute Rehab PT Goals PT Goal Formulation: Patient unable to participate in goal setting Time For Goal Achievement: 07/15/19 Potential to Achieve Goals: Fair    Frequency Min 2X/week   Barriers to discharge        Co-evaluation               AM-PAC PT "6 Clicks" Mobility  Outcome Measure Help needed turning from your back to your side while in a flat bed without using bedrails?: A Little Help needed moving  from lying on your back to sitting on the side of a flat bed without using bedrails?: A Little Help needed moving to and from a bed to a chair (including a wheelchair)?: A Little Help needed standing up from a chair using your arms (e.g., wheelchair or bedside chair)?: A Little Help needed to walk in hospital room?: A Little Help needed climbing 3-5 steps with a railing? : A Lot 6 Click Score: 17    End of Session Equipment Utilized During Treatment: Gait belt Activity Tolerance: Patient tolerated treatment well Patient left: in bed;with call bell/phone within reach Nurse Communication: Mobility status PT Visit Diagnosis: Unsteadiness on feet (R26.81);Muscle weakness (generalized) (M62.81);Difficulty in walking, not elsewhere classified (R26.2)  Time: 1440-1502 PT Time Calculation (min) (ACUTE ONLY): 22 min   Charges:   PT Evaluation $PT Eval Moderate Complexity: 1 Mod          D. Scott Kaliya Shreiner PT, DPT 07/02/19, 4:15 PM

## 2019-07-02 NOTE — ED Notes (Signed)
Pt called out with call light asking for someone to stay in room with him to talk. Pt denies thoughts of SI or HI. States he just wants someone to talk to

## 2019-07-02 NOTE — ED Notes (Signed)
VOL, pending placement 

## 2019-07-02 NOTE — ED Notes (Signed)
Pt only able to tolerate small bites at a time. Pt eat a couple very small bites of puree chic, green beans, and potatoes. Pt eat 1/4 of magic cup. Pt drank about 8 oz of water, and 4 oz of sweet tea. Pt was unable to tolerate food, became nauseous and had one episode of emesis.

## 2019-07-02 NOTE — ED Notes (Addendum)
Pt continues to ask for someone to sit with him, attempting to get out of bed. States his stomach hurts and he needs to get out of here. Safety measures in place. Pt with a history of falls

## 2019-07-02 NOTE — ED Notes (Signed)
Nurse tech informed this RN that pt calling out again asking for someone to talk to

## 2019-07-02 NOTE — ED Notes (Signed)
Pt having difficulty swallowing

## 2019-07-02 NOTE — ED Notes (Signed)
Attempted to wake pt again at this time. When stating pt first name loudly he briefly opened his eye then closed them. When trying to stimulate pt to take medications pt would not open eyes or communicate with this RN. Unable to administer medications at this time.

## 2019-07-02 NOTE — BH Assessment (Signed)
**  BED CANCELED DUE TO INABILITY TO COMPLETE ADLs WITHOUT ASSISTANCE & SWALLOW SCREENING**

## 2019-07-03 ENCOUNTER — Encounter: Payer: Self-pay | Admitting: Radiology

## 2019-07-03 ENCOUNTER — Other Ambulatory Visit: Payer: Self-pay

## 2019-07-03 ENCOUNTER — Observation Stay: Payer: Medicare Other

## 2019-07-03 DIAGNOSIS — R1310 Dysphagia, unspecified: Secondary | ICD-10-CM

## 2019-07-03 LAB — COMPREHENSIVE METABOLIC PANEL
ALT: 32 U/L (ref 0–44)
AST: 29 U/L (ref 15–41)
Albumin: 3.8 g/dL (ref 3.5–5.0)
Alkaline Phosphatase: 46 U/L (ref 38–126)
Anion gap: 16 — ABNORMAL HIGH (ref 5–15)
BUN: 11 mg/dL (ref 6–20)
CO2: 21 mmol/L — ABNORMAL LOW (ref 22–32)
Calcium: 9.2 mg/dL (ref 8.9–10.3)
Chloride: 106 mmol/L (ref 98–111)
Creatinine, Ser: 0.77 mg/dL (ref 0.61–1.24)
GFR calc Af Amer: >60 mL/min (ref >60)
GFR calc non Af Amer: >60 mL/min (ref >60)
Glucose, Bld: 92 mg/dL (ref 70–99)
Potassium: 3.8 mmol/L (ref 3.5–5.1)
Sodium: 143 mmol/L (ref 135–145)
Total Bilirubin: 1.3 mg/dL — ABNORMAL HIGH (ref 0.3–1.2)
Total Protein: 7.2 g/dL (ref 6.5–8.1)

## 2019-07-03 LAB — CBC
HCT: 41.8 % (ref 39.0–52.0)
Hemoglobin: 14.6 g/dL (ref 13.0–17.0)
MCH: 30.5 pg (ref 26.0–34.0)
MCHC: 34.9 g/dL (ref 30.0–36.0)
MCV: 87.3 fL (ref 80.0–100.0)
Platelets: 157 10*3/uL (ref 150–400)
RBC: 4.79 MIL/uL (ref 4.22–5.81)
RDW: 13.1 % (ref 11.5–15.5)
WBC: 4.7 10*3/uL (ref 4.0–10.5)
nRBC: 0 % (ref 0.0–0.2)

## 2019-07-03 LAB — HIV ANTIBODY (ROUTINE TESTING W REFLEX): HIV Screen 4th Generation wRfx: NONREACTIVE

## 2019-07-03 MED ORDER — SODIUM CHLORIDE 0.9 % IV SOLN
INTRAVENOUS | Status: DC
  Administered 2019-07-03 – 2019-07-07 (×5): via INTRAVENOUS

## 2019-07-03 MED ORDER — LORAZEPAM 2 MG/ML IJ SOLN
1.0000 mg | Freq: Once | INTRAMUSCULAR | Status: AC
  Administered 2019-07-03: 09:00:00 1 mg via INTRAVENOUS
  Filled 2019-07-03: qty 1

## 2019-07-03 MED ORDER — ENOXAPARIN SODIUM 40 MG/0.4ML ~~LOC~~ SOLN
40.0000 mg | SUBCUTANEOUS | Status: DC
  Administered 2019-07-03 – 2019-07-13 (×11): 40 mg via SUBCUTANEOUS
  Filled 2019-07-03 (×11): qty 0.4

## 2019-07-03 MED ORDER — ONDANSETRON HCL 4 MG PO TABS: 4.0000 mg | ORAL_TABLET | Freq: Four times a day (QID) | ORAL | Status: DC | PRN

## 2019-07-03 MED ORDER — ACETAMINOPHEN 650 MG RE SUPP
650.0000 mg | Freq: Four times a day (QID) | RECTAL | Status: DC | PRN
  Administered 2019-07-04: 650 mg via RECTAL
  Filled 2019-07-03: qty 1

## 2019-07-03 MED ORDER — ALUM & MAG HYDROXIDE-SIMETH 200-200-20 MG/5ML PO SUSP
15.0000 mL | Freq: Once | ORAL | Status: AC
  Administered 2019-07-03: 09:00:00 15 mL via ORAL
  Filled 2019-07-03: qty 30

## 2019-07-03 MED ORDER — SENNOSIDES-DOCUSATE SODIUM 8.6-50 MG PO TABS: 1.0000 | ORAL_TABLET | Freq: Every evening | ORAL | Status: DC | PRN

## 2019-07-03 MED ORDER — ACETAMINOPHEN 325 MG PO TABS
650.0000 mg | ORAL_TABLET | Freq: Four times a day (QID) | ORAL | Status: DC | PRN
  Administered 2019-07-13: 650 mg via ORAL
  Filled 2019-07-03 (×2): qty 2

## 2019-07-03 MED ORDER — LIDOCAINE VISCOUS HCL 2 % MT SOLN
15.0000 mL | Freq: Once | OROMUCOSAL | Status: AC
  Administered 2019-07-03: 09:00:00 15 mL via ORAL
  Filled 2019-07-03: qty 15

## 2019-07-03 MED ORDER — ONDANSETRON HCL 4 MG/2ML IJ SOLN
4.0000 mg | Freq: Four times a day (QID) | INTRAMUSCULAR | Status: DC | PRN
  Administered 2019-07-14: 4 mg via INTRAVENOUS
  Filled 2019-07-03: qty 2

## 2019-07-03 MED ORDER — IOHEXOL 300 MG/ML  SOLN
100.0000 mL | Freq: Once | INTRAMUSCULAR | Status: AC | PRN
  Administered 2019-07-03: 100 mL via INTRAVENOUS

## 2019-07-03 NOTE — ED Notes (Signed)
Called pt father at pt request and phone given to pt

## 2019-07-03 NOTE — ED Provider Notes (Signed)
-----------------------------------------   7:21 AM on 07/03/2019 -----------------------------------------  Blood pressure (!) 106/58, pulse 62, temperature 98.6 F (37 C), resp. rate 14, height 5\' 5"  (1.651 m), weight 65.8 kg, SpO2 98 %.  The patient is calm and cooperative at this time.  There have been no acute events since the last update.  Awaiting disposition plan from Behavioral Medicine team.  ----------------------------------------- 8:37 AM on 07/03/2019 -----------------------------------------  Notified by nursing that patient continues to refuse to eat or drink, has not had much in the past 24 hours.  Additionally, he is now complaining of pain in his chest on top of his chronic back pain.  EKG was performed and is unremarkable outside of sinus tachycardia.  We will recheck CMP given his ongoing poor p.o. intake.  Suspect reflux is contributing to his described chest pain given his known paraesophageal hernia.  Will trial GI cocktail.  ED ECG REPORT I, Blake Divine, the attending physician, personally viewed and interpreted this ECG.   Date: 07/03/2019  EKG Time: 8:11  Rate: 120  Rhythm: sinus tachycardia  Axis: Normal  Intervals:none  ST&T Change: None  ----------------------------------------- 3:07 PM on 07/03/2019 -----------------------------------------  Patient with no significant improvement in symptoms following GI cocktail, was given dose of Ativan with improvement.  Case was discussed with psychiatry, who states he would not be appropriate for placement at a psych facility given his inability to care for himself.  SLP was consulted and will plan for additional evaluation of his swallowing by barium swallow.  Given his ongoing difficulty swallowing, inability to tolerate p.o., and comorbid psychiatric issues, case was discussed with hospitalist for admission.  GI was consulted by hospitalist and Dr. Allen Norris raised possibility of incarcerated paraesophageal hernia,  recommends CT of abdomen/pelvis with contrast.  This was ordered and hospitalist updated on pending CT scan.    Blake Divine, MD 07/03/19 [phone]

## 2019-07-03 NOTE — ED Notes (Signed)
Oral/facial/hand hygiene performed for anticipation of breakfast arrival

## 2019-07-03 NOTE — ED Notes (Signed)
myself and nurse Helene Kelp  dried and cleaned patient  And applied clean liens .

## 2019-07-03 NOTE — ED Notes (Signed)
Brianna RN performed pericare and change/dried/repositioned pt - incontinent of urine

## 2019-07-03 NOTE — ED Notes (Signed)
Pt given medication in ice cream - he had difficulty swallowing - Dr Charna Archer notified and Speech consult placed

## 2019-07-03 NOTE — ED Notes (Signed)
ST called and stated to keep attempting to feed pt pureed diet unless he appeared to be a choking hazard and then to make NPO until MBSS tomorrow - ST stated that the study the pt had was not concentrated on aspiration and that this study would be more specific - ST also reported that it did not appear to be the hernia that was causing this problem - Dr Charna Archer notified

## 2019-07-03 NOTE — ED Notes (Addendum)
Pt c/o chest pain - Dr Charna Archer notified and VO for EKG given - EKG obtained and given to Dr Charna Archer - he states EKG is normal  Dr Charna Archer also informed that pt has not eaten or drank in 24 hours - pt appears to be unable to swallow - VO for GI cocktail and repeat BMP "later today" if pt continues to have no oral intake  Dr Charna Archer also informed that pt is no longer able to stand/walk and has went from continent to incontinent - provider feels that this is combination of reflux and psychiatric illness

## 2019-07-03 NOTE — ED Notes (Signed)
Spoke to pharmacy and they will send B-12

## 2019-07-03 NOTE — ED Notes (Signed)
Patient trying to get out of bed 

## 2019-07-03 NOTE — ED Notes (Addendum)
Pt breakfast taken to pt - he is unable to feed self but is able to hold cup to drink - pt drank one container of juice and ate pureed pineapple from tray - he was unable to eat waffle or oatmeal - pt coughs frequently throughout eating pineapple and is noted to have difficulty swallowing

## 2019-07-03 NOTE — ED Notes (Addendum)
Pt rings call bell every 3-5 minutes wanting head of bed up or down/wanting to talk/complaining of chest pain/etc (provider is aware of chest pain) - pt is requesting that someone sit and talk with him  Pt given maalox/viscous lidocaine and had difficulty swallowing

## 2019-07-03 NOTE — H&P (Addendum)
[facility] - [place] at [facility]   PATIENT NAME: Troy Fox    MR#:  [id number]  DATE OF BIRTH:  Sep 17, 1970  DATE OF ADMISSION:  07/01/2019  PRIMARY CARE PHYSICIAN: Benita Stabile, MD   REQUESTING/REFERRING PHYSICIAN:   CHIEF COMPLAINT:   Chief Complaint  Patient presents with  . Dysphagia  . Shortness of Breath  . Tremors    HISTORY OF PRESENT ILLNESS: Troy Fox  is a 48 y.o. male with a known history of bipolar disorder, catatonic schizophrenia, GERD, schizoaffective disorder, tardive dyskinesia presented to the emergency room for difficulty swallowing.  Patient unable to swallow food.  He was evaluated by psychiatry but recommended higher level of care.  Patient has trouble swallowing.  He is awake but does not respond to all verbal commands.  Not completely oriented.  Not much history could be obtained from the patient.  Information obtained from the ER physician also states that patient has difficulty in ambulation.  PAST MEDICAL HISTORY:   Past Medical History:  Diagnosis Date  . Bipolar 1 disorder (HCC)   . 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 GERD (gastroesophageal reflux disease)   . Nonverbal   . Schizoaffective disorder (HCC)   . Tardive dyskinesia     PAST SURGICAL HISTORY:  Past Surgical History:  Procedure Laterality Date  . BIOPSY  06/15/2019   Procedure: BIOPSY;  Surgeon: Malissa Hippo, MD;  [facility];  Service: Endoscopy;;  gastric  . ESOPHAGOGASTRODUODENOSCOPY (EGD) WITH PROPOFOL N/A 06/15/2019   Procedure: ESOPHAGOGASTRODUODENOSCOPY (EGD) WITH PROPOFOL;  Surgeon: Malissa Hippo, MD;  [facility];  Service: Endoscopy;  Laterality: N/A;  7:30    SOCIAL HISTORY:  Social History   Tobacco Use  . Smoking status: Never Smoker  . Smokeless tobacco: Never Used  Substance Use Topics  . Alcohol use: Never    Frequency: Never    FAMILY HISTORY:  Family History  Adopted: Yes  Family history  unknown: Yes    DRUG ALLERGIES:  Allergies  Allergen Reactions  . Aripiprazole Other (See Comments)    Causes gambling addiction  *abilify     REVIEW OF SYSTEMS:   Confused MEDICATIONS AT HOME:  Prior to Admission medications   Medication Sig Start Date End Date Taking? Authorizing Provider  acetaminophen (TYLENOL) 500 MG tablet Take 1,000 mg by mouth every 8 (eight) hours as needed for moderate pain.   Yes [provider]  alum & mag hydroxide-simeth (MYLANTA) 200-200-20 MG/5ML suspension Take 30 mLs by mouth every 6 (six) hours as needed for indigestion or heartburn.   Yes [provider]  cyanocobalamin 1000 MCG tablet Take 1 tablet (1,000 mcg total) by mouth daily. 03/05/19  Yes Clapacs, Jackquline Denmark, MD  dicyclomine (BENTYL) 20 MG tablet Take 1 tablet (20 mg total) by mouth 2 (two) times daily as needed for spasms. 05/15/19  Yes Arnaldo Natal, NP  FLUoxetine (PROZAC) 40 MG capsule TAKE 1 CAPSULE BY MOUTH DAILY. Patient taking differently: Take 40 mg by mouth daily.  04/09/19  Yes Clapacs, Jackquline Denmark, MD  OLANZapine (ZYPREXA) 10 MG tablet Take 10 mg by mouth at bedtime.  05/07/19  Yes [provider]  omeprazole (PRILOSEC) 40 MG capsule Take 40 mg by mouth daily.   Yes [provider]  traZODone (DESYREL) 100 MG tablet Take 1 tablet (100 mg total) by mouth at bedtime. 03/05/19  Yes Clapacs, Jackquline Denmark, MD  Valbenazine Tosylate [facility]) 80 MG CAPS  Take 30 mg by mouth daily. Patient taking differently: Take 80 mg by mouth daily.  03/26/19  Yes Pucilowski, Olgierd A, MD      PHYSICAL EXAMINATION:   VITAL SIGNS: Blood pressure (!) 144/91, pulse (!) 124, temperature 98.6 F (37 C), resp. rate (!) 27, height 5\' 5"  (1.651 m), weight 65.8 kg, SpO2 93 %.  GENERAL:  48 y.o.-year-old patient lying in the bed   EYES: Pupils equal, round, reactive to light and accommodation. No scleral icterus. Extraocular muscles intact.  HEENT: Head atraumatic,  normocephalic. Oropharynx and nasopharynx clear.  NECK:  Supple, no jugular venous distention. No thyroid enlargement, no tenderness.  LUNGS: Normal breath sounds bilaterally, no wheezing, rales,rhonchi or crepitation. No use of accessory muscles of respiration.  CARDIOVASCULAR: S1, S2 normal. No murmurs, rubs, or gallops.  ABDOMEN: Soft, nontender, nondistended. Bowel sounds present. No organomegaly or mass.  EXTREMITIES: No pedal edema, cyanosis, or clubbing.  Contractures noted NEUROLOGIC: Awake Not completely oriented Moves extremities PSYCHIATRIC: Could not be completely assessed SKIN: No obvious rash, lesion, or ulcer.   LABORATORY PANEL:   CBC Recent Labs  Lab 07/01/19 1435  WBC 5.4  HGB 15.2  HCT 44.0  PLT 155  MCV 88.0  MCH 30.4  MCHC 34.5  RDW 13.4  LYMPHSABS 0.6*  MONOABS 0.6  EOSABS 0.0  BASOSABS 0.0   ------------------------------------------------------------------------------------------------------------------  Chemistries  Recent Labs  Lab 07/01/19 1435 07/03/19 0844  NA 144 143  K 3.9 3.8  CL 107 106  CO2 24 21*  GLUCOSE 99 92  BUN 16 11  CREATININE 0.73 0.77  CALCIUM 9.5 9.2  AST 33 29  ALT 44 32  ALKPHOS 43 46  BILITOT 1.1 1.3*   ------------------------------------------------------------------------------------------------------------------ estimated creatinine clearance is 97.2 mL/min (by C-G formula based on SCr of 0.77 mg/dL). ------------------------------------------------------------------------------------------------------------------ No results for input(s): TSH, T4TOTAL, T3FREE, THYROIDAB in the last 72 hours.  Invalid input(s): FREET3   Coagulation profile No results for input(s): INR, PROTIME in the last 168 hours. ------------------------------------------------------------------------------------------------------------------- No results for input(s): DDIMER in the last 72  hours. -------------------------------------------------------------------------------------------------------------------  Cardiac Enzymes No results for input(s): CKMB, TROPONINI, MYOGLOBIN in the last 168 hours.  Invalid input(s): CK ------------------------------------------------------------------------------------------------------------------ Invalid input(s): POCBNP  ---------------------------------------------------------------------------------------------------------------  Urinalysis    Component Value Date/Time   COLORURINE YELLOW (A) 07/01/2019 1715   APPEARANCEUR CLEAR (A) 07/01/2019 1715   LABSPEC 1.032 (H) 07/01/2019 1715   PHURINE 6.0 07/01/2019 1715   GLUCOSEU NEGATIVE 07/01/2019 1715   HGBUR NEGATIVE 07/01/2019 [place] 07/01/2019 1715   KETONESUR 80 (A) 07/01/2019 1715   PROTEINUR 30 (A) 07/01/2019 1715   NITRITE NEGATIVE 07/01/2019 1715   LEUKOCYTESUR NEGATIVE 07/01/2019 1715     RADIOLOGY: Dg Chest 2 View  Result Date: 07/01/2019 CLINICAL DATA:  Acute shortness of breath EXAM: CHEST - 2 VIEW COMPARISON:  01/24/2019 chest radiograph FINDINGS: The cardiomediastinal silhouette is unremarkable. Mild peribronchial thickening again noted. There is no evidence of focal airspace disease, pulmonary edema, suspicious pulmonary nodule/mass, pleural effusion, or pneumothorax. No acute bony abnormalities are identified. IMPRESSION: 1. No evidence of acute cardiopulmonary disease. 2. Mild chronic peribronchial thickening. Electronically Signed   By: Margarette Canada M.D.   On: 07/01/2019 15:38   Dg Thoracic Spine 2 View  Result Date: 07/01/2019 CLINICAL DATA:  Acute UPPER back pain. EXAM: THORACIC SPINE 2 VIEWS COMPARISON:  None. FINDINGS: There is no evidence of acute fracture or subluxation. Mild multilevel degenerative disc disease noted. No suspicious focal bony lesions are  present. IMPRESSION: No evidence of acute bony abnormality. Electronically  Signed   By: Harmon PierJeffrey  Hu M.D.   On: 07/01/2019 18:00   Dg Lumbar Spine 2-3 Views  Result Date: 07/01/2019 CLINICAL DATA:  48 year old male with back pain. EXAM: LUMBAR SPINE - 2-3 VIEW COMPARISON:  Lumbar spine radiograph dated 01/24/2019 FINDINGS: There is no acute fracture or subluxation of the lumbar spine. Mild chronic appearing compression fracture of the superior endplate of L4. There is grade 1 L5-S1 retrolisthesis. The bones are mildly osteopenic. The visualized posterior elements are intact. The soft tissues are unremarkable. Oral contrast noted in portions of the colon. IMPRESSION: No acute/traumatic lumbar spine pathology. Electronically Signed   By: Elgie CollardArash  Radparvar M.D.   On: 07/01/2019 18:02    EKG: Orders placed or performed during the hospital encounter of 07/01/19  . ED EKG  . ED EKG  . ED EKG  . ED EKG    IMPRESSION AND PLAN: 48 year old male patient with a known history of bipolar disorder, catatonic schizophrenia, GERD, schizoaffective disorder, tardive dyskinesia presented to the emergency room for difficulty swallowing.  -Dysphagia Could be from underlying psychiatric disorder Admit patient in observation bed IV fluids Clear liquid diet Gastroenterology consult Recent barium swallow shows normal esophagus, paraesophageal hernia  -Catatonic schizophrenia Psychiatry consult Restart Prozac and olanzapine Supportive care  -GERD Oral PPI  -DVT prophylaxis subcu Lovenox daily  -Dehydration IV fluids  -Ambulatory dysfunction Physical therapy evaluation  All the records are reviewed and case discussed with ED provider. Management plans discussed with the patient, family and they are in agreement.  CODE STATUS:Full code Code Status History    Date Active Date Inactive Code Status Order ID Comments User Context   01/29/2019 1504 03/05/2019 1641 Full Code [id number]  Clapacs, Jackquline DenmarkJohn T, MD Inpatient   Advance Care Planning Activity      TOTAL TIME TAKING  CARE OF THIS PATIENT: 52 minutes.    Ihor AustinPavan Pyreddy M.D on 07/03/2019 at 1:26 PM  Between 7am to 6pm - Pager - [phone]  After 6pm go to [email or web address] - password [email or web address] [facility]  [facility]  Office  [phone]  CC: Primary care physician; Benita StabileHall, John Z, MD

## 2019-07-03 NOTE — ED Notes (Addendum)
ST sent message and stated to make pt NPO until MBSS could be obtained tomorrow Dr Charna Archer notified and request that ST be ask if MBSS is needed since pt had one last week ST messaged

## 2019-07-03 NOTE — ED Notes (Signed)
Took crushed pills well with vanilla icecream

## 2019-07-03 NOTE — ED Notes (Signed)
While pt turned/dried/repositioned a bed bath was given and linen changed

## 2019-07-03 NOTE — ED Notes (Signed)
Attempted to call report, nurse to call back. 

## 2019-07-03 NOTE — Consult Note (Signed)
Lucilla Lame, MD [facility]  [address] [place], [place] [address] Phone: [phone] Fax : [phone]  Consultation  Referring Provider:     Dr. Estanislado Pandy Primary Care Physician:  Celene Squibb, MD Primary Gastroenterologist:  Dr. Laural Golden         Reason for Consultation:     Dysphagia  Date of Admission:  07/01/2019 Date of Consultation:  07/03/2019         HPI:   Troy Fox is a 48 y.o. male who was admitted with a history of psychiatric disorders.  The patient has a history of dysphagia and had an upper endoscopy in [place] on the second of this month that showed a paraesophageal hernia.  The patient also had imaging 4 days ago that showed:  IMPRESSION: 1. Moderate-sized paraesophageal hernia. 2. Structurally normal esophagus.  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  When speaking to the patient and ask him to point where his pain is he points to his back.  The decision was considered to possibly be from an underlying psychiatric disorder.  The patient was started on a clear liquid diet and a GI consult was called.  Past Medical History:  Diagnosis Date  . Bipolar 1 disorder ([place])   . 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[place])   . GERD (gastroesophageal reflux disease)   . Nonverbal   . Schizoaffective disorder ([place])   . Tardive dyskinesia     Past Surgical History:  Procedure Laterality Date  . BIOPSY  06/15/2019   Procedure: BIOPSY;  Surgeon: Rogene Houston, MD;  [facility];  Service: Endoscopy;;  gastric  . ESOPHAGOGASTRODUODENOSCOPY (EGD) WITH PROPOFOL N/A 06/15/2019   Procedure: ESOPHAGOGASTRODUODENOSCOPY (EGD) WITH PROPOFOL;  Surgeon: Rogene Houston, MD;  [facility];  Service: Endoscopy;  Laterality: N/A;  7:30    Prior to Admission medications   Medication Sig Start Date End Date Taking? Authorizing Provider   acetaminophen (TYLENOL) 500 MG tablet Take 1,000 mg by mouth every 8 (eight) hours as needed for moderate pain.   Yes [provider]  alum & mag hydroxide-simeth (MYLANTA) 200-200-20 MG/5ML suspension Take 30 mLs by mouth every 6 (six) hours as needed for indigestion or heartburn.   Yes [provider]  cyanocobalamin 1000 MCG tablet Take 1 tablet (1,000 mcg total) by mouth daily. 03/05/19  Yes Clapacs, Madie Reno, MD  dicyclomine (BENTYL) 20 MG tablet Take 1 tablet (20 mg total) by mouth 2 (two) times daily as needed for spasms. 05/15/19  Yes Noralyn Pick, NP  FLUoxetine (PROZAC) 40 MG capsule TAKE 1 CAPSULE BY MOUTH DAILY. Patient taking differently: Take 40 mg by mouth daily.  04/09/19  Yes Clapacs, Madie Reno, MD  OLANZapine (ZYPREXA) 10 MG tablet Take 10 mg by mouth at bedtime.  05/07/19  Yes [provider]  omeprazole (PRILOSEC) 40 MG capsule Take 40 mg by mouth daily.   Yes [provider]  traZODone (DESYREL) 100 MG tablet Take 1 tablet (100 mg total) by mouth at bedtime. 03/05/19  Yes Clapacs, Madie Reno, MD  Valbenazine Tosylate [facility]) 80 MG CAPS Take 30 mg by mouth daily. Patient taking differently: Take 80 mg by mouth daily.  03/26/19  Yes Pucilowski, Marchia Bond, MD    Family History  Adopted: Yes  Family history unknown: Yes     Social History   Tobacco Use  .  Smoking status: Never Smoker  . Smokeless tobacco: Never Used  Substance Use Topics  . Alcohol use: Never    Frequency: Never  . Drug use: Never    Allergies as of 07/01/2019 - Review Complete 07/01/2019  Allergen Reaction Noted  . Aripiprazole Other (See Comments) 03/20/2019    Review of Systems:    All systems reviewed and negative except where noted in HPI.   Physical Exam:  Vital signs in last 24 hours: Temp:  [98.6 F (37 C)-99.2 F (37.3 C)] 98.6 F (37 C) (10/19 2113) Pulse Rate:  [62-151] 120 (10/20 1438) Resp:  [14-27] 18 (10/20 1438) BP: (102-170)/(58-109)  140/97 (10/20 1438) SpO2:  [88 %-98 %] 96 % (10/20 1438)   General:   Pleasant, cooperative in NAD Head:  Normocephalic and atraumatic. Eyes:   No icterus.   Conjunctiva pink. PERRLA. Ears:  Normal auditory acuity. Neck:  Webbed Supple; no masses or thyroidomegaly Lungs: Respirations even and unlabored. Lungs clear to auscultation bilaterally.   No wheezes, crackles, or rhonchi.  Heart:  Regular rate and rhythm;  Without murmur, clicks, rubs or gallops Abdomen:  Soft, nondistended, nontender. Normal bowel sounds. No appreciable masses or hepatomegaly.  No rebound or guarding.  Rectal:  Not performed. Extremities:  Without edema, cyanosis or clubbing. Neurologic: Unable to test Skin:  Intact without significant lesions or rashes. Cervical Nodes:  No significant cervical adenopathy. Psych:  Alert and cooperative.  Poor historian  LAB RESULTS: Recent Labs    07/01/19 1435  WBC 5.4  HGB 15.2  HCT 44.0  PLT 155   BMET Recent Labs    07/01/19 1435 07/03/19 0844  NA 144 143  K 3.9 3.8  CL 107 106  CO2 24 21*  GLUCOSE 99 92  BUN 16 11  CREATININE 0.73 0.77  CALCIUM 9.5 9.2   LFT Recent Labs    07/03/19 0844  PROT 7.2  ALBUMIN 3.8  AST 29  ALT 32  ALKPHOS 46  BILITOT 1.3*   PT/INR No results for input(s): LABPROT, INR in the last 72 hours.  STUDIES: Dg Chest 2 View  Result Date: 07/01/2019 CLINICAL DATA:  Acute shortness of breath EXAM: CHEST - 2 VIEW COMPARISON:  01/24/2019 chest radiograph FINDINGS: The cardiomediastinal silhouette is unremarkable. Mild peribronchial thickening again noted. There is no evidence of focal airspace disease, pulmonary edema, suspicious pulmonary nodule/mass, pleural effusion, or pneumothorax. No acute bony abnormalities are identified. IMPRESSION: 1. No evidence of acute cardiopulmonary disease. 2. Mild chronic peribronchial thickening. Electronically Signed   By: Harmon PierJeffrey  Hu M.D.   On: 07/01/2019 15:38   Dg Thoracic Spine 2 View   Result Date: 07/01/2019 CLINICAL DATA:  Acute UPPER back pain. EXAM: THORACIC SPINE 2 VIEWS COMPARISON:  None. FINDINGS: There is no evidence of acute fracture or subluxation. Mild multilevel degenerative disc disease noted. No suspicious focal bony lesions are present. IMPRESSION: No evidence of acute bony abnormality. Electronically Signed   By: Harmon PierJeffrey  Hu M.D.   On: 07/01/2019 18:00   Dg Lumbar Spine 2-3 Views  Result Date: 07/01/2019 CLINICAL DATA:  48 year old male with back pain. EXAM: LUMBAR SPINE - 2-3 VIEW COMPARISON:  Lumbar spine radiograph dated 01/24/2019 FINDINGS: There is no acute fracture or subluxation of the lumbar spine. Mild chronic appearing compression fracture of the superior endplate of L4. There is grade 1 L5-S1 retrolisthesis. The bones are mildly osteopenic. The visualized posterior elements are intact. The soft tissues are unremarkable. Oral contrast noted in portions of the  colon. IMPRESSION: No acute/traumatic lumbar spine pathology. Electronically Signed   By: Elgie Collard M.D.   On: 07/01/2019 18:02      Impression / Plan:   Assessment: Active Problems:   Dysphagia   Troy Fox is a 48 y.o. y/o male with a history of of GERD and dysphagia.  The patient has had an extensive work-up recently including a upper endoscopy this month and a barium swallow both showing a normal esophagus with a paraesophageal hernia.  There is no sign of any obstructive lesion that is amenable to any endoscopic intervention.  Plan:  The patient should undergo a CT scan of the abdomen to rule out any incarceration of his paraesophageal hernia.  No further GI work-up after that would be required since the patient has had a barium swallow and an EGD within the last 3 weeks.  It is more likely that the patient's multiple psychiatric disorders are contributing to his difficulty swallowing then a functional/structural lesion.  I have discussed my recommendations with the ED team.   Thank you for involving me in the care of this patient.      LOS: 0 days   Midge Minium, MD  07/03/2019, 2:40 PM Pager [phone] 7am-5pm  Check AMION for 5pm -7am coverage and on weekends   Note: This dictation was prepared with Dragon dictation along with smaller phrase technology. Any transcriptional errors that result from this process are unintentional.

## 2019-07-03 NOTE — Progress Notes (Signed)
Pt c/o SOB. O2 97% RA. Resp 19. Pt repositioned. Will continue to monitor.

## 2019-07-03 NOTE — ED Notes (Signed)
Pt remains anxious and is ringing call bell approx every 3-5 minutes with same request as previous

## 2019-07-03 NOTE — ED Notes (Signed)
Pt continues to ring out for head of bed to be put up/down - he is repositioned with each call out

## 2019-07-03 NOTE — Consult Note (Signed)
[facility] Face-to-Face Psychiatry Consult   Reason for Consult: Schizophrenia Referring Physician: pavan Pyreddy Patient Identification: Troy Fox MRN:  [id number] Principal Diagnosis: <principal problem not specified> Diagnosis:  Active Problems:   Dysphagia   Total Time spent with patient: 30 minutes  Subjective:   Troy Fox is a 48 y.o. male patient admitted with difficulty swallowing.  HPI: Patient is a 48 year old male with a history of catatonic schizoaffective disorder and tardive dyskinesia who presents with difficulty swallowing.  Patient was evaluated however evaluation was limited by patient's clinical state.  Patient is nonverbal and gives one-word answers.  Sometimes patient answers in the form of grunts or head nods or shakes.  Patient was able to deny being suicidal.  Patient was able to deny hearing voices.  Chart review shows that this patient had a recent over a month long inpatient stay as well as some ECT treatment but was discharged on his current regimen due to ECT failure and lives with his parents who ensure compliance with his medications.  At this point writer is unsure what further psychiatric care can help the patient.  Writer will reach out to past provider to see if anything further can be done regarding ECT treatment.  At the time patient was admitted he was very sick,  Catatonic and had difficulty eating but did not have any difficulty swallowing.  Past Psychiatric History: Long history of schizoaffective disorder and antipsychotic medications.  Prior success with ECT treatment however last bout of ECT was not successful.  Risk to Self: Suicidal Ideation: No Suicidal Intent: No Is patient at risk for suicide?: No Suicidal Plan?: No Access to Means: No What has been your use of drugs/alcohol within the last 12 months?: Denied use of Alcohol or drugs How many times?: 0 Other Self Harm Risks: Denied Triggers for Past Attempts: None known Intentional Self  Injurious Behavior: None Risk to Others: Homicidal Ideation: No Thoughts of Harm to Others: No Current Homicidal Intent: No Current Homicidal Plan: No Access to Homicidal Means: No Identified Victim: None identified History of harm to others?: No Assessment of Violence: None Noted Does patient have access to weapons?: No Criminal Charges Pending?: No Does patient have a court date: No Prior Inpatient Therapy: Prior Inpatient Therapy: Yes Prior Therapy Dates: 2017 and later (Multiple Hospitalizations in [place]) Prior Therapy Facilty/Provider(s): In [place] and [facility] Reason for Treatment: Catatonia Prior Outpatient Therapy: Prior Outpatient Therapy: No Does patient have an ACCT team?: No Does patient have Intensive In-House Services?  : No Does patient have [facility] services? : No Does patient have P4CC services?: No  Past Medical History:  Past Medical History:  Diagnosis Date  . Bipolar 1 disorder (HCC)   . 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 GERD (gastroesophageal reflux disease)   . Nonverbal   . Schizoaffective disorder (HCC)   . Tardive dyskinesia     Past Surgical History:  Procedure Laterality Date  . BIOPSY  06/15/2019   Procedure: BIOPSY;  Surgeon: Malissa Hippo, MD;  [facility];  Service: Endoscopy;;  gastric  . ESOPHAGOGASTRODUODENOSCOPY (EGD) WITH PROPOFOL N/A 06/15/2019   Procedure: ESOPHAGOGASTRODUODENOSCOPY (EGD) WITH PROPOFOL;  Surgeon: Malissa Hippo, MD;  Location: [facility];  Service: Endoscopy;  Laterality: N/A;  7:30   Family History:  Family History  Adopted: Yes  Family history unknown: Yes   Family Psychiatric  History: N/A Social History:  Social History   Substance and Sexual Activity  Alcohol Use Never  . Frequency: Never  Social History   Substance and Sexual Activity  Drug Use Never    Social History   Socioeconomic History  . Marital status: Single    Spouse name: Not on file  . Number of  children: Not on file  . Years of education: Not on file  . Highest education level: Not on file  Occupational History  . Not on file  Social Needs  . Financial resource strain: Not on file  . Food insecurity    Worry: Not on file    Inability: Not on file  . Transportation needs    Medical: Not on file    Non-medical: Not on file  Tobacco Use  . Smoking status: Never Smoker  . Smokeless tobacco: Never Used  Substance and Sexual Activity  . Alcohol use: Never    Frequency: Never  . Drug use: Never  . Sexual activity: Not Currently  Lifestyle  . Physical activity    Days per week: Not on file    Minutes per session: Not on file  . Stress: Not on file  Relationships  . Social Musicianconnections    Talks on phone: Not on file    Gets together: Not on file    Attends religious service: Not on file    Active member of club or organization: Not on file    Attends meetings of clubs or organizations: Not on file    Relationship status: Not on file  Other Topics Concern  . Not on file  Social History Narrative  . Not on file   Additional Social History:    Allergies:   Allergies  Allergen Reactions  . Aripiprazole Other (See Comments)    Causes gambling addiction  *abilify     Labs:  Results for orders placed or performed during the hospital encounter of 07/01/19 (from the past 48 hour(s))  SARS Coronavirus 2 by RT PCR (hospital order, [facility]) Nasopharyngeal Nasopharyngeal Swab     Status: None   Collection Time: 07/01/19  9:04 PM   Specimen: Nasopharyngeal Swab  Result Value Ref Range   SARS Coronavirus 2 NEGATIVE NEGATIVE    Comment: (NOTE) If result is NEGATIVE SARS-CoV-2 target nucleic acids are NOT DETECTED. The SARS-CoV-2 RNA is generally detectable in upper and lower  respiratory specimens during the acute phase of infection. The lowest  concentration of SARS-CoV-2 viral copies this assay can detect is 250  copies / mL. A negative  result does not preclude SARS-CoV-2 infection  and should not be used as the sole basis for treatment or other  patient management decisions.  A negative result may occur with  improper specimen collection / handling, submission of specimen other  than nasopharyngeal swab, presence of viral mutation(s) within the  areas targeted by this assay, and inadequate number of viral copies  (<250 copies / mL). A negative result must be combined with clinical  observations, patient history, and epidemiological information. If result is POSITIVE SARS-CoV-2 target nucleic acids are DETECTED. The SARS-CoV-2 RNA is generally detectable in upper and lower  respiratory specimens dur ing the acute phase of infection.  Positive  results are indicative of active infection with SARS-CoV-2.  Clinical  correlation with patient history and other diagnostic information is  necessary to determine patient infection status.  Positive results do  not rule out bacterial infection or co-infection with other viruses. If result is PRESUMPTIVE POSTIVE SARS-CoV-2 nucleic acids MAY BE PRESENT.   A presumptive positive result was  obtained on the submitted specimen  and confirmed on repeat testing.  While 2019 novel coronavirus  (SARS-CoV-2) nucleic acids may be present in the submitted sample  additional confirmatory testing may be necessary for epidemiological  and / or clinical management purposes  to differentiate between  SARS-CoV-2 and other Sarbecovirus currently known to infect humans.  If clinically indicated additional testing with an alternate test  methodology [phone]) is advised. The SARS-CoV-2 RNA is generally  detectable in upper and lower respiratory sp ecimens during the acute  phase of infection. The expected result is Negative. Fact Sheet for Patients:  BoilerBrush.com.cy Fact Sheet for Healthcare Providers: https://pope.com/ This test is not yet  approved or cleared by the [place] FDA and has been authorized for detection and/or diagnosis of SARS-CoV-2 by FDA under an Emergency Use Authorization (EUA).  This EUA will remain in effect (meaning this test can be used) for the duration of the COVID-19 declaration under Section 564(b)(1) of the Act, 21 U.S.C. section 360bbb-3(b)(1), unless the authorization is terminated or revoked sooner. Performed at [facility], [address] [place], [place] [address]   Comprehensive metabolic panel     Status: Abnormal   Collection Time: 07/03/19  8:44 AM  Result Value Ref Range   Sodium 143 135 - 145 mmol/L   Potassium 3.8 3.5 - 5.1 mmol/L   Chloride 106 98 - 111 mmol/L   CO2 21 (L) 22 - 32 mmol/L   Glucose, Bld 92 70 - 99 mg/dL   BUN 11 6 - 20 mg/dL   Creatinine, Ser 8.11 0.61 - 1.24 mg/dL   Calcium 9.2 8.9 - 91.4 mg/dL   Total Protein 7.2 6.5 - 8.1 g/dL   Albumin 3.8 3.5 - 5.0 g/dL   AST 29 15 - 41 U/L   ALT 32 0 - 44 U/L   Alkaline Phosphatase 46 38 - 126 U/L   Total Bilirubin 1.3 (H) 0.3 - 1.2 mg/dL   GFR calc non Af Amer >60 >60 mL/min   GFR calc Af Amer >60 >60 mL/min   Anion gap 16 (H) 5 - 15    Comment: Performed at [facility], [address] [place], [place] [address]  CBC     Status: None   Collection Time: 07/03/19  3:47 PM  Result Value Ref Range   WBC 4.7 4.0 - 10.5 K/uL   RBC 4.79 4.22 - 5.81 MIL/uL   Hemoglobin 14.6 13.0 - 17.0 g/dL   HCT 62.1 30.8 - 65.7 %   MCV 87.3 80.0 - 100.0 fL   MCH 30.5 26.0 - 34.0 pg   MCHC 34.9 30.0 - 36.0 g/dL   RDW 84.6 96.2 - 95.2 %   Platelets 157 150 - 400 K/uL   nRBC 0.0 0.0 - 0.2 %    Comment: Performed at [facility], [address] [place], [place] [address]    Current Facility-Administered Medications  Medication Dose Route Frequency Provider Last Rate Last Dose  . 0.9 %  sodium chloride infusion   Intravenous Continuous Ihor Austin, MD 75 mL/hr at 07/03/19 1701    .  acetaminophen (TYLENOL) tablet 650 mg  650 mg Oral Q6H PRN Pyreddy, Vivien Rota, MD       Or  . acetaminophen (TYLENOL) suppository 650 mg  650 mg Rectal Q6H PRN Pyreddy, Pavan, MD      . acetaminophen (TYLENOL) tablet 1,000 mg  1,000 mg Oral Q8H PRN Concha Se, MD      .  alum & mag hydroxide-simeth (MAALOX/MYLANTA) 200-200-20 MG/5ML suspension 30 mL  30 mL Oral Q6H PRN Vanessa Witt, MD      . dicyclomine (BENTYL) tablet 20 mg  20 mg Oral BID PRN Vanessa Wahkon, MD   20 mg at 07/02/19 2157  . enoxaparin (LOVENOX) injection 40 mg  40 mg Subcutaneous Q24H Pyreddy, Pavan, MD      . FLUoxetine (PROZAC) capsule 40 mg  40 mg Oral Daily Vanessa Muenster, MD   40 mg at 07/03/19 1058  . OLANZapine (ZYPREXA) tablet 10 mg  10 mg Oral QHS Vanessa [place], MD   10 mg at 07/02/19 2156  . ondansetron (ZOFRAN) tablet 4 mg  4 mg Oral Q6H PRN Pyreddy, Reatha Harps, MD       Or  . ondansetron (ZOFRAN) injection 4 mg  4 mg Intravenous Q6H PRN Pyreddy, Pavan, MD      . pantoprazole (PROTONIX) EC tablet 40 mg  40 mg Oral Daily Vanessa Bland, MD   40 mg at 07/03/19 1058  . senna-docusate (Senokot-S) tablet 1 tablet  1 tablet Oral QHS PRN Saundra Shelling, MD      . traZODone (DESYREL) tablet 100 mg  100 mg Oral QHS Vanessa Clarkson, MD   100 mg at 07/02/19 2156  . Valbenazine Tosylate CAPS 80 mg  80 mg Oral QHS Vanessa [place]inedale, MD      . vitamin B-12 (CYANOCOBALAMIN) tablet 1,000 mcg  1,000 mcg Oral Daily Vanessa Schleicher, MD   Stopped at 07/02/19 1110    Musculoskeletal: Strength & Muscle Tone: decreased Gait & Station: unable to stand Patient leans: N/A  Psychiatric Specialty Exam: Physical Exam  ROS unable to obtain  Blood pressure (!) 154/105, pulse (!) 112, temperature 98.9 F (37.2 C), temperature source Oral, resp. rate 19, height 5\' 5"  (1.651 m), weight 65.8 kg, SpO2 97 %.Body mass index is 24.13 kg/m.  General Appearance: Disheveled  Eye Contact:  Poor  Speech:  Negative  Volume:  Normal  Mood:  NA  Affect:  Flat   Thought Process:  NA  Orientation:  NA  Thought Content:  NA  Suicidal Thoughts:  No  Homicidal Thoughts:  No  Memory:  NA  Judgement:  Poor  Insight:  NA  Psychomotor Activity:  Decreased  Concentration:  Concentration: NA  Recall:  NA  Fund of Knowledge:  NA  Language:  NA  Akathisia:  NA  Handed:  Right  AIMS (if indicated):     Assets:  Social Support  ADL's:  Impaired  Cognition:  Impaired,  Severe  Sleep:        Treatment Plan Summary: Daily contact with patient to assess and evaluate symptoms and progress in treatment   Medications: Continue home medications. Olanzapine 10 mg p.o. nightly Fluoxetine 40 mg p.o. daily Valbenazine 80 mg p.o. nightly  Diagnosis: Schizoaffective disorder Disposition: Psychiatry will continue to follow  Dixie Dials, MD 07/03/2019 7:09 PM

## 2019-07-03 NOTE — BH Assessment (Signed)
Per Psych, pt has been cleared psychiatrically and needs a higher level of care.  This information has been relayed to EDP - Dr. Charna Archer.

## 2019-07-04 ENCOUNTER — Observation Stay: Payer: Medicare Other

## 2019-07-04 DIAGNOSIS — K449 Diaphragmatic hernia without obstruction or gangrene: Secondary | ICD-10-CM | POA: Diagnosis present

## 2019-07-04 DIAGNOSIS — R251 Tremor, unspecified: Secondary | ICD-10-CM | POA: Diagnosis present

## 2019-07-04 DIAGNOSIS — Z515 Encounter for palliative care: Secondary | ICD-10-CM | POA: Diagnosis not present

## 2019-07-04 DIAGNOSIS — G2401 Drug induced subacute dyskinesia: Secondary | ICD-10-CM | POA: Diagnosis present

## 2019-07-04 DIAGNOSIS — M545 Low back pain: Secondary | ICD-10-CM | POA: Diagnosis present

## 2019-07-04 DIAGNOSIS — T43505A Adverse effect of unspecified antipsychotics and neuroleptics, initial encounter: Secondary | ICD-10-CM | POA: Diagnosis present

## 2019-07-04 DIAGNOSIS — E876 Hypokalemia: Secondary | ICD-10-CM | POA: Diagnosis not present

## 2019-07-04 DIAGNOSIS — Z66 Do not resuscitate: Secondary | ICD-10-CM | POA: Diagnosis not present

## 2019-07-04 DIAGNOSIS — Z79899 Other long term (current) drug therapy: Secondary | ICD-10-CM | POA: Diagnosis not present

## 2019-07-04 DIAGNOSIS — F251 Schizoaffective disorder, depressive type: Secondary | ICD-10-CM | POA: Diagnosis not present

## 2019-07-04 DIAGNOSIS — K219 Gastro-esophageal reflux disease without esophagitis: Secondary | ICD-10-CM | POA: Diagnosis present

## 2019-07-04 DIAGNOSIS — Z888 Allergy status to other drugs, medicaments and biological substances status: Secondary | ICD-10-CM | POA: Diagnosis not present

## 2019-07-04 DIAGNOSIS — R1312 Dysphagia, oropharyngeal phase: Secondary | ICD-10-CM | POA: Diagnosis not present

## 2019-07-04 DIAGNOSIS — G8929 Other chronic pain: Secondary | ICD-10-CM | POA: Diagnosis present

## 2019-07-04 DIAGNOSIS — R000 Tachycardia, unspecified: Secondary | ICD-10-CM | POA: Diagnosis present

## 2019-07-04 DIAGNOSIS — Z20828 Contact with and (suspected) exposure to other viral communicable diseases: Secondary | ICD-10-CM | POA: Diagnosis present

## 2019-07-04 DIAGNOSIS — E441 Mild protein-calorie malnutrition: Secondary | ICD-10-CM | POA: Diagnosis not present

## 2019-07-04 DIAGNOSIS — E860 Dehydration: Secondary | ICD-10-CM | POA: Diagnosis present

## 2019-07-04 DIAGNOSIS — K2970 Gastritis, unspecified, without bleeding: Secondary | ICD-10-CM | POA: Diagnosis present

## 2019-07-04 DIAGNOSIS — F250 Schizoaffective disorder, bipolar type: Secondary | ICD-10-CM | POA: Diagnosis not present

## 2019-07-04 DIAGNOSIS — Z7189 Other specified counseling: Secondary | ICD-10-CM | POA: Diagnosis not present

## 2019-07-04 DIAGNOSIS — F319 Bipolar disorder, unspecified: Secondary | ICD-10-CM | POA: Diagnosis not present

## 2019-07-04 DIAGNOSIS — F202 Catatonic schizophrenia: Secondary | ICD-10-CM | POA: Diagnosis present

## 2019-07-04 DIAGNOSIS — R627 Adult failure to thrive: Secondary | ICD-10-CM | POA: Diagnosis present

## 2019-07-04 DIAGNOSIS — R1310 Dysphagia, unspecified: Secondary | ICD-10-CM | POA: Diagnosis present

## 2019-07-04 LAB — CBC
HCT: 42.2 % (ref 39.0–52.0)
Hemoglobin: 14.6 g/dL (ref 13.0–17.0)
MCH: 30.2 pg (ref 26.0–34.0)
MCHC: 34.6 g/dL (ref 30.0–36.0)
MCV: 87.2 fL (ref 80.0–100.0)
Platelets: 148 10*3/uL — ABNORMAL LOW (ref 150–400)
RBC: 4.84 MIL/uL (ref 4.22–5.81)
RDW: 13.2 % (ref 11.5–15.5)
WBC: 4.2 10*3/uL (ref 4.0–10.5)
nRBC: 0 % (ref 0.0–0.2)

## 2019-07-04 LAB — BASIC METABOLIC PANEL
Anion gap: 15 (ref 5–15)
BUN: 6 mg/dL (ref 6–20)
CO2: 23 mmol/L (ref 22–32)
Calcium: 9.3 mg/dL (ref 8.9–10.3)
Chloride: 106 mmol/L (ref 98–111)
Creatinine, Ser: 0.72 mg/dL (ref 0.61–1.24)
GFR calc Af Amer: >60 mL/min (ref >60)
GFR calc non Af Amer: >60 mL/min (ref >60)
Glucose, Bld: 87 mg/dL (ref 70–99)
Potassium: 3.7 mmol/L (ref 3.5–5.1)
Sodium: 144 mmol/L (ref 135–145)

## 2019-07-04 MED ORDER — LORAZEPAM 2 MG/ML IJ SOLN
0.5000 mg | Freq: Two times a day (BID) | INTRAMUSCULAR | Status: DC
  Administered 2019-07-04 – 2019-07-08 (×9): 0.5 mg via INTRAVENOUS
  Filled 2019-07-04 (×9): qty 1

## 2019-07-04 MED ORDER — METOPROLOL TARTRATE 5 MG/5ML IV SOLN
5.0000 mg | INTRAVENOUS | Status: DC | PRN
  Administered 2019-07-04: 5 mg via INTRAVENOUS
  Filled 2019-07-04: qty 5

## 2019-07-04 MED ORDER — DIPHENHYDRAMINE HCL 50 MG/ML IJ SOLN: 12.5000 mg | Freq: Two times a day (BID) | INTRAMUSCULAR | Status: DC | PRN

## 2019-07-04 MED ORDER — LORAZEPAM 0.5 MG PO TABS: 0.5000 mg | ORAL_TABLET | Freq: Two times a day (BID) | ORAL | Status: DC

## 2019-07-04 NOTE — Consult Note (Signed)
Troy Fox, Troy Fox [id number] February 26, 1971 Sandi Mealy, MD  Reason for Consult: dysphagia Requesting Physician: Ramonita Lab, MD Consulting Physician: Sandi Mealy  HPI: This 48 y.o. year old male was admitted on 07/01/2019 for Shaking [R25.1] Drooling [K11.7] Psychiatric illness [F99]. Patient has been drooling and has had poor oral intake. 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 Swallowing therapist noted poor oral phase which she felt likely hampered his pharyngeal phase of swallowing, and patient does have tardive dyskinesia. No known h/o CVA, CT of head earlier in the year was negative. He had an upper endoscopy in [place] on the second of this month that showed a paraesophageal hernia.  The patient also had imaging 4 days ago that showed a normal esophagus with a moderate sized paraesophageal hernia.  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  He is limited in communication but did nod yes to question of "sore throat"  Allergies:  Allergies  Allergen Reactions  . Aripiprazole Other (See Comments)    Causes gambling addiction  *abilify     Medications:  Medications Prior to Admission  Medication Sig Dispense Refill  . acetaminophen (TYLENOL) 500 MG tablet Take 1,000 mg by mouth every 8 (eight) hours as needed for moderate pain.    Marland Kitchen alum & mag hydroxide-simeth (MYLANTA) 200-200-20 MG/5ML suspension Take 30 mLs by mouth every 6 (six) hours as needed for indigestion or heartburn.    . cyanocobalamin 1000 MCG tablet Take 1 tablet (1,000 mcg total) by mouth daily. 30 tablet 1  . dicyclomine (BENTYL) 20 MG tablet Take 1 tablet (20 mg total) by mouth 2 (two) times daily as needed for spasms. 20 tablet 0  . FLUoxetine (PROZAC) 40 MG capsule TAKE 1 CAPSULE BY MOUTH DAILY. (Patient taking  differently: Take 40 mg by mouth daily. ) 30 capsule 0  . OLANZapine (ZYPREXA) 10 MG tablet Take 10 mg by mouth at bedtime.     Marland Kitchen omeprazole (PRILOSEC) 40 MG capsule Take 40 mg by mouth daily.    . traZODone (DESYREL) 100 MG tablet Take 1 tablet (100 mg total) by mouth at bedtime. 30 tablet 1  . Valbenazine Tosylate (INGREZZA) 80 MG CAPS Take 30 mg by mouth daily. (Patient taking differently: Take 80 mg by mouth daily. ) 30 capsule 0  .  Current Facility-Administered Medications  Medication Dose Route Frequency Provider Last Rate Last Dose  . 0.9 %  sodium chloride infusion   Intravenous Continuous Ihor Austin, MD 75 mL/hr at 07/04/19 0516    . acetaminophen (TYLENOL) tablet 650 mg  650 mg Oral Q6H PRN Ihor Austin, MD       Or  . acetaminophen (TYLENOL) suppository 650 mg  650 mg Rectal Q6H PRN Ihor Austin, MD   650 mg at 07/04/19 0932  . acetaminophen (TYLENOL) tablet 1,000 mg  1,000 mg Oral Q8H PRN Concha Se, MD      . alum & mag hydroxide-simeth (MAALOX/MYLANTA) 200-200-20 MG/5ML suspension 30 mL  30 mL Oral Q6H PRN Concha Se, MD      . dicyclomine (BENTYL) tablet 20 mg  20 mg Oral BID PRN Concha Se, MD   20 mg at 07/02/19 2157  . diphenhydrAMINE (BENADRYL) injection 12.5 mg  12.5 mg Intravenous BID PRN Ramonita Lab, MD      .  enoxaparin (LOVENOX) injection 40 mg  40 mg Subcutaneous Q24H Ihor AustinPyreddy, Pavan, MD   40 mg at 07/03/19 2026  . FLUoxetine (PROZAC) capsule 40 mg  40 mg Oral Daily Concha SeFunke, Mary E, MD   40 mg at 07/03/19 1058  . LORazepam (ATIVAN) injection 0.5 mg  0.5 mg Intravenous BID Gouru, Aruna, MD   0.5 mg at 07/04/19 1416  . metoprolol tartrate (LOPRESSOR) injection 5 mg  5 mg Intravenous Q4H PRN Gouru, Aruna, MD   5 mg at 07/04/19 1245  . OLANZapine (ZYPREXA) tablet 10 mg  10 mg Oral QHS Concha SeFunke, Mary E, MD   10 mg at 07/03/19 2026  . ondansetron (ZOFRAN) tablet 4 mg  4 mg Oral Q6H PRN Pyreddy, Vivien RotaPavan, MD       Or  . ondansetron (ZOFRAN) injection 4 mg  4 mg  Intravenous Q6H PRN Pyreddy, Pavan, MD      . pantoprazole (PROTONIX) EC tablet 40 mg  40 mg Oral Daily Concha SeFunke, Mary E, MD   40 mg at 07/03/19 1058  . senna-docusate (Senokot-S) tablet 1 tablet  1 tablet Oral QHS PRN Ihor AustinPyreddy, Pavan, MD      . traZODone (DESYREL) tablet 100 mg  100 mg Oral QHS Concha SeFunke, Mary E, MD   100 mg at 07/03/19 2025  . Valbenazine Tosylate CAPS 80 mg  80 mg Oral QHS Concha SeFunke, Mary E, MD   80 mg at 07/03/19 2344  . vitamin B-12 (CYANOCOBALAMIN) tablet 1,000 mcg  1,000 mcg Oral Daily Concha SeFunke, Mary E, MD   Stopped at 07/02/19 1110    PMH:  Past Medical History:  Diagnosis Date  . Bipolar 1 disorder (HCC)   . 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 GERD (gastroesophageal reflux disease)   . Nonverbal   . Schizoaffective disorder (HCC)   . Tardive dyskinesia     Fam Hx:  Family History  Adopted: Yes  Family history unknown: Yes    Soc Hx:  Social History   Socioeconomic History  . Marital status: Single    Spouse name: Not on file  . Number of children: Not on file  . Years of education: Not on file  . Highest education level: Not on file  Occupational History  . Not on file  Social Needs  . Financial resource strain: Not on file  . Food insecurity    Worry: Not on file    Inability: Not on file  . Transportation needs    Medical: Not on file    Non-medical: Not on file  Tobacco Use  . Smoking status: Never Smoker  . Smokeless tobacco: Never Used  Substance and Sexual Activity  . Alcohol use: Never    Frequency: Never  . Drug use: Never  . Sexual activity: Not Currently  Lifestyle  . Physical activity    Days per week: Not on file    Minutes per session: Not on file  . Stress: Not on file  Relationships  . Social Musicianconnections    Talks on phone: Not on file    Gets together: Not on file    Attends religious service: Not on file    Active member of club or organization: Not on file    Attends meetings of clubs or organizations: Not on file     Relationship status: Not on file  . Intimate partner violence    Fear of current or ex partner: Not on file    Emotionally abused: Not on file    Physically  abused: Not on file    Forced sexual activity: Not on file  Other Topics Concern  . Not on file  Social History Narrative  . Not on file    PSH:  Past Surgical History:  Procedure Laterality Date  . BIOPSY  06/15/2019   Procedure: BIOPSY;  Surgeon: Rogene Houston, MD;  [facility];  Service: Endoscopy;;  gastric  . ESOPHAGOGASTRODUODENOSCOPY (EGD) WITH PROPOFOL N/A 06/15/2019   Procedure: ESOPHAGOGASTRODUODENOSCOPY (EGD) WITH PROPOFOL;  Surgeon: Rogene Houston, MD;  Location: [facility];  Service: Endoscopy;  Laterality: N/A;  7:30  . Procedures since admission: No admission procedures for hospital encounter.  ROS: Review of systems normal other than 12 systems except per HPI.  PHYSICAL EXAM Vitals:  Vitals:   07/04/19 1207 07/04/19 1300  BP: (!) 141/89 120/62  Pulse: (!) 115 98  Resp: (!) 22 19  Temp: 98.8 F (37.1 C) 98.8 F (37.1 C)  SpO2: 97% 98%  . General: Disheveled patient in no acute distress Mood: Mood and affect  Cooperative, but poor communication, sedated. Orientation: Sedated but able to follow simple commands. Vocal Quality: Not talking, cannot assess vocal quality. head and Face: NCAT. No facial asymmetry. No visible skin lesions. No significant facial scars. No tenderness with sinus percussion. Facial strength normal and symmetric. Ears: External ears with normal landmarks, no lesions. External auditory canals free of infection, cerumen impaction or lesions. Tympanic membranes intact with good landmarks and normal mobility on pneumatic otoscopy. No middle ear effusion. Hearing: Speech reception grossly normal. Nose: External nose normal with midline dorsum and no lesions or deformity. Nasal Cavity reveals essentially midline septum with normal inferior turbinates. No significant mucosal  congestion or erythema. Nasal secretions are minimal and clear, some anterior crusting. No polyps seen on anterior rhinoscopy. Oral Cavity/ Oropharynx: Lips are normal with no lesions. Oropharynx including tongue, buccal mucosa, floor of mouth, hard and soft palate, uvula and posterior pharynx free of exudates or lesions with normal symmetry. Some nonspecific pharyngeal erythema without exudate and mucous membranes are dry Indirect Laryngoscopy/Nasopharyngoscopy: Visualization of the larynx, hypopharynx and nasopharynx is not possible in this setting with routine examination. Neck: Supple and symmetric with no palpable masses, tenderness or crepitance. The trachea is midline. Thyroid gland is soft, nontender and symmetric with no masses or enlargement. Parotid and submandibular glands are soft, nontender and symmetric, without masses. Lymphatic: Cervical lymph nodes are without palpable lymphadenopathy or tenderness. Respiratory: Normal respiratory effort without labored breathing. Cardiovascular: Carotid pulse shows regular rate and rhythm Neurologic: Cranial Nerves II through XII are grossly intact, but limited ability to assess Eyes: Gaze and Ocular Motility are grossly normal. PERRLA. No visible nystagmus.  MEDICAL DECISION MAKING: Data Review:  Results for orders placed or performed during the hospital encounter of 07/01/19 (from the past 48 hour(s))  Comprehensive metabolic panel     Status: Abnormal   Collection Time: 07/03/19  8:44 AM  Result Value Ref Range   Sodium 143 135 - 145 mmol/L   Potassium 3.8 3.5 - 5.1 mmol/L   Chloride 106 98 - 111 mmol/L   CO2 21 (L) 22 - 32 mmol/L   Glucose, Bld 92 70 - 99 mg/dL   BUN 11 6 - 20 mg/dL   Creatinine, Ser 0.77 0.61 - 1.24 mg/dL   Calcium 9.2 8.9 - 10.3 mg/dL   Total Protein 7.2 6.5 - 8.1 g/dL   Albumin 3.8 3.5 - 5.0 g/dL   AST 29 15 - 41 U/L  ALT 32 0 - 44 U/L   Alkaline Phosphatase 46 38 - 126 U/L   Total Bilirubin 1.3 (H) 0.3 - 1.2  mg/dL   GFR calc non Af Amer >60 >60 mL/min   GFR calc Af Amer >60 >60 mL/min   Anion gap 16 (H) 5 - 15    Comment: Performed at [facility], [address] [place], [place] [address]  HIV Antibody (routine testing w rflx)     Status: None   Collection Time: 07/03/19  3:47 PM  Result Value Ref Range   HIV Screen 4th Generation wRfx NON REACTIVE NON REACTIVE    Comment: Performed at [facility], [address], [place], [place] [address]  CBC     Status: None   Collection Time: 07/03/19  3:47 PM  Result Value Ref Range   WBC 4.7 4.0 - 10.5 K/uL   RBC 4.79 4.22 - 5.81 MIL/uL   Hemoglobin 14.6 13.0 - 17.0 g/dL   HCT 09.8 11.9 - 14.7 %   MCV 87.3 80.0 - 100.0 fL   MCH 30.5 26.0 - 34.0 pg   MCHC 34.9 30.0 - 36.0 g/dL   RDW 82.9 56.2 - 13.0 %   Platelets 157 150 - 400 K/uL   nRBC 0.0 0.0 - 0.2 %    Comment: Performed at [facility], [address] [place], [place] [address]  Basic metabolic panel     Status: None   Collection Time: 07/04/19  3:28 AM  Result Value Ref Range   Sodium 144 135 - 145 mmol/L   Potassium 3.7 3.5 - 5.1 mmol/L   Chloride 106 98 - 111 mmol/L   CO2 23 22 - 32 mmol/L   Glucose, Bld 87 70 - 99 mg/dL   BUN 6 6 - 20 mg/dL   Creatinine, Ser 4.69 0.61 - 1.24 mg/dL   Calcium 9.3 8.9 - 62.9 mg/dL   GFR calc non Af Amer >60 >60 mL/min   GFR calc Af Amer >60 >60 mL/min   Anion gap 15 5 - 15    Comment: Performed at [facility], [address] [place], [place] [address]  CBC     Status: Abnormal   Collection Time: 07/04/19  3:28 AM  Result Value Ref Range   WBC 4.2 4.0 - 10.5 K/uL   RBC 4.84 4.22 - 5.81 MIL/uL   Hemoglobin 14.6 13.0 - 17.0 g/dL   HCT 32.4 40.1 - 02.7 %   MCV 87.2 80.0 - 100.0 fL   MCH 30.2 26.0 - 34.0 pg   MCHC 34.6 30.0 - 36.0 g/dL   RDW 25.3 66.4 - 40.3 %   Platelets 148 (L) 150 - 400 K/uL   nRBC 0.0 0.0 - 0.2 %    Comment: Performed at [facility], [address], [place], [place]  [address]  . Dg Abd 1 View  Result Date: 07/04/2019 CLINICAL DATA:  Evaluate for residual barium in the intestines. EXAM: ABDOMEN - 1 VIEW COMPARISON:  CT 04/27/2019.  Upper GI 06/29/2019 FINDINGS: Oral contrast material is seen throughout the colon, including within diverticula. No small bowel contrast. Gas throughout nondistended large and small bowel. No free air organomegaly. IMPRESSION: Oral contrast material throughout the colon and within colonic diverticula. Electronically Signed   By: Charlett Nose M.D.   On: 07/04/2019 10:33   Ct No Charge  Result Date: 07/03/2019 CLINICAL DATA:  Unexplained dysphasia EXAM: CT ADDITIONAL VIEWS AT NO CHARGE TECHNIQUE: CT  topogram CONTRAST:  None COMPARISON:  None FINDINGS: CT topogram demonstrates large volume of oral barium contrast within the RIGHT and colon and particularly the rectum. high-density barium can degrade diagnostic CT. Recommend waiting for barium to clear colon. IMPRESSION: Repeat KUB prior to CT scan to demonstrate clearance of barium from colon. Electronically Signed   By: Genevive Bi M.D.   On: 07/03/2019 16:06  .   PROCEDURE: Procedure: Diagnostic Fiberoptic Nasolaryngoscopy Diagnosis: Dysphagia Indications: Evaluate dysphagia Findings:Nasal cavity and nasopharynx clear. TVC are clear and mobile. No lesions of the larynx or hypopharynx. Moderate lingual tonsillar hypertrophy, but no gross infection and no masses. Dry mucous membranes Description of Procedure: After discussing procedure and risks  (primarily nose bleed) with the patient, the nose was anesthetized with topical Lidocaine 4% and decongested with phenylephrine. A flexible fiberoptic scope was passed through the left nasal cavity. The nasal cavity was inspected and the scope passed through the Nasopharynx to the region of the hypopharynx and larynx. The patient was instructed to phonate to assess vocal cord mobility. The tongue was extended to evaluate the tongue base  completely. Findings are as noted above. The scope was withdrawn. The patient tolerated the procedure well.  ASSESSMENT: Dysphagia of uncertain etiology, though agree that his psychiatric issues and associated tardive dyskinesia are likely the main cause of his inability to swallow. Poor hydration of the mucous membranes probably contributes to sore throat.  PLAN: Since the patient is of limited ability to communicate I went ahead and sent a throat culture just to exclude infection/pharyngitis. He does not appear to have a fever however and white count is not elevated so I suspect this will be of limited benefit. Otherwise I see no upper pharyngeal impediment to swallowing other than the previously noted motor control issues from his tardive dyskinesia. Continued work with the swallowing therapist and getting his underlying psychiatric conditions improved are the main recommendations I could suggest, and input from psychiatry regarding his medications may be useful. Apparently olanzapine can cause tardive dyskinesia and  trazodone can cause tardive dyskinesia in elderly patients with schizophrenia, but would leave medication adjustment obviously to psychiatry.    Sandi Mealy, MD 07/04/2019 5:30 PM

## 2019-07-04 NOTE — Care Management Obs Status (Signed)
[place] NOTIFICATION   Patient Details  Name: Troy Fox MRN: [id number] Date of Birth: 1971-06-16   Medicare Observation Status Notification Given:  Yes(Discussed with father over the phone. Will email for his records.)    Candie Chroman, LCSW 07/04/2019, 1:46 PM

## 2019-07-04 NOTE — Progress Notes (Signed)
[facility] - [place] at [facility]   PATIENT NAME: Troy Fox    MR#:  [id number]  DATE OF BIRTH:  12-05-1970  SUBJECTIVE:  CHIEF COMPLAINT:    REVIEW OF SYSTEMS:  CONSTITUTIONAL: No fever, fatigue or weakness.  EYES: No blurred or double vision.  EARS, NOSE, AND THROAT: No tinnitus or ear pain.  RESPIRATORY: No cough, shortness of breath, wheezing or hemoptysis.  CARDIOVASCULAR: No chest pain, orthopnea, edema.  GASTROINTESTINAL: No nausea, vomiting, diarrhea or abdominal pain.  GENITOURINARY: No dysuria, hematuria.  ENDOCRINE: No polyuria, nocturia,  HEMATOLOGY: No anemia, easy bruising or bleeding SKIN: No rash or lesion. MUSCULOSKELETAL: No joint pain or arthritis.   NEUROLOGIC: No tingling, numbness, weakness.  PSYCHIATRY: No anxiety or depression.   DRUG ALLERGIES:   Allergies  Allergen Reactions  . Aripiprazole Other (See Comments)    Causes gambling addiction  *abilify     VITALS:  Blood pressure 120/62, pulse 98, temperature 98.8 F (37.1 C), temperature source Oral, resp. rate 19, height 5\' 5"  (1.651 m), weight 65.8 kg, SpO2 98 %.  PHYSICAL EXAMINATION:  GENERAL:  48 y.o.-year-old patient lying in the bed with no acute distress.  EYES: Pupils equal, round, reactive to light and accommodation. No scleral icterus. Extraocular muscles intact.  HEENT: Head atraumatic, normocephalic. Oropharynx and nasopharynx clear.  NECK:  Supple, no jugular venous distention. No thyroid enlargement, no tenderness.  LUNGS: Normal breath sounds bilaterally, no wheezing, rales,rhonchi or crepitation. No use of accessory muscles of respiration.  CARDIOVASCULAR: S1, S2 normal. No murmurs, rubs, or gallops.  ABDOMEN: Soft, nontender, nondistended. Bowel sounds present. No organomegaly or mass.  EXTREMITIES: No pedal edema, cyanosis, or clubbing.  NEUROLOGIC: Cranial nerves II through XII are intact. Muscle strength 5/5 in all extremities. Sensation intact.  Gait not checked.  PSYCHIATRIC: The patient is alert and oriented x 3.  SKIN: No obvious rash, lesion, or ulcer.    LABORATORY PANEL:   CBC Recent Labs  Lab 07/04/19 0328  WBC 4.2  HGB 14.6  HCT 42.2  PLT 148*   ------------------------------------------------------------------------------------------------------------------  Chemistries  Recent Labs  Lab 07/03/19 0844 07/04/19 0328  NA 143 144  K 3.8 3.7  CL 106 106  CO2 21* 23  GLUCOSE 92 87  BUN 11 6  CREATININE 0.77 0.72  CALCIUM 9.2 9.3  AST 29  --   ALT 32  --   ALKPHOS 46  --   BILITOT 1.3*  --    ------------------------------------------------------------------------------------------------------------------  Cardiac Enzymes No results for input(s): TROPONINI in the last 168 hours. ------------------------------------------------------------------------------------------------------------------  RADIOLOGY:  Dg Abd 1 View  Result Date: 07/04/2019 CLINICAL DATA:  Evaluate for residual barium in the intestines. EXAM: ABDOMEN - 1 VIEW COMPARISON:  CT 04/27/2019.  Upper GI 06/29/2019 FINDINGS: Oral contrast material is seen throughout the colon, including within diverticula. No small bowel contrast. Gas throughout nondistended large and small bowel. No free air organomegaly. IMPRESSION: Oral contrast material throughout the colon and within colonic diverticula. Electronically Signed   By: 07/01/2019 M.D.   On: 07/04/2019 10:33   Ct No Charge  Result Date: 07/03/2019 CLINICAL DATA:  Unexplained dysphasia EXAM: CT ADDITIONAL VIEWS AT NO CHARGE TECHNIQUE: CT topogram CONTRAST:  None COMPARISON:  None FINDINGS: CT topogram demonstrates large volume of oral barium contrast within the RIGHT and colon and particularly the rectum. high-density barium can degrade diagnostic CT. Recommend waiting for barium to clear colon. IMPRESSION: Repeat KUB prior to CT scan to demonstrate clearance  of barium from colon.  Electronically Signed   By: Suzy Bouchard M.D.   On: 07/03/2019 16:06    EKG:   Orders placed or performed during the hospital encounter of 07/01/19  . ED EKG  . ED EKG  . ED EKG  . ED EKG    ASSESSMENT AND PLAN:    48 year old male patient with a known history of bipolar disorder, catatonic schizophrenia, GERD, schizoaffective disorder, tardive dyskinesia presented to the emergency room for difficulty swallowing.  -Dysphagia Could be from underlying psychiatric disorder IV fluids Clear liquid diet ordered but patient is not tolerating liquid diet Gastroenterology -Dr. Allen Norris has seen the patient Recent barium swallow shows normal esophagus, paraesophageal hernia ENT consulted and Dr. Richardson Landry is aware of the consult planning to do a bedside scope if patient is cooperating, father is going to assist if needed   -Catatonic schizophrenia Psychiatry consult, Dr. Claris Gower is following collaborating with the previous psychiatrist to see if ECT is an option Recommending to resume Prozac and olanzapine, but patient is not swallowing, discussed with psychiatry Dr. Tula Nakayama Supportive care  -GERD Oral PPI  -DVT prophylaxis subcu Lovenox daily  -Dehydration IV fluids  -Ambulatory dysfunction Physical therapy evaluation, x-ray of the lumbar thoracic spine Patient ambulates at home with the help of walker as endorsed by the dad     All the records are reviewed and case discussed with Care Management/Social Workerr. Management plans discussed with the patient, patients dad -  and they are in agreement.  CODE STATUS: fc   TOTAL TIME TAKING CARE OF THIS PATIENT: 39  minutes.   POSSIBLE D/C IN 2-3  DAYS, DEPENDING ON CLINICAL CONDITION.  Note: This dictation was prepared with Dragon dictation along with smaller phrase technology. Any transcriptional errors that result from this process are unintentional.   Nicholes Mango M.D on 07/04/2019 at 2:47 PM  Between 7am to  6pm - Pager - [phone] After 6pm go to [email or web address] - password [email or web address] [facility]  Office  [phone]  CC: Primary care physician; Celene Squibb, MD

## 2019-07-04 NOTE — Plan of Care (Signed)

## 2019-07-04 NOTE — Progress Notes (Addendum)
Called speech to report pt is not swallowing meds. Pt is keeping meds in mouth. RN suctioned to remove med. Suggested psych to reevaluate meds to MD Gouru because pt will can not swallow and has not had psych meds days prior to coming to ED. Pt is becoming more agitated. Order for lopressor.  21 father called, RN updated father. He request call from MD and states son will become agitated without his ativan twice a day. Notified MD gouru

## 2019-07-04 NOTE — Progress Notes (Signed)
   07/04/19 0900  Pain Assessment  Pain Scale 0-10  Pain Score 9  Pain Type Acute pain  Pain Location Back  Pain Intervention(s) Medication (See eMAR)  notified MD  Gouru

## 2019-07-04 NOTE — TOC Initial Note (Signed)
Transition of Care [facility]) - Initial/Assessment Note    Patient Details  Name: Troy Fox MRN: [id number] Date of Birth: [birth year]  [facility]) CM/SW Contact:    Candie Chroman, LCSW Phone Number: 07/04/2019, 1:52 PM  Clinical Narrative: Patient only oriented to self. CSW called patient's father, introduced role, and explained that PT recommendations would be discussed. Patient's father agreeable to SNF placement if needed. PT will hopefully see him again today or tomorrow. Patient uses a walker at home. No further concerns. CSW encouraged patient's father to contact CSW as needed. CSW will continue to follow patient and his father for support and facilitate discharge to SNF, if needed, once medically stable.                Expected Discharge Plan: [place]     Patient Goals and CMS Choice Patient states their goals for this hospitalization and ongoing recovery are:: Patient not fully oriented.      Expected Discharge Plan and Services Expected Discharge Plan: [place]       Living arrangements for the past 2 months: Single Family Home                                      Prior Living Arrangements/Services Living arrangements for the past 2 months: Single Family Home   Patient language and need for interpreter reviewed:: Yes Do you feel safe going back to the place where you live?: Yes      Need for Family Participation in Patient Care: Yes (Comment) Care giver support system in place?: Yes (comment) Current home services: DME Criminal Activity/Legal Involvement Pertinent to Current Situation/Hospitalization: No - Comment as needed  Activities of Daily Living Home Assistive Devices/Equipment: Walker (specify type), Grab bars in shower(2 wheel walker) ADL Screening (condition at time of admission) Patient's cognitive ability adequate to safely complete daily activities?: No Is the patient deaf or have difficulty  hearing?: No Does the patient have difficulty seeing, even when wearing glasses/contacts?: No Does the patient have difficulty concentrating, remembering, or making decisions?: Yes Patient able to express need for assistance with ADLs?: Yes Does the patient have difficulty dressing or bathing?: Yes Independently performs ADLs?: No Communication: Independent Dressing (OT): Needs assistance Is this a change from baseline?: Pre-admission baseline Grooming: Needs assistance Is this a change from baseline?: Pre-admission baseline Feeding: Needs assistance(Only has soft foods, cannot eat meats) Is this a change from baseline?: Pre-admission baseline Bathing: Independent Toileting: Independent In/Out Bed: Needs assistance Is this a change from baseline?: Pre-admission baseline Walks in Home: Independent with device (comment) Does the patient have difficulty walking or climbing stairs?: Yes Weakness of Legs: None(Trouble walking due to Tardive Dyskinesia) Weakness of Arms/Hands: None  Permission Sought/Granted Permission sought to share information with : Family Supports    Share Information with NAME: Demetrice Combes  Permission granted to share info w AGENCY: SNF's  Permission granted to share info w Relationship: Father  Permission granted to share info w Contact Information: [phone]  Emotional Assessment Appearance:: Appears stated age Attitude/Demeanor/Rapport: Unable to Assess Affect (typically observed): Unable to Assess Orientation: : Oriented to Self Alcohol / Substance Use: Never Used Psych Involvement: Yes (comment)  Admission diagnosis:  Shaking [R25.1] Drooling [K11.7] Psychiatric illness [F99] Patient Active Problem List   Diagnosis Date Noted  . Dysphagia 07/03/2019  . Abdominal pain, epigastric 05/15/2019  . Diarrhea 05/15/2019  .  Hiatal hernia 05/15/2019  . Schizoaffective disorder, depressive type (HCC) 01/29/2019  . Catatonia 01/29/2019  . Mild  malnutrition (HCC) 01/29/2019  . Schizoaffective disorder, bipolar type (HCC) 01/03/2019  . Acute neuroleptic-induced akathisia 01/03/2019   PCP:  Benita Stabile, MD Pharmacy:   [facility] - [place], [place] - [address] [place] [address] Phone: [phone] Fax: [phone]  [facility] - [place], [place] - [address] [place] [address] Phone: [phone] Fax: [phone]     Social Determinants of Health (SDOH) Interventions    Readmission Risk Interventions No flowsheet data found.

## 2019-07-04 NOTE — Progress Notes (Signed)
pt uable to swallow morning meds. c/o pain 9/10 in back  unable to swallow tylenol will give rectal notified MD Gouru

## 2019-07-04 NOTE — Progress Notes (Signed)
PT Cancellation Note  Patient Details Name: Troy Fox MRN: [id number] DOB: 24-Jan-1971   Cancelled Treatment:    Reason Eval/Treat Not Completed: Other (comment);Fatigue/lethargy limiting ability to participate. Chart reviewed and treatment attempted. Pt was given Ativan and in only able to respond with brief periods of opening eyes. Pt's father arrived concurrent with therapist and attempted to speak with pt as well. Father feels pt is heading toward catatonia (again) and will need further treatment (ECT) to "come out of it". Nursing notified that father is here, as Father states MD called for him to come in to assist during a scope study. Nursing to notify MD. Re attempt when pt able to participate with PT.    Larae Grooms, PTA 07/04/2019, 4:04 PM

## 2019-07-05 ENCOUNTER — Other Ambulatory Visit: Payer: Self-pay | Admitting: Psychiatry

## 2019-07-05 MED ORDER — BOOST / RESOURCE BREEZE PO LIQD CUSTOM
1.0000 | Freq: Three times a day (TID) | ORAL | Status: DC
  Administered 2019-07-06 – 2019-07-08 (×6): 1 via ORAL

## 2019-07-05 MED ORDER — SODIUM CHLORIDE 0.9 % IV SOLN
500.0000 mL | Freq: Once | INTRAVENOUS | Status: AC
  Administered 2019-07-06: 09:00:00 500 mL via INTRAVENOUS

## 2019-07-05 NOTE — Progress Notes (Addendum)
Speech Therapy Note: this pt was known to [place] while in the ED - a BSE was requested and performed at that time. Pt was dx'd w/ presentation of oropharyngeal phase dysphagia w/ suspected impact from Psychiatric issues and declined medical status overall. Per BSE om 07/02/2019 by this SLP: "Moderate oral phase dysphagia at this time - suspect impacted by declined mental status and Tardive Dyskinesia; medications. Any oral phase dyspahgia increases risk for pharyngeal phase deficits to occur, even aspiration/choking. Oral phase deficits also increase risk for inadequate nutrition/hydration(pt has a Baseline of MILD Malnutrition per H/P and has recently lost weight per report)." A dysphagia diet was recommended at the time w/ f/u by objective swallow assessment(MBSS). MD/NSG aware. Further orders were discontinued apparently as pt was transferred and admitted to the floor/hospital.  Today, NSG contacted this SLP re: pt's swallowing status and presentation. Pt has had continued poor oral intake in hospital since admit; NSG had to suction pt's mouth d/t inability to complete swallowing of pills this AM; this has occurred previously per chart notes. Pt is now without adequate nutrition and means to take his Medications for ~5 days. It is undetermined what is causing p's dysphagia at this time per MD notes; Psychiatry was following pt and recommending continue w/ his Psychiatric medications (which NSG reported he is unable to do at this time). Recommended NSG contact RD and MD w/ concerns of pt being unable to take both adequate nutrition/hdyration and his necessary Psychiatric medications (which could help aid his swallowing function). 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  ST services can be available for further assessment of pt's oropharyngeal swallow function after pt has received adequate nutrition/medications necessary. Recommend frequent oral care for  hygiene and stimulation of swallowing while NPO. NSG updated and will f/u w/ MD. Dietician contacted also.     Orinda Kenner, [place], CCC-SLP

## 2019-07-05 NOTE — Progress Notes (Addendum)
Initial Nutrition Assessment  DOCUMENTATION CODES:   Not applicable  INTERVENTION:   Boost Breeze po TID, each supplement provides 250 kcal and 9 grams of protein  Recommend fluoroscopy guided NGT placement and nutrition support  If tube feeds initiated, recommend:  Jevity 1.5 @45ml /hr- Initiate at 8ml/hr and increase by 23ml/hr q12 hours until goal rate is reached   Prostat liquid protein 30 ml daily via tube, each supplement provides 100 kcal, 15 grams protein.  Free water flushes 9m q4 hours  Regimen provides 1720kcal/day, 84g/day protein, 23g/day fiber, 1720ml/day free water   Pt is at high refeed risk; recommend monitor K, Mg and P labs daily   NUTRITION DIAGNOSIS:   Inadequate oral intake related to dysphagia as evidenced by other (comment)(per chart review).  GOAL:   Patient will meet greater than or equal to 90% of their needs  MONITOR:   PO intake, Supplement acceptance, Labs, Weight trends, Skin, I & O's  REASON FOR ASSESSMENT:   Consult Assessment of nutrition requirement/status  ASSESSMENT:   48 year old male patient with a known history of bipolar disorder, catatonic schizophrenia, GERD, schizoaffective disorder, tardive dyskinesia presented to the emergency room for difficulty swallowing   Visited pt's room today. Unable to communicate with patient as pt is non-verbal. Per chart review, pt with dysphagia and trouble getting food down for 3 weeks pta. Spoke with RN, patient has continued to have poor appetite and oral intake in hospital since admit; pt is now without adequate nutrition for 5 days. Pt is noted to have moderate sized paraesophageal hernia. It is undetermined what is causing patient's dysphagia at this time. RD will add supplements to help pt meet his estimated needs. Would recommend consider fluoroscopy guided NGT placement and nutrition support to provide temporary nutrition and medication access to see if patient's dysphagia will improve.  If this is felt to be more of a chronic condition, may need to consider PEG tube placement in the future.   Per chart, pt appears to have lost ~20lbs(12%) over the past 2 months; this is significant weight loss.   Medications reviewed and include: lovenox, protonix, B12, NaCl @75ml /hr  Labs reviewed:   NUTRITION - FOCUSED PHYSICAL EXAM:    Most Recent Value  Orbital Region  No depletion  Upper Arm Region  No depletion  Thoracic and Lumbar Region  No depletion  Buccal Region  No depletion  Temple Region  Mild depletion  Clavicle Bone Region  Mild depletion  Clavicle and Acromion Bone Region  Mild depletion  Scapular Bone Region  No depletion  Dorsal Hand  Mild depletion  Patellar Region  No depletion  Anterior Thigh Region  Mild depletion  Posterior Calf Region  Mild depletion  Edema (RD Assessment)  None  Hair  Reviewed  Eyes  Reviewed  Mouth  Reviewed  Skin  Reviewed  Nails  Reviewed     Diet Order:   Diet Order            Diet clear liquid Room service appropriate? Yes; Fluid consistency: Thin  Diet effective now             EDUCATION NEEDS:   Not appropriate for education at this time  Skin:  Skin Assessment: Reviewed RN Assessment(ecchymosis)  Last BM:  10/21- type 4  Height:   Ht Readings from Last 1 Encounters:  07/01/19 5\' 5"  (1.651 m)    Weight:   Wt Readings from Last 1 Encounters:  07/01/19 65.8 kg    Ideal  Body Weight:  56.8 kg  BMI:  Body mass index is 24.13 kg/m.  Estimated Nutritional Needs:   Kcal:  1600-1800kcal/day  Protein:  80-90g/day  Fluid:  >1.8L/day  Koleen Distance MS, RD, LDN Pager #- [phone] Office#- [phone] After Hours Pager: [phone]

## 2019-07-05 NOTE — TOC Progression Note (Signed)
Transition of Care [facility]) - Progression Note    Patient Details  Name: Troy Fox MRN: [id number] Date of Birth: 1971/03/28  Transition of Care [facility]) CM/SW [place], LCSW Phone Number: 07/05/2019, 2:15 PM  Clinical Narrative:  Durene Cal out referral to [facility]. Faxed requested documents to [place] [facility] for PASARR review.  Expected Discharge Plan: [place]    Expected Discharge Plan and Services Expected Discharge Plan: [place] arrangements for the past 2 months: Single Family Home                                       Social Determinants of Health (SDOH) Interventions    Readmission Risk Interventions No flowsheet data found.

## 2019-07-05 NOTE — Progress Notes (Signed)
Physical Therapy Treatment Patient Details Name: Troy Fox MRN: [id number] DOB: 06/06/1971 Today's Date: 07/05/2019    History of Present Illness Per MD H&P: Pt is a 48 y.o. male with bipolar 1, catatonia schizophrenia, tardive dyskinesia who presents with tremors.  Patient had tremors in the past 3 weeks has had trouble swallowing food.  Patient had a upper GI study that showed normal esophagus with moderate size paraesophageal hernia.  Patient is father noted that he has been having increased drooling and shaking episodes.  Patient was seen here on 10/5 and got Benadryl and Ativan and was better but but then has progressively getting worse over the past 3 weeks where he is not eating very well.  He has been having to grind of his food and do shakes.  She he is not sure if he is been having some shortness of breath.  He also mentioned that he is been having some back pain is been going on for months.  He has had some previous falls none recently.  No urinary incontinence.  No leg weakness.  Patient still is able to walk but has a shuffling gait that is baseline for him.    PT Comments    Pt in bed, wanting to work "Please don't leave me."   To edge of bed with supervision.  Cues to slow down to manage catheter and IV tubing.  Once sitting generally stable but supervision/min guard needed due to impulsivity.  Stood with RW and min guard to transfer to recliner with min a.  He was able to ambulate 10' x 2 to chair in hallway outside of door for seated rest.  Pt requires hands on assist at all times due to very narrow BOS, decreased step height and length and forward lean.  Close min assist needed and hands on assist at all times.  Very high fall risk if gait attempted without proper supervision and assist.  Poor hand placements for transfers.  Pulls up on walker and does not reach back before sitting or turning fully.  Increased assist to stand as he fatigues.  Standing ex as below.  Pt continues to  state several times "Don't leave me" and is very willing to participate to get better "Force me to do it."  He remains in recliner with RN in room.     Follow Up Recommendations  SNF     Equipment Recommendations  Rolling walker with 5" wheels;Other (comment)    Recommendations for Other Services       Precautions / Restrictions Precautions Precautions: Fall Restrictions Weight Bearing Restrictions: No    Mobility  Bed Mobility Overal bed mobility: Needs Assistance Bed Mobility: Supine to Sit     Supine to sit: Min guard;Supervision        Transfers Overall transfer level: Needs assistance Equipment used: Rolling walker (2 wheeled) Transfers: Sit to/from Stand Sit to Stand: Min guard;Mod assist         General transfer comment: increased assist as he fatigued (third attempt), asssit to transition to sitting safely.  Ambulation/Gait Ambulation/Gait assistance: Min assist;Mod assist Gait Distance (Feet): 10 Feet Assistive device: Rolling walker (2 wheeled) Gait Pattern/deviations: Step-to pattern;Decreased step length - right;Decreased step length - left;Shuffle;Trunk flexed;Narrow base of support Gait velocity: decreased   General Gait Details: 10' x 2 very small shuffling steps, stepping on feet at times, leaning forward.  Requires hands on assist at all times.   Stairs  Wheelchair Mobility    Modified Rankin (Stroke Patients Only)       Balance Overall balance assessment: Needs assistance Sitting-balance support: Feet supported Sitting balance-Leahy Scale: Fair     Standing balance support: Bilateral upper extremity supported Standing balance-Leahy Scale: Poor Standing balance comment: requries assist at all times for balance and safety                            Cognition Arousal/Alertness: Awake/alert Behavior During Therapy: Flat affect Overall Cognitive Status: Within Functional Limits for tasks assessed                                  General Comments: mumbles at times but generally able to verbalize needs and be understood.      Exercises Other Exercises Other Exercises: standing marching, SLR 2 x 10 with seated rest.  RW for support and assist for balance.    General Comments        Pertinent Vitals/Pain Pain Assessment: No/denies pain    Home Living                      Prior Function            PT Goals (current goals can now be found in the care plan section) Progress towards PT goals: Progressing toward goals    Frequency    Min 2X/week      PT Plan Current plan remains appropriate    Co-evaluation              AM-PAC PT "6 Clicks" Mobility   Outcome Measure  Help needed turning from your back to your side while in a flat bed without using bedrails?: A Little Help needed moving from lying on your back to sitting on the side of a flat bed without using bedrails?: A Little Help needed moving to and from a bed to a chair (including a wheelchair)?: A Little Help needed standing up from a chair using your arms (e.g., wheelchair or bedside chair)?: A Little Help needed to walk in hospital room?: A Little Help needed climbing 3-5 steps with a railing? : A Lot 6 Click Score: 17    End of Session Equipment Utilized During Treatment: Gait belt Activity Tolerance: Patient tolerated treatment well Patient left: in chair;with call bell/phone within reach;with chair alarm set;with nursing/sitter in room Nurse Communication: Mobility status       Time: 1033-1100 PT Time Calculation (min) (ACUTE ONLY): 27 min  Charges:  $Gait Training: 8-22 mins $Therapeutic Exercise: 8-22 mins                     Danielle Dess, PTA 07/05/19, 11:57 AM

## 2019-07-05 NOTE — NC FL2 (Signed)
[place] LEVEL OF CARE SCREENING TOOL     IDENTIFICATION  Patient Name: Troy Fox Birthdate: 1970-11-24 Sex: male Admission Date (Current Location): 07/01/2019  [place] and [place] Number:  Engineer, manufacturing systems and Address:  [facility], [address], [place], [place] [address]      Provider Number: [id number]  Attending Physician Name and Address:  Saundra Shelling, MD  Relative Name and Phone Number:       Current Level of Care: Hospital Recommended Level of Care: [place] Prior Approval Number:    Date Approved/Denied:   PASRR Number: Manual review  Discharge Plan: SNF    Current Diagnoses: Patient Active Problem List   Diagnosis Date Noted  . Dysphagia 07/03/2019  . Abdominal pain, epigastric 05/15/2019  . Diarrhea 05/15/2019  . Hiatal hernia 05/15/2019  . Schizoaffective disorder, depressive type ([place]) 01/29/2019  . Catatonia 01/29/2019  . Mild malnutrition ([place]) 01/29/2019  . Schizoaffective disorder, bipolar type ([place]) 01/03/2019  . Acute neuroleptic-induced akathisia 01/03/2019    Orientation RESPIRATION BLADDER Height & Weight     Self  Normal Incontinent, External catheter Weight: 145 lb (65.8 kg) Height:  5\' 5"  (165.1 cm)  BEHAVIORAL SYMPTOMS/MOOD NEUROLOGICAL BOWEL NUTRITION STATUS  Other (Comment)(Flat affect, restless.) (None) Continent Diet(Clear liquid)  AMBULATORY STATUS COMMUNICATION OF NEEDS Skin   Limited Assist Verbally Skin abrasions, Bruising                       Personal Care Assistance Level of Assistance              Functional Limitations Info  Sight, Hearing, Speech Sight Info: Adequate Hearing Info: Adequate Speech Info: Adequate(Delayed responses)    SPECIAL CARE FACTORS FREQUENCY  PT (By licensed PT)     PT Frequency: 5 x week              Contractures Contractures Info: Not present    Additional Factors Info  Code Status, Allergies,  Psychotropic Code Status Info: Full code Allergies Info: Aripiprazole Psychotropic Info: Bipolar I Disorder, Catatonia Schizophrenia, Schizoaffective Disorder: Prozac 40 mg PO daily, Zyprexa 10 mg PO QHS, Trazodone 100 mg PO QHS.         Current Medications (07/05/2019):  This is the current hospital active medication list Current Facility-Administered Medications  Medication Dose Route Frequency Provider Last Rate Last Dose  . 0.9 %  sodium chloride infusion   Intravenous Continuous Saundra Shelling, MD 75 mL/hr at 07/04/19 2308    . acetaminophen (TYLENOL) tablet 650 mg  650 mg Oral Q6H PRN Saundra Shelling, MD       Or  . acetaminophen (TYLENOL) suppository 650 mg  650 mg Rectal Q6H PRN Saundra Shelling, MD   650 mg at 07/04/19 0932  . acetaminophen (TYLENOL) tablet 1,000 mg  1,000 mg Oral Q8H PRN Vanessa Smyth, MD      . alum & mag hydroxide-simeth (MAALOX/MYLANTA) 200-200-20 MG/5ML suspension 30 mL  30 mL Oral Q6H PRN Vanessa Paris, MD      . dicyclomine (BENTYL) tablet 20 mg  20 mg Oral BID PRN Vanessa [place], MD   20 mg at 07/02/19 2157  . diphenhydrAMINE (BENADRYL) injection 12.5 mg  12.5 mg Intravenous BID PRN Gouru, Aruna, MD      . enoxaparin (LOVENOX) injection 40 mg  40 mg Subcutaneous Q24H Pyreddy, Reatha Harps, MD   40 mg at 07/04/19 2148  . FLUoxetine (PROZAC) capsule 40 mg  40 mg Oral Daily Concha Se, MD   40 mg at 07/03/19 1058  . LORazepam (ATIVAN) injection 0.5 mg  0.5 mg Intravenous BID Gouru, Aruna, MD   0.5 mg at 07/04/19 2148  . metoprolol tartrate (LOPRESSOR) injection 5 mg  5 mg Intravenous Q4H PRN Gouru, Aruna, MD   5 mg at 07/04/19 1245  . OLANZapine (ZYPREXA) tablet 10 mg  10 mg Oral QHS Concha Se, MD   10 mg at 07/03/19 2026  . ondansetron (ZOFRAN) tablet 4 mg  4 mg Oral Q6H PRN Pyreddy, Vivien Rota, MD       Or  . ondansetron (ZOFRAN) injection 4 mg  4 mg Intravenous Q6H PRN Pyreddy, Pavan, MD      . pantoprazole (PROTONIX) EC tablet 40 mg  40 mg Oral Daily Concha Se,  MD   40 mg at 07/03/19 1058  . senna-docusate (Senokot-S) tablet 1 tablet  1 tablet Oral QHS PRN Ihor Austin, MD      . traZODone (DESYREL) tablet 100 mg  100 mg Oral QHS Concha Se, MD   100 mg at 07/03/19 2025  . Valbenazine Tosylate CAPS 80 mg  80 mg Oral QHS Concha Se, MD   80 mg at 07/03/19 2344  . vitamin B-12 (CYANOCOBALAMIN) tablet 1,000 mcg  1,000 mcg Oral Daily Concha Se, MD   Stopped at 07/02/19 1110     Discharge Medications: Please see discharge summary for a list of discharge medications.  Relevant Imaging Results:  Relevant Lab Results:   Additional Information SS#: [id number]  Margarito Liner, LCSW

## 2019-07-05 NOTE — Progress Notes (Signed)
Troy Fox at [place] NAME: Troy Fox    MR#:  [id number]  DATE OF BIRTH:  1971/04/06  SUBJECTIVE:  Patient lying on the bed Nonverbal  REVIEW OF SYSTEMS:  CONSTITUTIONAL: No fever, fatigue or weakness.  EYES: No blurred or double vision.  EARS, NOSE, AND THROAT: No tinnitus or ear pain.  RESPIRATORY: No cough, shortness of breath, wheezing or hemoptysis.  CARDIOVASCULAR: No chest pain, orthopnea, edema.  GASTROINTESTINAL: No nausea, vomiting, diarrhea or abdominal pain.  GENITOURINARY: No dysuria, hematuria.  ENDOCRINE: No polyuria, nocturia,  HEMATOLOGY: No anemia, easy bruising or bleeding SKIN: No rash or lesion. MUSCULOSKELETAL: No joint pain or arthritis.   NEUROLOGIC: No tingling, numbness, weakness.  PSYCHIATRY: No anxiety or depression.   DRUG ALLERGIES:   Allergies  Allergen Reactions  . Aripiprazole Other (See Comments)    Causes gambling addiction  *abilify     VITALS:  Blood pressure 137/68, pulse (!) 112, temperature 98.1 F (36.7 C), temperature source Oral, resp. rate 18, height 5\' 5"  (1.651 m), weight 65.8 kg, SpO2 98 %.  PHYSICAL EXAMINATION:  GENERAL:  48 y.o.-year-old patient lying in the bed with no acute distress.  EYES: Pupils equal, round, reactive to light and accommodation. No scleral icterus. Extraocular muscles intact.  HEENT: Head atraumatic, normocephalic. Oropharynx and nasopharynx clear.  NECK:  Supple, no jugular venous distention. No thyroid enlargement, no tenderness.  LUNGS: Normal breath sounds bilaterally, no wheezing, rales,rhonchi or crepitation. No use of accessory muscles of respiration.  CARDIOVASCULAR: S1, S2 normal. No murmurs, rubs, or gallops.  ABDOMEN: Soft, nontender, nondistended. Bowel sounds present. No organomegaly or mass.  EXTREMITIES: No pedal edema, cyanosis, or clubbing.  NEUROLOGIC: Cranial nerves II through XII are intact. Muscle strength 5/5 in all  extremities. Sensation intact. Gait not checked.  PSYCHIATRIC: The patient is alert and oriented x 3.  SKIN: No obvious rash, lesion, or ulcer.    LABORATORY PANEL:   CBC Recent Labs  Lab 07/04/19 0328  WBC 4.2  HGB 14.6  HCT 42.2  PLT 148*   ------------------------------------------------------------------------------------------------------------------  Chemistries  Recent Labs  Lab 07/03/19 0844 07/04/19 0328  NA 143 144  K 3.8 3.7  CL 106 106  CO2 21* 23  GLUCOSE 92 87  BUN 11 6  CREATININE 0.77 0.72  CALCIUM 9.2 9.3  AST 29  --   ALT 32  --   ALKPHOS 46  --   BILITOT 1.3*  --    ------------------------------------------------------------------------------------------------------------------  Cardiac Enzymes No results for input(s): TROPONINI in the last 168 hours. ------------------------------------------------------------------------------------------------------------------  RADIOLOGY:  Dg Abd 1 View  Result Date: 07/04/2019 CLINICAL DATA:  Evaluate for residual barium in the intestines. EXAM: ABDOMEN - 1 VIEW COMPARISON:  CT 04/27/2019.  Upper GI 06/29/2019 FINDINGS: Oral contrast material is seen throughout the colon, including within diverticula. No small bowel contrast. Gas throughout nondistended large and small bowel. No free air organomegaly. IMPRESSION: Oral contrast material throughout the colon and within colonic diverticula. Electronically Signed   By: Troy Fox M.D.   On: 07/04/2019 10:33   Ct No Charge  Result Date: 07/03/2019 CLINICAL DATA:  Unexplained dysphasia EXAM: CT ADDITIONAL VIEWS AT NO CHARGE TECHNIQUE: CT topogram CONTRAST:  None COMPARISON:  None FINDINGS: CT topogram demonstrates large volume of oral barium contrast within the RIGHT and colon and particularly the rectum. high-density barium can degrade diagnostic CT. Recommend waiting for barium to clear colon. IMPRESSION: Repeat KUB prior to CT scan  to demonstrate clearance of  barium from colon. Electronically Signed   By: Troy Fox M.D.   On: 07/03/2019 16:06    EKG:   Orders placed or performed during the hospital encounter of 07/01/19  . ED EKG  . ED EKG  . ED EKG  . ED EKG    ASSESSMENT AND PLAN:    48 year old male patient with a known history of bipolar disorder, catatonic schizophrenia, GERD, schizoaffective disorder, tardive dyskinesia presented to the emergency room for difficulty swallowing and currently under hospitalist service.  -Dysphagia Could be from underlying psychiatric disorder IV fluids for hydration Clear liquid as tolerated Gastroenterology -Troy Fox has seen the patient Recent barium swallow shows normal esophagus, paraesophageal hernia ENT consulted and Dr. Willeen Fox is aware of the consult planning to do a bedside scope if patient is cooperating, family are aware of the plan Follow-up with ENT  -Catatonic schizophrenia Psychiatry consult, Dr. Cindi Fox is following collaborating with the previous psychiatrist to see if ECT is an option Recommending to resume Prozac and olanzapine, but patient is not swallowing, discussed with psychiatry Troy Fox Supportive care   -GERD Oral PPI  -DVT prophylaxis subcu Lovenox daily  -Dehydration IV fluids  -Ambulatory dysfunction Physical therapy evaluation, x-ray of the lumbar thoracic spine Patient ambulates at home with the help of walker as endorsed by the dad  -Adult failure to thrive Supportive care  All the records are reviewed and case discussed with Care Management/Social Workerr. Management plans discussed with the patient, patients dad -  and they are in agreement.  CODE STATUS: fc   TOTAL TIME TAKING CARE OF THIS PATIENT: 35  minutes.   POSSIBLE D/C IN 2-3  DAYS, DEPENDING ON CLINICAL CONDITION.  Note: This dictation was prepared with Dragon dictation along with smaller phrase technology. Any transcriptional errors that result from this process  are unintentional.   Troy Fox M.D on 07/05/2019 at 12:20 PM  Between 7am to 6pm - Pager - [phone] After 6pm go to [email or web address] - password [email or web address] [facility]  [place] [facility]  Office  [phone]  CC: Primary care physician; Benita Stabile, MD

## 2019-07-05 NOTE — Progress Notes (Addendum)
Reached out to dietitian to consult concern for lack of intake over admission and possible alternative needs of feeding. Pt has not been able to much of anything and unable to manage drool in his mouth. Writer has to suction mouth to clear drool. If pt had alternative way of feeding pt could get psych meds continuously. Dietitian states she will review chart speak with MD and touch base with nurse again.

## 2019-07-05 NOTE — Progress Notes (Signed)
The patient has gone through a full blood work-up without any structural cause for his dysphagia.  Nothing further to do from a GI point of view except if alternative feeding method such as a PEG tube is required.  If the PEG tube is needed reconsult [place].  I will sign off.  Please call if any further GI concerns or questions.  We would like to thank you for the opportunity to participate in the care of Troy Fox.

## 2019-07-05 NOTE — Progress Notes (Signed)
Called father and updated on ECT and NGT placement. All questions answered informed father that son was requesting a visit from him.

## 2019-07-05 NOTE — Progress Notes (Signed)
pt was only able to swallow 1 20mg  tab ( see MAR) . the other crushed  pills he held in his mouth and begain coughing had to suction

## 2019-07-06 ENCOUNTER — Inpatient Hospital Stay: Payer: Medicare Other | Admitting: Anesthesiology

## 2019-07-06 ENCOUNTER — Inpatient Hospital Stay: Payer: Medicare Other

## 2019-07-06 DIAGNOSIS — F251 Schizoaffective disorder, depressive type: Secondary | ICD-10-CM | POA: Diagnosis not present

## 2019-07-06 LAB — BASIC METABOLIC PANEL
Anion gap: 13 (ref 5–15)
BUN: 10 mg/dL (ref 6–20)
CO2: 22 mmol/L (ref 22–32)
Calcium: 9.3 mg/dL (ref 8.9–10.3)
Chloride: 108 mmol/L (ref 98–111)
Creatinine, Ser: 0.56 mg/dL — ABNORMAL LOW (ref 0.61–1.24)
GFR calc Af Amer: >60 mL/min (ref >60)
GFR calc non Af Amer: >60 mL/min (ref >60)
Glucose, Bld: 76 mg/dL (ref 70–99)
Potassium: 3.7 mmol/L (ref 3.5–5.1)
Sodium: 143 mmol/L (ref 135–145)

## 2019-07-06 MED ORDER — METOPROLOL TARTRATE 5 MG/5ML IV SOLN
5.0000 mg | INTRAVENOUS | Status: DC | PRN
  Administered 2019-07-06 – 2019-07-13 (×7): 5 mg via INTRAVENOUS
  Filled 2019-07-06 (×7): qty 5

## 2019-07-06 MED ORDER — SUCCINYLCHOLINE CHLORIDE 200 MG/10ML IV SOSY
PREFILLED_SYRINGE | INTRAVENOUS | Status: DC | PRN
  Administered 2019-07-06: 90 mg via INTRAVENOUS

## 2019-07-06 MED ORDER — MIDAZOLAM HCL 2 MG/2ML IJ SOLN
INTRAMUSCULAR | Status: AC
  Filled 2019-07-06: qty 2

## 2019-07-06 MED ORDER — KETAMINE HCL 10 MG/ML IJ SOLN
INTRAMUSCULAR | Status: DC | PRN
  Administered 2019-07-06: 80 mg via INTRAVENOUS

## 2019-07-06 MED ORDER — MIDAZOLAM HCL 2 MG/2ML IJ SOLN
INTRAMUSCULAR | Status: DC | PRN
  Administered 2019-07-06: 2 mg via INTRAVENOUS

## 2019-07-06 MED ORDER — KETAMINE HCL 50 MG/ML IJ SOLN
INTRAMUSCULAR | Status: AC
  Filled 2019-07-06: qty 10

## 2019-07-06 MED ORDER — SENNOSIDES-DOCUSATE SODIUM 8.6-50 MG PO TABS
1.0000 | ORAL_TABLET | Freq: Every day | ORAL | Status: DC
  Administered 2019-07-06 – 2019-07-08 (×3): 1 via ORAL
  Filled 2019-07-06 (×3): qty 1

## 2019-07-06 NOTE — Transfer of Care (Signed)
Immediate Anesthesia Transfer of Care Note  Patient: Troy Fox  Procedure(s) Performed: ECT TX  Patient Location: PACU  Anesthesia Type:General  Level of Consciousness: sedated  Airway & Oxygen Therapy: Patient Spontanous Breathing and Patient connected to face mask oxygen  Post-op Assessment: Report given to RN and Post -op Vital signs reviewed and stable  Post vital signs: Reviewed and stable  Last Vitals:  Vitals Value Taken Time  BP 158/96 07/06/19 1050  Temp    Pulse 108 07/06/19 1053  Resp 15 07/06/19 1053  SpO2 100 % 07/06/19 1053  Vitals shown include unvalidated device data.  Last Pain:  Vitals:   07/06/19 1051  TempSrc:   PainSc: (P) Asleep         Complications: No apparent anesthesia complications

## 2019-07-06 NOTE — Plan of Care (Signed)
Pt alert to self and place, follow commands.  Pt had ECT today.  Metoprolol given x2 for tachycardia with improvement.  Rest of VSS.  NG tube placed.

## 2019-07-06 NOTE — Anesthesia Postprocedure Evaluation (Signed)
Anesthesia Post Note  Patient: Troy Fox  Procedure(s) Performed: ECT TX  Patient location during evaluation: PACU Anesthesia Type: General Level of consciousness: awake and alert and oriented Pain management: pain level controlled Vital Signs Assessment: post-procedure vital signs reviewed and stable Respiratory status: spontaneous breathing, nonlabored ventilation and respiratory function stable Cardiovascular status: blood pressure returned to baseline and stable Postop Assessment: no signs of nausea or vomiting Anesthetic complications: no     Last Vitals:  Vitals:   07/06/19 1131 07/06/19 1206  BP: (!) 148/96 (!) 131/91  Pulse: (!) 106 (!) 105  Resp: (!) 23 18  Temp: 37.3 C 36.9 C  SpO2: 96% 97%    Last Pain:  Vitals:   07/06/19 1206  TempSrc: Oral  PainSc:                  [name]

## 2019-07-06 NOTE — Care Management Important Message (Signed)
Important Message  Patient Details  Name: Troy Fox MRN: [id number] Date of Birth: [birth year]   Medicare Important Message Given:  Yes     Juliann Pulse A Febe Champa 07/06/2019, 11:02 AM

## 2019-07-06 NOTE — Anesthesia Procedure Notes (Signed)
Performed by: Ladarien Beeks, CRNA Pre-anesthesia Checklist: Patient identified, Emergency Drugs available, Suction available and Patient being monitored Patient Re-evaluated:Patient Re-evaluated prior to induction Oxygen Delivery Method: Circle system utilized Preoxygenation: Pre-oxygenation with 100% oxygen Induction Type: IV induction Ventilation: Mask ventilation without difficulty and Mask ventilation throughout procedure Airway Equipment and Method: Bite block Placement Confirmation: positive ETCO2 Dental Injury: Teeth and Oropharynx as per pre-operative assessment        

## 2019-07-06 NOTE — Anesthesia Preprocedure Evaluation (Signed)
Anesthesia Evaluation  Patient identified by MRN, date of birth, ID band Patient awake  General Assessment Comment:Review limited to chart review, limited verbal communication  Reviewed: Allergy & Precautions, NPO status , Patient's Chart, lab work & pertinent test results  History of Anesthesia Complications Negative for: history of anesthetic complications  Airway Mallampati: II  TM Distance: >3 FB Neck ROM: Full    Dental  (+) Poor Dentition   Pulmonary neg pulmonary ROS, neg sleep apnea, neg COPD,    breath sounds clear to auscultation- rhonchi (-) wheezing      Cardiovascular (-) hypertension(-) CAD, (-) Past MI, (-) Cardiac Stents and (-) CABG  Rhythm:Regular Rate:Normal - Systolic murmurs and - Diastolic murmurs    Neuro/Psych neg Seizures PSYCHIATRIC DISORDERS Depression Bipolar Disorder Schizophrenia negative neurological ROS     GI/Hepatic Neg liver ROS, hiatal hernia, GERD  ,  Endo/Other  negative endocrine ROSneg diabetes  Renal/GU negative Renal ROS     Musculoskeletal negative musculoskeletal ROS (+)   Abdominal (+) - obese,   Peds  Hematology negative hematology ROS (+)   Anesthesia Other Findings Past Medical History: No date: Bipolar 1 disorder (HCC) No date: 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HCC) No date: GERD (gastroesophageal reflux disease) No date: Nonverbal No date: Schizoaffective disorder (HCC) No date: Tardive dyskinesia   Reproductive/Obstetrics                             Anesthesia Physical  Anesthesia Plan  ASA: II  Anesthesia Plan: General   Post-op Pain Management:    Induction: Intravenous  PONV Risk Score and Plan: 1 and Ondansetron  Airway Management Planned: Mask  Additional Equipment:   Intra-op Plan:   Post-operative Plan:   Informed Consent: I have reviewed the patients History and Physical, chart, labs and discussed the procedure including  the risks, benefits and alternatives for the proposed anesthesia with the patient or authorized representative who has indicated his/her understanding and acceptance.     Dental advisory given  Plan Discussed with: CRNA and Anesthesiologist  Anesthesia Plan Comments:         Anesthesia Quick Evaluation

## 2019-07-06 NOTE — Procedures (Signed)
ECT SERVICES Physician's Interval Evaluation & Treatment Note  Patient Identification: Troy Fox MRN:  [id number] Date of Evaluation:  07/06/2019 TX #: 1  MADRS:   MMSE:   P.E. Findings:  Patient is withdrawn flat wasted clearly has been losing weight no specific findings he does complain of back pain  Psychiatric Interval Note:  Withdrawn not quite catatonic but very flat and depressed  Subjective:  Patient is a 48 y.o. male seen for evaluation for Electroconvulsive Therapy. Mostly complaining of physical pain  Treatment Summary:   []   Right Unilateral             [x]  Bilateral   % Energy : 1.0 ms, 100%   Impedance: 1390 ohms  Seizure Energy Index: 18,203 V squared  Postictal Suppression Index: 88%  Seizure Concordance Index: 91%  Medications  Pre Shock: Ketamine 80 mg succinylcholine 90 mg  Post Shock: Versed 2 mg  Seizure Duration: 9 seconds by EMG 13 seconds EEG   Comments: Inadequate seizure.  I will review his medicines to see if there is any change that can be made to improve the quality of seizure.  Another option might be going to remifentanil but will wait till next treatment on Monday to decide  Lungs:  [x]   Clear to auscultation               []  Other:   Heart:    [x]   Regular rhythm             []  irregular rhythm    [x]   Previous H&P reviewed, patient examined and there are NO CHANGES                 []   Previous H&P reviewed, patient examined and there are changes noted.   Alethia Berthold, MD 10/23/202010:44 AM

## 2019-07-06 NOTE — Progress Notes (Signed)
Notified Dr Estanislado Pandy of abdomen Xray results for verification of NG tube placement.  Per MD ok to use NG tube.

## 2019-07-06 NOTE — TOC Progression Note (Signed)
Transition of Care [facility]) - Progression Note    Patient Details  Name: Troy Fox MRN: [id number] Date of Birth: [birth year]  Transition of Care [facility]) CM/SW [place], LCSW Phone Number: 07/06/2019, 12:31 PM  Clinical Narrative: Gwendalyn Ege is under level 2 review. No bed offers at this time.  Expected Discharge Plan: [place]    Expected Discharge Plan and Services Expected Discharge Plan: [place] arrangements for the past 2 months: Single Family Home                                       Social Determinants of Health (SDOH) Interventions    Readmission Risk Interventions No flowsheet data found.

## 2019-07-06 NOTE — Progress Notes (Signed)
[facility] [place] [facility]   PATIENT NAME: Troy Fox    MR#:  [id number]  DATE OF BIRTH:  1971-03-22  SUBJECTIVE:  Patient lying on the bed Verbally communicating today Had ECT therapy first session by psychiatry today  REVIEW OF SYSTEMS:  CONSTITUTIONAL: No fever, fatigue or weakness.  EYES: No blurred or double vision.  EARS, NOSE, AND THROAT: No tinnitus or ear pain.  RESPIRATORY: No cough, shortness of breath, wheezing or hemoptysis.  CARDIOVASCULAR: No chest pain, orthopnea, edema.  GASTROINTESTINAL: No nausea, vomiting, diarrhea or abdominal pain.  GENITOURINARY: No dysuria, hematuria.  ENDOCRINE: No polyuria, nocturia,  HEMATOLOGY: No anemia, easy bruising or bleeding SKIN: No rash or lesion. MUSCULOSKELETAL: No joint pain or arthritis.   NEUROLOGIC: No tingling, numbness, weakness.  PSYCHIATRY: No anxiety or depression.   DRUG ALLERGIES:   Allergies  Allergen Reactions  . Aripiprazole Other (See Comments)    Causes gambling addiction  *abilify     VITALS:  Blood pressure (!) 149/98, pulse (!) 119, temperature 98.1 F (36.7 C), temperature source Oral, resp. rate 18, height 5\' 5"  (1.651 m), weight 65.8 kg, SpO2 97 %.  PHYSICAL EXAMINATION:  GENERAL:  48 y.o.-year-old patient lying in the bed with no acute distress.  EYES: Pupils equal, round, reactive to light and accommodation. No scleral icterus. Extraocular muscles intact.  HEENT: Head atraumatic, normocephalic. Oropharynx and nasopharynx clear.  NECK:  Supple, no jugular venous distention. No thyroid enlargement, no tenderness.  LUNGS: Normal breath sounds bilaterally, no wheezing, rales,rhonchi or crepitation. No use of accessory muscles of respiration.  CARDIOVASCULAR: S1, S2 normal. No murmurs, rubs, or gallops.  ABDOMEN: Soft, nontender, nondistended. Bowel sounds present. No organomegaly or mass.  EXTREMITIES: No pedal edema, cyanosis, or clubbing.  NEUROLOGIC:  Cranial nerves II through XII are intact. Muscle strength 5/5 in all extremities. Sensation intact. Gait not checked.  PSYCHIATRIC: The patient is alert and oriented x1.  SKIN: No obvious rash, lesion, or ulcer.    LABORATORY PANEL:   CBC Recent Labs  Lab 07/04/19 0328  WBC 4.2  HGB 14.6  HCT 42.2  PLT 148*   ------------------------------------------------------------------------------------------------------------------  Chemistries  Recent Labs  Lab 07/03/19 0844  07/06/19 0514  NA 143   < > 143  K 3.8   < > 3.7  CL 106   < > 108  CO2 21*   < > 22  GLUCOSE 92   < > 76  BUN 11   < > 10  CREATININE 0.77   < > 0.56*  CALCIUM 9.2   < > 9.3  AST 29  --   --   ALT 32  --   --   ALKPHOS 46  --   --   BILITOT 1.3*  --   --    < > = values in this interval not displayed.   ------------------------------------------------------------------------------------------------------------------  Cardiac Enzymes No results for input(s): TROPONINI in the last 168 hours. ------------------------------------------------------------------------------------------------------------------  RADIOLOGY:  No results found.  EKG:   Orders placed or performed during the hospital encounter of 07/01/19  . ED EKG  . ED EKG  . ED EKG  . ED EKG    ASSESSMENT AND PLAN:    48 year old male patient with a known history of bipolar disorder, catatonic schizophrenia, GERD, schizoaffective disorder, tardive dyskinesia presented to the emergency room for difficulty swallowing and currently under hospitalist service.  -Dysphagia Could be from underlying psychiatric disorder IV fluids for hydration Clear liquid  as tolerated Gastroenterology -Dr. Allen Norris has seen the patient no further recommendations as per GI unless feeding tube / peg decided Recent barium swallow shows normal esophagus, paraesophageal hernia ENT consulted and no acute recommendation, throat cultures no growth so far We will  try placement of NG tube for feeding manually and if not successful radiology to guide placement  -Catatonic schizophrenia Appreciate psychiatry follow-up Electroconvulsive therapy started  resumed Prozac and olanzapine, we have to monitor how much patient will swallow Supportive care   -GERD Oral PPI  -DVT prophylaxis subcu Lovenox daily  -Dehydration IV fluids  -Ambulatory dysfunction Physical therapy evaluation appreciated Patient ambulates at home with the help of walker   -Adult failure to thrive Supportive care  All the records are reviewed and case discussed with Care Management/Social Workerr. Management plans discussed with the patient, patients dad -  and they are in agreement.  CODE STATUS: fc   TOTAL TIME TAKING CARE OF THIS PATIENT: 38  minutes.   POSSIBLE D/C IN 2-3  DAYS, DEPENDING ON CLINICAL CONDITION.  Note: This dictation was prepared with Dragon dictation along with smaller phrase technology. Any transcriptional errors that result from this process are unintentional.   Saundra Shelling M.D on 07/06/2019 at 10:40 AM  Between 7am to 6pm - Pager - [phone] After 6pm go to [email or web address] - password [email or web address] [place] [facility]  Office  [phone]  CC: Primary care physician; Celene Squibb, MD

## 2019-07-06 NOTE — Progress Notes (Signed)
PT Cancellation Note  Patient Details Name: Huey Scalia MRN: [id number] DOB: 08-31-1971   Cancelled Treatment:    Reason Eval/Treat Not Completed: Patient at procedure or test/unavailable.  Pt currently off floor and not available for PT therapy session.  Will re-attempt PT treatment session at a later date/time.   Leitha Bleak, PT 07/06/19, 11:15 AM [phone]

## 2019-07-06 NOTE — H&P (Signed)
Troy Fox is an 48 y.o. male.   Chief Complaint: back pain. Withdrawn and very depressed HPI: schizoaffective disorder depressive  Past Medical History:  Diagnosis Date  . Bipolar 1 disorder ([place])   . 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 GERD (gastroesophageal reflux disease)   . Nonverbal   . Schizoaffective disorder ([place])   . Tardive dyskinesia     Past Surgical History:  Procedure Laterality Date  . BIOPSY  06/15/2019   Procedure: BIOPSY;  Surgeon: Rogene Houston, MD;  [facility]: [facility];  Service: Endoscopy;;  gastric  . ESOPHAGOGASTRODUODENOSCOPY (EGD) WITH PROPOFOL N/A 06/15/2019   Procedure: ESOPHAGOGASTRODUODENOSCOPY (EGD) WITH PROPOFOL;  Surgeon: Rogene Houston, MD;  Location: [facility];  Service: Endoscopy;  Laterality: N/A;  7:30    Family History  Adopted: Yes  Family history unknown: Yes   Social History:  reports that he has never smoked. He has never used smokeless tobacco. He reports that he does not drink alcohol or use drugs.  Allergies:  Allergies  Allergen Reactions  . Aripiprazole Other (See Comments)    Causes gambling addiction  *abilify     Medications Prior to Admission  Medication Sig Dispense Refill  . acetaminophen (TYLENOL) 500 MG tablet Take 1,000 mg by mouth every 8 (eight) hours as needed for moderate pain.    Marland Kitchen alum & mag hydroxide-simeth (MYLANTA) 242-683-41 MG/5ML suspension Take 30 mLs by mouth every 6 (six) hours as needed for indigestion or heartburn.    . cyanocobalamin 1000 MCG tablet Take 1 tablet (1,000 mcg total) by mouth daily. 30 tablet 1  . dicyclomine (BENTYL) 20 MG tablet Take 1 tablet (20 mg total) by mouth 2 (two) times daily as needed for spasms. 20 tablet 0  . FLUoxetine (PROZAC) 40 MG capsule TAKE 1 CAPSULE BY MOUTH DAILY. (Patient taking differently: Take 40 mg by mouth daily. ) 30 capsule 0  . OLANZapine (ZYPREXA) 10 MG tablet Take 10 mg by mouth at bedtime.     Marland Kitchen omeprazole (PRILOSEC) 40 MG capsule Take 40  mg by mouth daily.    . traZODone (DESYREL) 100 MG tablet Take 1 tablet (100 mg total) by mouth at bedtime. 30 tablet 1  . Valbenazine Tosylate (INGREZZA) 80 MG CAPS Take 30 mg by mouth daily. (Patient taking differently: Take 80 mg by mouth daily. ) 30 capsule 0    Results for orders placed or performed during the hospital encounter of 07/01/19 (from the past 48 hour(s))  Culture, group A strep     Status: None (Preliminary result)   Collection Time: 07/04/19  5:30 PM   Specimen: Throat  Result Value Ref Range   Specimen Description      THROAT Performed at [facility], [address], [place], [place] [address]    Special Requests      NONE Performed at [facility], [address], [place], [place] [address]    Culture      TOO YOUNG TO READ Performed at [facility], [place] [address], [place], [place] [address]    Report Status PENDING   Basic metabolic panel     Status: Abnormal   Collection Time: 07/06/19  5:14 AM  Result Value Ref Range   Sodium 143 135 - 145 mmol/L   Potassium 3.7 3.5 - 5.1 mmol/L   Chloride 108 98 - 111 mmol/L   CO2 22 22 - 32 mmol/L   Glucose, Bld 76 70 - 99 mg/dL  BUN 10 6 - 20 mg/dL   Creatinine, Ser 1.01 (L) 0.61 - 1.24 mg/dL   Calcium 9.3 8.9 - 75.1 mg/dL   GFR calc non Af Amer >60 >60 mL/min   GFR calc Af Amer >60 >60 mL/min   Anion gap 13 5 - 15    Comment: Performed at [facility], [address] [place], [place] [address]   Dg Abd 1 View  Result Date: 07/04/2019 CLINICAL DATA:  Evaluate for residual barium in the intestines. EXAM: ABDOMEN - 1 VIEW COMPARISON:  CT 04/27/2019.  Upper GI 06/29/2019 FINDINGS: Oral contrast material is seen throughout the colon, including within diverticula. No small bowel contrast. Gas throughout nondistended large and small bowel. No free air organomegaly. IMPRESSION: Oral contrast material throughout the colon and within colonic diverticula. Electronically Signed   By:  Charlett Nose M.D.   On: 07/04/2019 10:33    Review of Systems  Unable to perform ROS: Psychiatric disorder  Musculoskeletal: Positive for back pain.    Blood pressure (!) 149/98, pulse (!) 119, temperature 98.1 F (36.7 C), temperature source Oral, resp. rate 18, height 5\' 5"  (1.651 m), weight 65.8 kg, SpO2 97 %. Physical Exam  Nursing note and vitals reviewed. Constitutional: He appears well-developed.  HENT:  Head: Normocephalic and atraumatic.  Eyes: Pupils are equal, round, and reactive to light. Conjunctivae are normal.  Neck: Normal range of motion.  Cardiovascular: Regular rhythm and normal heart sounds.  Respiratory: Effort normal.  GI: Soft.  Musculoskeletal: Normal range of motion.  Neurological: He is alert.  Skin: Skin is warm and dry.  Psychiatric: His affect is blunt. He is slowed and withdrawn. He is noncommunicative.     Assessment/Plan tx today and then hope for follow up Monday  Wednesday, MD 07/06/2019, 10:12 AM

## 2019-07-06 NOTE — Progress Notes (Signed)
Nutrition Brief Follow Up Note   INTERVENTION:   Once NGT tube in place, recommend:  Jevity 1.5 @45ml /hr- Initiate at 16ml/hr and increase by 37ml/hr q12 hours until goal rate is reached   Prostat liquid protein 30 ml daily via tube, each supplement provides 100 kcal, 15 grams protein.  Free water flushes 131ml q4 hours  Regimen provides 1720kcal/day, 84g/day protein, 23g/day fiber, 1782ml/day free water   Pt is at high refeed risk; recommend monitor K, Mg and P labs daily   Estimated Nutritional Needs:   Kcal:  1600-1800kcal/day  Protein:  80-90g/day  Fluid:  >1.8L/day  Koleen Distance MS, RD, LDN Pager #- [phone] Office#- [phone] After Hours Pager: [phone]

## 2019-07-06 NOTE — Anesthesia Post-op Follow-up Note (Signed)
Anesthesia QCDR form completed.        

## 2019-07-07 ENCOUNTER — Inpatient Hospital Stay: Payer: Medicare Other

## 2019-07-07 LAB — CULTURE, GROUP A STREP (THRC)

## 2019-07-07 LAB — GLUCOSE, CAPILLARY: Glucose-Capillary: 72 mg/dL (ref 70–99)

## 2019-07-07 MED ORDER — POLYETHYLENE GLYCOL 3350 17 G PO PACK
17.0000 g | PACK | Freq: Every day | ORAL | Status: DC | PRN
  Administered 2019-07-07 – 2019-07-09 (×3): 17 g via NASOGASTRIC
  Filled 2019-07-07 (×3): qty 1

## 2019-07-07 MED ORDER — SODIUM CHLORIDE 0.9% FLUSH
3.0000 mL | INTRAVENOUS | Status: DC | PRN
  Administered 2019-07-07 – 2019-07-08 (×3): 3 mL via INTRAVENOUS
  Filled 2019-07-07 (×3): qty 3

## 2019-07-07 MED ORDER — SODIUM CHLORIDE 0.9% FLUSH
3.0000 mL | Freq: Two times a day (BID) | INTRAVENOUS | Status: DC
  Administered 2019-07-07 – 2019-07-18 (×20): 3 mL via INTRAVENOUS

## 2019-07-07 NOTE — Progress Notes (Signed)
Dr Sidney Ace made aware that pt pulled out NG tube, asked MD if he wanted me to reinsert and get and xray for placement, per Dr Sidney Ace have morning shift insert and get xray

## 2019-07-07 NOTE — Progress Notes (Addendum)
Pt drank 1 serving of nectar thick water independently with slow swallow; no coughing. Assisted with 2nd cup. Drooling at intervals. Other liquids/meds given per NG.

## 2019-07-07 NOTE — Progress Notes (Signed)
[facility] - [place] at [facility]   PATIENT NAME: Troy Fox    MR#:  [id number]  DATE OF BIRTH:  [birth year]  SUBJECTIVE:  patient awake trying to pull out mitts NG tube replaced. Patient is asking why his mittens are placed Verbally communicating today Had ECT therapy first session by psychiatry on 10/23  REVIEW OF SYSTEMS:  CONSTITUTIONAL: No fever, fatigue or weakness.  EYES: No blurred or double vision.  EARS, NOSE, AND THROAT: No tinnitus or ear pain.  RESPIRATORY: No cough, shortness of breath, wheezing or hemoptysis.  CARDIOVASCULAR: No chest pain, orthopnea, edema.  GASTROINTESTINAL: No nausea, vomiting, diarrhea or abdominal pain.  GENITOURINARY: No dysuria, hematuria.  ENDOCRINE: No polyuria, nocturia,  HEMATOLOGY: No anemia, easy bruising or bleeding SKIN: No rash or lesion. MUSCULOSKELETAL: No joint pain or arthritis.   NEUROLOGIC: No tingling, numbness, weakness.  PSYCHIATRY: No anxiety or depression.   DRUG ALLERGIES:   Allergies  Allergen Reactions  . Aripiprazole Other (See Comments)    Causes gambling addiction  *abilify     VITALS:  Blood pressure 128/76, pulse 94, temperature 98.7 F (37.1 C), temperature source Oral, resp. rate 18, height 5\' 5"  (1.651 m), weight 65.8 kg, SpO2 98 %.  PHYSICAL EXAMINATION:  GENERAL:  48 y.o.-year-old patient lying in the bed with no acute distress.  EYES: Pupils equal, round, reactive to light and accommodation. No scleral icterus. Extraocular muscles intact.  HEENT: Head atraumatic, normocephalic. Oropharynx and nasopharynx clear. NG+ NECK:  Supple, no jugular venous distention. No thyroid enlargement, no tenderness.  LUNGS: Normal breath sounds bilaterally, no wheezing, rales,rhonchi or crepitation. No use of accessory muscles of respiration.  CARDIOVASCULAR: S1, S2 normal. No murmurs, rubs, or gallops.  ABDOMEN: Soft, nontender, nondistended. Bowel sounds present. No organomegaly or  mass.  EXTREMITIES: No pedal edema, cyanosis, or clubbing.  NEUROLOGIC: Cranial nerves II through XII are intact. Muscle strength 5/5 in all extremities. Sensation intact. Gait not checked.  PSYCHIATRIC: The patient is alert and oriented x1.  SKIN: No obvious rash, lesion, or ulcer.    LABORATORY PANEL:   CBC Recent Labs  Lab 07/04/19 0328  WBC 4.2  HGB 14.6  HCT 42.2  PLT 148*   ------------------------------------------------------------------------------------------------------------------  Chemistries  Recent Labs  Lab 07/03/19 0844  07/06/19 0514  NA 143   < > 143  K 3.8   < > 3.7  CL 106   < > 108  CO2 21*   < > 22  GLUCOSE 92   < > 76  BUN 11   < > 10  CREATININE 0.77   < > 0.56*  CALCIUM 9.2   < > 9.3  AST 29  --   --   ALT 32  --   --   ALKPHOS 46  --   --   BILITOT 1.3*  --   --    < > = values in this interval not displayed.   ------------------------------------------------------------------------------------------------------------------  Cardiac Enzymes No results for input(s): TROPONINI in the last 168 hours. ------------------------------------------------------------------------------------------------------------------  RADIOLOGY:  Dg Abd 1 View  Result Date: 07/07/2019 CLINICAL DATA:  Enteric tube placement EXAM: ABDOMEN - 1 VIEW COMPARISON:  07/06/2019 abdominal radiograph FINDINGS: Enteric tube terminates in the gastric fundus. No dilated upper abdominal small bowel loops. Moderate colonic gas and stool in the visualized upper abdomen, with no evidence of pneumatosis or pneumoperitoneum. Small nodular opacity in the periphery of the right mid lung. IMPRESSION: Enteric tube terminates in the gastric  fundus. Nonobstructive bowel gas pattern. Small nodular opacity in the periphery of the right mid lung, suggest chest CT evaluation when clinically feasible to exclude pulmonary nodule. Electronically Signed   By: Delbert PhenixJason A Poff M.D.   On: 07/07/2019  10:08   Dg Abd 1 View  Result Date: 07/06/2019 CLINICAL DATA:  Nasogastric tube placement EXAM: ABDOMEN - 1 VIEW COMPARISON:  July 06, 2019 study obtained earlier in the day FINDINGS: Nasogastric tube tip and side port are now well within the stomach. There is contrast in the colon with multiple contrast filled diverticula. There is no bowel dilatation or air-fluid level to suggest bowel obstruction. No free air. Lung bases clear. IMPRESSION: Nasogastric tube tip and side port now within the stomach. Multiple colonic diverticula present. No bowel obstruction or free air evident. Lung bases clear. Electronically Signed   By: Bretta BangWilliam  Woodruff III M.D.   On: 07/06/2019 17:39   Dg Abd 1 View  Result Date: 07/06/2019 CLINICAL DATA:  Nasogastric tube placement EXAM: ABDOMEN - 1 VIEW COMPARISON:  Portable exam 1556 hours compared to earlier study of 14/2 hours FINDINGS: Tip of nasogastric tube projects over stomach though the proximal side-port projects over distal esophagus; recommend advancing tube 5 cm. Scattered colonic diverticula noted. Nonobstructive bowel gas pattern. Scattered interstitial prominence and likely interstitial infiltrates in the mid to lower lungs. Bones demineralized. IMPRESSION: Recommend advancing nasogastric tube 5 cm. Electronically Signed   By: Ulyses SouthwardMark  Boles M.D.   On: 07/06/2019 16:14   Dg Abd 1 View  Result Date: 07/06/2019 CLINICAL DATA:  NG tube placement. EXAM: ABDOMEN - 1 VIEW COMPARISON:  07/04/2019 FINDINGS: Tip of the nasogastric tube projects in the distal esophagus and does not enter the stomach. Normal bowel gas pattern with residual contrast in the colon and rectum. IMPRESSION: Nasogastric tube tip lies in the distal esophagus. Electronically Signed   By: Amie Portlandavid  Ormond M.D.   On: 07/06/2019 14:17    EKG:   Orders placed or performed during the hospital encounter of 07/01/19  . ED EKG  . ED EKG  . ED EKG  . ED EKG    ASSESSMENT AND PLAN:     48 year old male patient with a known history of bipolar disorder, catatonic schizophrenia, GERD, schizoaffective disorder, tardive dyskinesia presented to the emergency room for difficulty swallowing and currently under hospitalist service.  *Dysphagia suspected from underlying psychiatric disorder -no physical issue per G.I. or ENT. -Speech therapy input appreciated. Patient is gonna be started on clear liquid diet with nectar thick liquid and if tolerates well will DC NG tube another 24 to 48 hours -DC IV fluids _Clear liquid with nectar thick as tolerated -Gastroenterology -Dr. Servando SnareWohl has seen the patient no further recommendations as per GI unless feeding tube / peg decided -Recent barium swallow shows normal esophagus, paraesophageal hernia -ENT consulted and no acute recommendation, throat cultures no growth so far  *Catatonic schizophrenia -Appreciate psychiatry follow-up -Electroconvulsive therapy started  -resumed Prozac and olanzapine, we have to monitor how much patient will swallow -Supportive care   *GERD Oral PPI  *DVT prophylaxis subcu Lovenox daily  *Ambulatory dysfunction Physical therapy evaluation appreciated Patient ambulates at home with the help of walker   *Adult failure to thrive Supportive care  CODE STATUS: fc   TOTAL TIME TAKING CARE OF THIS PATIENT: 35  minutes.    Note: This dictation was prepared with Dragon dictation along with smaller phrase technology. Any transcriptional errors that result from this process are unintentional.  Fritzi Mandes M.D on 07/07/2019 at 1:10 PM  Between 7am to 6pm - Pager - [phone] After 6pm go to [email or web address] - password [email or web address] [facility]  Office  [phone]  CC: Primary care physician; Celene Squibb, MD

## 2019-07-07 NOTE — Progress Notes (Signed)
Speech Therapy note: MD contacted this SLP for recommendations for oral diet for pt at this time w/ NGT in place. Per discussion, recent BSE, and pt's current mental status decline w/ NGT in place, recommend a clear liquid diet only of Nectar consistency w/ aspiration precautions in light of increased risk for aspiration (d/t above).  Recommend continue Meds via NGT for best method of intake. MD will consult ST services again if any decline in status w/ oral diet. NSG instructions placed. Aspiration precautions.    Orinda Kenner, [place], CCC-SLP

## 2019-07-07 NOTE — Progress Notes (Signed)
NG reinserted with placement verified; meds and resource breeze given. Pt more alert, oriented-able to tell name, "in the hospital", October. Pulling mitts off-frequently replaced. Pt misplaced saline lock-restarted. Frequent call outs/reassure/reinforced with safety education done. No BM x 4 days with miralax to start today.

## 2019-07-08 ENCOUNTER — Other Ambulatory Visit: Payer: Self-pay | Admitting: Psychiatry

## 2019-07-08 LAB — GLUCOSE, CAPILLARY
Glucose-Capillary: 127 mg/dL — ABNORMAL HIGH (ref 70–99)
Glucose-Capillary: 122 mg/dL — ABNORMAL HIGH (ref 70–99)

## 2019-07-08 MED ORDER — OLANZAPINE 10 MG IM SOLR
10.0000 mg | Freq: Four times a day (QID) | INTRAMUSCULAR | Status: DC | PRN
  Filled 2019-07-08: qty 10

## 2019-07-08 MED ORDER — PRO-STAT SUGAR FREE PO LIQD
30.0000 mL | Freq: Every day | ORAL | Status: DC
  Administered 2019-07-08 – 2019-07-10 (×3): 30 mL

## 2019-07-08 MED ORDER — FREE WATER
150.0000 mL | Status: DC
  Administered 2019-07-08 – 2019-07-10 (×10): 150 mL

## 2019-07-08 MED ORDER — JEVITY 1.2 CAL PO LIQD: 1000.0000 mL | ORAL | Status: DC

## 2019-07-08 MED ORDER — LORAZEPAM 2 MG/ML IJ SOLN
1.0000 mg | Freq: Four times a day (QID) | INTRAMUSCULAR | Status: DC
  Administered 2019-07-08 – 2019-07-11 (×10): 1 mg via INTRAVENOUS
  Filled 2019-07-08 (×11): qty 1

## 2019-07-08 MED ORDER — JEVITY 1.5 CAL/FIBER PO LIQD
1000.0000 mL | ORAL | Status: DC
  Administered 2019-07-08: 13:00:00 1000 mL via ORAL

## 2019-07-08 MED ORDER — ORAL CARE MOUTH RINSE
15.0000 mL | Freq: Two times a day (BID) | OROMUCOSAL | Status: DC
  Administered 2019-07-08 – 2019-07-17 (×17): 15 mL via OROMUCOSAL

## 2019-07-08 NOTE — Progress Notes (Signed)
Pt slow to arouse/wake this a.m. but became alert and made frequent call outs- nonspecfics late morning and afternoon. Poor po intake with efforts. NG tube feedings started and tolerating well.  Removes mitts and became mood becoming agitated this afternoon-psych in and states plans to adjust meds/plan ECT treatment tomorrow. No BM after miralax and juice today with provider and dietician aware.

## 2019-07-08 NOTE — Progress Notes (Signed)
[facility] - [place] at [facility]   PATIENT NAME: Troy Fox    MR#:  [id number]  DATE OF BIRTH:  31-Oct-1970  SUBJECTIVE:  patient more calm NG tube replaced. Verbally communicating today Had ECT therapy first session by psychiatry on 10/23 Tolerating nectar liquid per RN  REVIEW OF SYSTEMS:  limited due to psychiatric illness  DRUG ALLERGIES:   Allergies  Allergen Reactions  . Aripiprazole Other (See Comments)    Causes gambling addiction  *abilify     VITALS:  Blood pressure 112/78, pulse 72, temperature 98.7 F (37.1 C), temperature source Oral, resp. rate 18, height 5\' 5"  (1.651 m), weight 63.4 kg, SpO2 96 %.  PHYSICAL EXAMINATION:  GENERAL:  48 y.o.-year-old patient lying in the bed with no acute distress.  EYES: Pupils equal, round, reactive to light and accommodation. No scleral icterus. Extraocular muscles intact.  HEENT: Head atraumatic, normocephalic. Oropharynx and nasopharynx clear. NG+ NECK:  Supple, no jugular venous distention. No thyroid enlargement, no tenderness.  LUNGS: Normal breath sounds bilaterally, no wheezing, rales,rhonchi or crepitation. No use of accessory muscles of respiration.  CARDIOVASCULAR: S1, S2 normal. No murmurs, rubs, or gallops.  ABDOMEN: Soft, nontender, nondistended. Bowel sounds present. No organomegaly or mass.  EXTREMITIES: No pedal edema, cyanosis, or clubbing.  NEUROLOGIC:. Muscle strength 5/5 in all extremities. Sensation intact. Gait not checked. Moves all extremities well PSYCHIATRIC: The patient is alert and oriented x1.  SKIN: No obvious rash, lesion, or ulcer.    LABORATORY PANEL:   CBC Recent Labs  Lab 07/04/19 0328  WBC 4.2  HGB 14.6  HCT 42.2  PLT 148*   ------------------------------------------------------------------------------------------------------------------  Chemistries  Recent Labs  Lab 07/03/19 0844  07/06/19 0514  NA 143   < > 143  K 3.8   < > 3.7  CL 106    < > 108  CO2 21*   < > 22  GLUCOSE 92   < > 76  BUN 11   < > 10  CREATININE 0.77   < > 0.56*  CALCIUM 9.2   < > 9.3  AST 29  --   --   ALT 32  --   --   ALKPHOS 46  --   --   BILITOT 1.3*  --   --    < > = values in this interval not displayed.   ------------------------------------------------------------------------------------------------------------------  Cardiac Enzymes No results for input(s): TROPONINI in the last 168 hours. ------------------------------------------------------------------------------------------------------------------  RADIOLOGY:  Dg Abd 1 View  Result Date: 07/07/2019 CLINICAL DATA:  Enteric tube placement EXAM: ABDOMEN - 1 VIEW COMPARISON:  07/06/2019 abdominal radiograph FINDINGS: Enteric tube terminates in the gastric fundus. No dilated upper abdominal small bowel loops. Moderate colonic gas and stool in the visualized upper abdomen, with no evidence of pneumatosis or pneumoperitoneum. Small nodular opacity in the periphery of the right mid lung. IMPRESSION: Enteric tube terminates in the gastric fundus. Nonobstructive bowel gas pattern. Small nodular opacity in the periphery of the right mid lung, suggest chest CT evaluation when clinically feasible to exclude pulmonary nodule. Electronically Signed   By: 07/08/2019 M.D.   On: 07/07/2019 10:08   Dg Abd 1 View  Result Date: 07/06/2019 CLINICAL DATA:  Nasogastric tube placement EXAM: ABDOMEN - 1 VIEW COMPARISON:  July 06, 2019 study obtained earlier in the day FINDINGS: Nasogastric tube tip and side port are now well within the stomach. There is contrast in the colon with multiple contrast filled diverticula. There  is no bowel dilatation or air-fluid level to suggest bowel obstruction. No free air. Lung bases clear. IMPRESSION: Nasogastric tube tip and side port now within the stomach. Multiple colonic diverticula present. No bowel obstruction or free air evident. Lung bases clear. Electronically Signed    By: Lowella Grip III M.D.   On: 07/06/2019 17:39   Dg Abd 1 View  Result Date: 07/06/2019 CLINICAL DATA:  Nasogastric tube placement EXAM: ABDOMEN - 1 VIEW COMPARISON:  Portable exam 1556 hours compared to earlier study of 14/2 hours FINDINGS: Tip of nasogastric tube projects over stomach though the proximal side-port projects over distal esophagus; recommend advancing tube 5 cm. Scattered colonic diverticula noted. Nonobstructive bowel gas pattern. Scattered interstitial prominence and likely interstitial infiltrates in the mid to lower lungs. Bones demineralized. IMPRESSION: Recommend advancing nasogastric tube 5 cm. Electronically Signed   By: Lavonia Dana M.D.   On: 07/06/2019 16:14   Dg Abd 1 View  Result Date: 07/06/2019 CLINICAL DATA:  NG tube placement. EXAM: ABDOMEN - 1 VIEW COMPARISON:  07/04/2019 FINDINGS: Tip of the nasogastric tube projects in the distal esophagus and does not enter the stomach. Normal bowel gas pattern with residual contrast in the colon and rectum. IMPRESSION: Nasogastric tube tip lies in the distal esophagus. Electronically Signed   By: Lajean Manes M.D.   On: 07/06/2019 14:17    EKG:   Orders placed or performed during the hospital encounter of 07/01/19  . ED EKG  . ED EKG  . ED EKG  . ED EKG    ASSESSMENT AND PLAN:    48 year old male patient with a known history of bipolar disorder, catatonic schizophrenia, GERD, schizoaffective disorder, tardive dyskinesia presented to the emergency room for difficulty swallowing and currently under hospitalist service.  *Dysphagia suspected from underlying psychiatric disorder -no physical issue per G.I. or ENT. -Speech therapy input appreciated. Patient is gonna be started on clear liquid diet with nectar thick liquid and if tolerates well will DC NG tube another 24 to 48 hours -DC IV fluids _Clear liquid with nectar thick as tolerated -Start NG tube feeding per dietitians  recommnedations -Gastroenterology -Dr. Allen Norris has seen the patient no further recommendations as per GI unless feeding tube / peg decided -Recent barium swallow shows normal esophagus, paraesophageal hernia -ENT consulted and no acute recommendation, throat cultures no growth so far  *Catatonic schizophrenia -Appreciate psychiatry follow-up -Electroconvulsive therapy started--1st rx wa son 10/23--2nd planned for 10/26 by Dr Weber Cooks  -resumed Prozac and olanzapine, we have to monitor how much patient will swallow -Supportive care   *GERD Oral PPI  *DVT prophylaxis subcu Lovenox daily  *Ambulatory dysfunction Physical therapy evaluation appreciated Patient ambulates at home with the help of walker   *Adult failure to thrive Supportive care  CODE STATUS: fc   TOTAL TIME TAKING CARE OF THIS PATIENT: 30  minutes.    Note: This dictation was prepared with Dragon dictation along with smaller phrase technology. Any transcriptional errors that result from this process are unintentional.   Fritzi Mandes M.D on 07/08/2019 at 12:20 PM  Between 7am to 6pm - Pager - [phone] After 6pm go to [email or web address] - password [email or web address] [facility]  Office  [phone]  CC: Primary care physician; Celene Squibb, MD

## 2019-07-08 NOTE — Consult Note (Signed)
Psychiatry: ECT.  Follow-up note for this gentleman now receiving ECT treatment well-known to me from previous encounter.  Came by to see him this afternoon.  Nurse was administering thin liquid feeding supplement through his NG tube.  Patient was not exactly fighting but he was clearly unhappy with it.  Asked me a few times if I could "help him".  I offered sympathy and reminded him that he would get ECT tomorrow.  Explained to him that his poor p.o. intake made it necessary for him to get nutrition through the tube.  I took the liberty of increasing his IM Ativan to 1 mg every 6 hours and also put in a as needed for IM olanzapine for agitation.  We will see him tomorrow morning.  I have put in an n.p.o. order so he should be n.p.o. tonight with no tube feeds until after ECT tomorrow morning.

## 2019-07-09 ENCOUNTER — Inpatient Hospital Stay: Payer: Medicare Other | Admitting: Anesthesiology

## 2019-07-09 DIAGNOSIS — F251 Schizoaffective disorder, depressive type: Secondary | ICD-10-CM | POA: Diagnosis not present

## 2019-07-09 LAB — BASIC METABOLIC PANEL
Anion gap: 9 (ref 5–15)
BUN: 9 mg/dL (ref 6–20)
CO2: 30 mmol/L (ref 22–32)
Calcium: 9.3 mg/dL (ref 8.9–10.3)
Chloride: 104 mmol/L (ref 98–111)
Creatinine, Ser: 0.65 mg/dL (ref 0.61–1.24)
GFR calc Af Amer: >60 mL/min (ref >60)
GFR calc non Af Amer: >60 mL/min (ref >60)
Glucose, Bld: 101 mg/dL — ABNORMAL HIGH (ref 70–99)
Potassium: 2.6 mmol/L — CL (ref 3.5–5.1)
Sodium: 143 mmol/L (ref 135–145)

## 2019-07-09 LAB — MAGNESIUM: Magnesium: 2 mg/dL (ref 1.7–2.4)

## 2019-07-09 LAB — GLUCOSE, CAPILLARY
Glucose-Capillary: 107 mg/dL — ABNORMAL HIGH (ref 70–99)
Glucose-Capillary: 121 mg/dL — ABNORMAL HIGH (ref 70–99)
Glucose-Capillary: 134 mg/dL — ABNORMAL HIGH (ref 70–99)
Glucose-Capillary: 87 mg/dL (ref 70–99)

## 2019-07-09 LAB — PHOSPHORUS: Phosphorus: 4.5 mg/dL (ref 2.5–4.6)

## 2019-07-09 MED ORDER — SUCCINYLCHOLINE CHLORIDE 200 MG/10ML IV SOSY
PREFILLED_SYRINGE | INTRAVENOUS | Status: DC | PRN
  Administered 2019-07-09: 90 mg via INTRAVENOUS

## 2019-07-09 MED ORDER — MIDAZOLAM HCL 2 MG/2ML IJ SOLN: 2.0000 mg | Freq: Once | INTRAMUSCULAR | Status: DC

## 2019-07-09 MED ORDER — KETAMINE HCL 10 MG/ML IJ SOLN
INTRAMUSCULAR | Status: DC | PRN
  Administered 2019-07-09: 80 mg via INTRAVENOUS

## 2019-07-09 MED ORDER — POTASSIUM CHLORIDE 10 MEQ/100ML IV SOLN
10.0000 meq | INTRAVENOUS | Status: AC
  Administered 2019-07-09 (×3): 10 meq via INTRAVENOUS
  Filled 2019-07-09 (×4): qty 100

## 2019-07-09 MED ORDER — SODIUM CHLORIDE 0.9 % IV SOLN: 500.0000 mL | Freq: Once | INTRAVENOUS | Status: DC

## 2019-07-09 MED ORDER — SUCCINYLCHOLINE CHLORIDE 20 MG/ML IJ SOLN
INTRAMUSCULAR | Status: AC
  Filled 2019-07-09: qty 1

## 2019-07-09 MED ORDER — SODIUM CHLORIDE 0.9 % IV SOLN
INTRAVENOUS | Status: DC | PRN
  Administered 2019-07-09: 07:00:00 250 mL via INTRAVENOUS
  Administered 2019-07-09: 10:00:00 via INTRAVENOUS
  Administered 2019-07-12: 14:00:00 250 mL via INTRAVENOUS

## 2019-07-09 MED ORDER — POTASSIUM CHLORIDE CRYS ER 20 MEQ PO TBCR: 40.0000 meq | EXTENDED_RELEASE_TABLET | Freq: Once | ORAL | Status: DC

## 2019-07-09 MED ORDER — POTASSIUM CHLORIDE 10 MEQ/100ML IV SOLN
10.0000 meq | INTRAVENOUS | Status: AC
  Administered 2019-07-09 (×4): 10 meq via INTRAVENOUS
  Filled 2019-07-09 (×4): qty 100

## 2019-07-09 MED ORDER — MIDAZOLAM HCL 2 MG/2ML IJ SOLN
INTRAMUSCULAR | Status: AC
  Filled 2019-07-09: qty 2

## 2019-07-09 MED ORDER — KETAMINE HCL 50 MG/ML IJ SOLN
INTRAMUSCULAR | Status: AC
  Filled 2019-07-09: qty 10

## 2019-07-09 MED ORDER — SENNOSIDES-DOCUSATE SODIUM 8.6-50 MG PO TABS
2.0000 | ORAL_TABLET | Freq: Two times a day (BID) | ORAL | Status: DC
  Administered 2019-07-09 – 2019-07-17 (×13): 2 via ORAL
  Filled 2019-07-09 (×16): qty 2

## 2019-07-09 NOTE — Plan of Care (Signed)

## 2019-07-09 NOTE — Progress Notes (Signed)
Nutrition Follow Up Note   DOCUMENTATION CODES:   Not applicable  INTERVENTION:   Jevity 1.5 @45ml /hr- Resume tube feeds at 2ml/hr and increase by 57ml/hr q12 hours until goal rate is reached   Prostat liquid protein 30 ml daily via tube, each supplement provides 100 kcal, 15 grams protein.  Free water flushes 112ml q4 hours  Regimen provides 1720kcal/day, 84g/day protein, 23g/day fiber, 1756ml/day free water   Pt is at high refeed risk; recommend monitor K, Mg and P labs daily until stable  Bowel regimen as needed per MD  NUTRITION DIAGNOSIS:   Inadequate oral intake related to dysphagia as evidenced by other (comment)(per chart review).  GOAL:   Patient will meet greater than or equal to 90% of their needs  -progressing with tube feeds   MONITOR:   PO intake, Supplement acceptance, Labs, Weight trends, Skin, I & O's  ASSESSMENT:   48 year old male patient with a known history of bipolar disorder, catatonic schizophrenia, GERD, schizoaffective disorder, tardive dyskinesia presented to the emergency room for difficulty swallowing   Pt s/p NGT placement 10/24  Pt s/p ECT treatment today. Pt tolerating tube feeds well but has been unable to advance to goal rate r/t being made NPO for ECT treatments. Pt has not yet had a BM, RN notified; recommend bowel regimen as needed per MD. Pt seen by SLP and placed on nectar thick liquids; pt only taking in sips of liquids.   Per chart, pt appears fairly weight stable since admit  Medications reviewed and include: lovenox, protonix, senokot, B12, KCl  Labs reviewed: K 2.6(L), P 4.5 wnl, Mg 2.0 wnl  NUTRITION - FOCUSED PHYSICAL EXAM:    Most Recent Value  Orbital Region  No depletion  Upper Arm Region  No depletion  Thoracic and Lumbar Region  No depletion  Buccal Region  No depletion  Temple Region  Mild depletion  Clavicle Bone Region  Mild depletion  Clavicle and Acromion Bone Region  Mild depletion  Scapular Bone Region   No depletion  Dorsal Hand  Mild depletion  Patellar Region  No depletion  Anterior Thigh Region  Mild depletion  Posterior Calf Region  Mild depletion  Edema (RD Assessment)  None  Hair  Reviewed  Eyes  Reviewed  Mouth  Reviewed  Skin  Reviewed  Nails  Reviewed     Diet Order:   Diet Order            Diet NPO time specified  Diet effective midnight             EDUCATION NEEDS:   Not appropriate for education at this time  Skin:  Skin Assessment: Reviewed RN Assessment(ecchymosis)  Last BM:  10/21- type 4  Height:   Ht Readings from Last 1 Encounters:  07/05/19 5\' 5"  (1.651 m)    Weight:   Wt Readings from Last 1 Encounters:  07/09/19 64.5 kg    Ideal Body Weight:  56.8 kg  BMI:  Body mass index is 23.68 kg/m.  Estimated Nutritional Needs:   Kcal:  1600-1800kcal/day  Protein:  80-90g/day  Fluid:  >1.8L/day  Koleen Distance MS, RD, LDN Pager #- [phone] Office#- [phone] After Hours Pager: [phone]

## 2019-07-09 NOTE — Progress Notes (Addendum)
[facility] - [place] at [facility]   PATIENT NAME: Troy Fox    MR#:  [id number]  DATE OF BIRTH:  1971/01/06  SUBJECTIVE:   Patient is resting comfortably this morning.  He has no concerns.  NG tube remains in place.  REVIEW OF SYSTEMS:  limited due to psychiatric illness  DRUG ALLERGIES:   Allergies  Allergen Reactions  . Aripiprazole Other (See Comments)    Causes gambling addiction  *abilify     VITALS:  Blood pressure 127/83, pulse (!) 120, temperature 98.3 F (36.8 C), resp. rate 20, height 5\' 5"  (1.651 m), weight 64.5 kg, SpO2 95 %.  PHYSICAL EXAMINATION:  GENERAL:  48 y.o.-year-old patient lying in the bed with no acute distress.  EYES: Pupils equal, round, reactive to light and accommodation. No scleral icterus. Extraocular muscles intact.  HEENT: Head atraumatic, normocephalic. Oropharynx and nasopharynx clear. NG tube in place. NECK:  Supple, no jugular venous distention. No thyroid enlargement, no tenderness.  LUNGS: Normal breath sounds bilaterally, no wheezing, rales,rhonchi or crepitation. No use of accessory muscles of respiration.  CARDIOVASCULAR: RRR, S1, S2 normal. No murmurs, rubs, or gallops.  ABDOMEN: Soft, nontender, nondistended. Bowel sounds present. No organomegaly or mass.  EXTREMITIES: No pedal edema, cyanosis, or clubbing.  NEUROLOGIC: Moving all extremities, not consistently following commands. PSYCHIATRIC: The patient is alert. Does not answer questions of orientation. SKIN: No obvious rash, lesion, or ulcer.    LABORATORY PANEL:   CBC Recent Labs  Lab 07/04/19 0328  WBC 4.2  HGB 14.6  HCT 42.2  PLT 148*   ------------------------------------------------------------------------------------------------------------------  Chemistries  Recent Labs  Lab 07/03/19 0844  07/09/19 0410  NA 143   < > 143  K 3.8   < > 2.6*  CL 106   < > 104  CO2 21*   < > 30  GLUCOSE 92   < > 101*  BUN 11   < > 9   CREATININE 0.77   < > 0.65  CALCIUM 9.2   < > 9.3  MG  --   --  2.0  AST 29  --   --   ALT 32  --   --   ALKPHOS 46  --   --   BILITOT 1.3*  --   --    < > = values in this interval not displayed.   ------------------------------------------------------------------------------------------------------------------  Cardiac Enzymes No results for input(s): TROPONINI in the last 168 hours. ------------------------------------------------------------------------------------------------------------------  RADIOLOGY:  No results found.  EKG:   Orders placed or performed during the hospital encounter of 07/01/19  . ED EKG  . ED EKG  . ED EKG  . ED EKG    ASSESSMENT AND PLAN:   Dysphagia- likely due to underlying psychiatric disorder vs tardive dyskinesia secondary to psychiatric medicines -Recent EGD 06/15/2019 with normal esophagus and some gastritis -Recent barium swallow with normal esophagus and a paraesophageal hernia -GI has signed off -Seen by ENT, who did not feel that he had any pharyngeal impediment to swallowing -Seen by SLP -Continue feeds via NG tube  Catatonic schizophrenia -Underwent electroconvulsive therapy on 10/23 -Psychiatry is planning for second ECT treatment today -Continue Prozac -Ativan has been increased to 1 mg every 6 hours -Zyprexa IM prn agitation  Hypokalemia -Replete and recheck  GERD -Continue PPI  Mild malnutrition -Continue tube feeds  DVT prophylaxis-Lovenox  Attempted to call patient's mother, 11/23, but unable to get in touch with her.  CODE STATUS: Full code  TOTAL TIME TAKING CARE OF THIS PATIENT: 35  minutes.    Note: This dictation was prepared with Dragon dictation along with smaller phrase technology. Any transcriptional errors that result from this process are unintentional.   Evette Doffing M.D on 07/09/2019 at 1:34 PM  Between 7am to 6pm - Pager - [phone]  After 6pm go to [email or web address] - password [employer]  [facility] Office  [phone]  CC: Primary care physician; Celene Squibb, MD

## 2019-07-09 NOTE — Anesthesia Postprocedure Evaluation (Signed)
Anesthesia Post Note  Patient: Troy Fox  Procedure(s) Performed: ECT TX  Patient location during evaluation: PACU Anesthesia Type: General Level of consciousness: awake and alert Pain management: pain level controlled Vital Signs Assessment: post-procedure vital signs reviewed and stable Respiratory status: spontaneous breathing, nonlabored ventilation and respiratory function stable Cardiovascular status: blood pressure returned to baseline and stable Postop Assessment: no apparent nausea or vomiting Anesthetic complications: no     Last Vitals:  Vitals:   07/09/19 1129 07/09/19 1409  BP: 127/83   Pulse: (!) 120 (!) 101  Resp: 20   Temp:    SpO2: 95%     Last Pain:  Vitals:   07/09/19 1119  TempSrc:   PainSc: 0-No pain                 Durenda Hurt

## 2019-07-09 NOTE — Progress Notes (Signed)
CRITICAL VALUE ALERT  Critical Value:  K 2.6  Date & Time Notied:  07/09/19 @0530   Provider Notified: Rufina Falco  Orders Received/Actions taken: see new orders

## 2019-07-09 NOTE — Anesthesia Procedure Notes (Signed)
Date/Time: 07/09/2019 10:34 AM Performed by: Dionne Bucy, CRNA Pre-anesthesia Checklist: Patient identified, Emergency Drugs available, Suction available and Patient being monitored Patient Re-evaluated:Patient Re-evaluated prior to induction Oxygen Delivery Method: Circle system utilized Preoxygenation: Pre-oxygenation with 100% oxygen Induction Type: IV induction Ventilation: Mask ventilation without difficulty and Mask ventilation throughout procedure Airway Equipment and Method: Bite block Placement Confirmation: positive ETCO2 Dental Injury: Teeth and Oropharynx as per pre-operative assessment

## 2019-07-09 NOTE — Transfer of Care (Signed)
Immediate Anesthesia Transfer of Care Note  Patient: Jakwan Sally  Procedure(s) Performed: ECT TX  Patient Location: PACU  Anesthesia Type:General  Level of Consciousness: sedated  Airway & Oxygen Therapy: Patient Spontanous Breathing and Patient connected to face mask oxygen  Post-op Assessment: Report given to RN and Post -op Vital signs reviewed and stable  Post vital signs: Reviewed and stable  Last Vitals:  Vitals Value Taken Time  BP 129/97 07/09/19 1049  Temp 36.8 C 07/09/19 1049  Pulse 124 07/09/19 1055  Resp 13 07/09/19 1055  SpO2 99 % 07/09/19 1055  Vitals shown include unvalidated device data.  Last Pain:  Vitals:   07/09/19 0937  TempSrc: Oral  PainSc: 0-No pain      Patients Stated Pain Goal: Other (Comment)(patient noncommunicable ) (62/56/38 9373)  Complications: No apparent anesthesia complications

## 2019-07-09 NOTE — Progress Notes (Signed)
PT Cancellation Note  Patient Details Name: Troy Fox MRN: [id number] DOB: [birth year]   Cancelled Treatment:    Reason Eval/Treat Not Completed: Medical issues which prohibited therapy. Per chart review, pt with critical potassium level of 2.6. Supplementation ordered, but not received at this time. Re attempt when medically stable per lab value.    Larae Grooms, PTA 07/09/2019, 11:38 AM

## 2019-07-09 NOTE — Procedures (Signed)
ECT SERVICES Physician's Interval Evaluation & Treatment Note  Patient Identification: Troy Fox MRN:  [id number] Date of Evaluation:  07/09/2019 TX #: 2  MADRS:   MMSE:   P.E. Findings:  Patient is physically wasted.  Not able to get up out of bed.  Now on tube feedings.  Psychiatric Interval Note:  Blunted seems to be in some distress.  Able to answer a few questions but not very lucidly  Subjective:  Patient is a 48 y.o. male seen for evaluation for Electroconvulsive Therapy. Complains of being in general discomfort  Treatment Summary:   []   Right Unilateral             [x]  Bilateral   % Energy : 1.0 ms 100%   Impedance: 2010 ohms  Seizure Energy Index: 9742 V squared  Postictal Suppression Index: This was read as less than 10% but I do not think that is accurate because I think the computer miss read the ending of the seizure I actually think it was pretty good but we do not have a number for it  Seizure Concordance Index: 98%  Medications  Pre Shock: Ketamine 80 mg succinylcholine 90 mg  Post Shock: Versed 1 mg  Seizure Duration: 9 seconds by EMG in about 10 seconds by EEG   Comments: Subadequate seizure.  This despite using ketamine and having the machine turned up all the way.  I am talking with anesthesia about trying to switch to remifentanil as primary inducing anesthetic next time which is 1 step slightly better than ketamine in terms of seizures.  Next treatment Wednesday  Lungs:  [x]   Clear to auscultation               []  Other:   Heart:    [x]   Regular rhythm             []  irregular rhythm    [x]   Previous H&P reviewed, patient examined and there are NO CHANGES                 []   Previous H&P reviewed, patient examined and there are changes noted.   Alethia Berthold, MD 10/26/20205:37 PM

## 2019-07-09 NOTE — H&P (Signed)
Jetty DuhamelStephen Santerre is an 48 y.o. male.   Chief Complaint: Patient cannot articulate a single chief complaint although he makes it clear that he is in distress.  Clearly is in some physical discomfort at times although there is no specific locus for the pain.  Not really able to discuss mood states or cognitive states in any meaningful way. HPI: This is a patient with schizoaffective disorder of longstanding who is very withdrawn blunted barely interactive although he is able to answer some simple questions.  Lack of activity has left him physically wasted.  Past Medical History:  Diagnosis Date  . Bipolar 1 disorder (HCC)   . 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 GERD (gastroesophageal reflux disease)   . Nonverbal   . Schizoaffective disorder (HCC)   . Tardive dyskinesia     Past Surgical History:  Procedure Laterality Date  . BIOPSY  06/15/2019   Procedure: BIOPSY;  Surgeon: Malissa Hippoehman, Najeeb U, MD;  [facility];  Service: Endoscopy;;  gastric  . ESOPHAGOGASTRODUODENOSCOPY (EGD) WITH PROPOFOL N/A 06/15/2019   Procedure: ESOPHAGOGASTRODUODENOSCOPY (EGD) WITH PROPOFOL;  Surgeon: Malissa Hippoehman, Najeeb U, MD;  Location: [facility];  Service: Endoscopy;  Laterality: N/A;  7:30    Family History  Adopted: Yes  Family history unknown: Yes   Social History:  reports that he has never smoked. He has never used smokeless tobacco. He reports that he does not drink alcohol or use drugs.  Allergies:  Allergies  Allergen Reactions  . Aripiprazole Other (See Comments)    Causes gambling addiction  *abilify     Medications Prior to Admission  Medication Sig Dispense Refill  . acetaminophen (TYLENOL) 500 MG tablet Take 1,000 mg by mouth every 8 (eight) hours as needed for moderate pain.    Marland Kitchen. alum & mag hydroxide-simeth (MYLANTA) 200-200-20 MG/5ML suspension Take 30 mLs by mouth every 6 (six) hours as needed for indigestion or heartburn.    . cyanocobalamin 1000 MCG tablet Take 1 tablet (1,000 mcg  total) by mouth daily. 30 tablet 1  . dicyclomine (BENTYL) 20 MG tablet Take 1 tablet (20 mg total) by mouth 2 (two) times daily as needed for spasms. 20 tablet 0  . FLUoxetine (PROZAC) 40 MG capsule TAKE 1 CAPSULE BY MOUTH DAILY. (Patient taking differently: Take 40 mg by mouth daily. ) 30 capsule 0  . OLANZapine (ZYPREXA) 10 MG tablet Take 10 mg by mouth at bedtime.     Marland Kitchen. omeprazole (PRILOSEC) 40 MG capsule Take 40 mg by mouth daily.    . traZODone (DESYREL) 100 MG tablet Take 1 tablet (100 mg total) by mouth at bedtime. 30 tablet 1  . Valbenazine Tosylate (INGREZZA) 80 MG CAPS Take 30 mg by mouth daily. (Patient taking differently: Take 80 mg by mouth daily. ) 30 capsule 0    Results for orders placed or performed during the hospital encounter of 07/01/19 (from the past 48 hour(s))  Glucose, capillary     Status: Abnormal   Collection Time: 07/08/19  4:59 PM  Result Value Ref Range   Glucose-Capillary 127 (H) 70 - 99 mg/dL  Glucose, capillary     Status: Abnormal   Collection Time: 07/08/19  8:06 PM  Result Value Ref Range   Glucose-Capillary 122 (H) 70 - 99 mg/dL   Comment 1 Notify RN   Glucose, capillary     Status: Abnormal   Collection Time: 07/09/19 12:08 AM  Result Value Ref Range   Glucose-Capillary 134 (H) 70 - 99  mg/dL   Comment 1 Notify RN   Basic metabolic panel     Status: Abnormal   Collection Time: 07/09/19  4:10 AM  Result Value Ref Range   Sodium 143 135 - 145 mmol/L   Potassium 2.6 (LL) 3.5 - 5.1 mmol/L    Comment: CRITICAL RESULT CALLED TO, READ BACK BY AND VERIFIED WITH [facility]RIST[facility] ON 10/26 AT 0516 [facility]    Chloride 104 98 - 111 mmol/L   CO2 30 22 - 32 mmol/L   Glucose, Bld 101 (H) 70 - 99 mg/dL   BUN 9 6 - 20 mg/dL   Creatinine, Ser 1.74 0.61 - 1.24 mg/dL   Calcium 9.3 8.9 - 08.1 mg/dL   GFR calc non Af Amer >60 >60 mL/min   GFR calc Af Amer >60 >60 mL/min   Anion gap 9 5 - 15    Comment: Performed at [facility], [address]  [place], [place] [address]  Phosphorus     Status: None   Collection Time: 07/09/19  4:10 AM  Result Value Ref Range   Phosphorus 4.5 2.5 - 4.6 mg/dL    Comment: Performed at [facility], [address] [place], [place] [address]  Magnesium     Status: None   Collection Time: 07/09/19  4:10 AM  Result Value Ref Range   Magnesium 2.0 1.7 - 2.4 mg/dL    Comment: Performed at [facility], [address] [place], [place] [address]  Glucose, capillary     Status: None   Collection Time: 07/09/19  7:51 AM  Result Value Ref Range   Glucose-Capillary 87 70 - 99 mg/dL  Glucose, capillary     Status: Abnormal   Collection Time: 07/09/19 10:53 AM  Result Value Ref Range   Glucose-Capillary 121 (H) 70 - 99 mg/dL   No results found.  Review of Systems  Unable to perform ROS: Psychiatric disorder  Psychiatric/Behavioral: The patient is nervous/anxious.     Blood pressure 122/84, pulse (!) 125, temperature 98.6 F (37 C), resp. rate 20, height 5\' 5"  (1.651 m), weight 64.5 kg, SpO2 100 %. Physical Exam  Constitutional: He appears well-developed. He appears distressed.  HENT:  Head: Normocephalic and atraumatic.  Patient has a nasogastric feeding tube in place.  Large gauge of the type that can be used for suction as well  Eyes: Pupils are equal, round, and reactive to light. Conjunctivae are normal.  Neck: Normal range of motion.  Cardiovascular: Normal heart sounds.  Respiratory: Effort normal.  GI: Soft.  Musculoskeletal: Normal range of motion.     Comments: He sits with his legs all curled up most of the time.  Not clear whether he is actually developed any contractures since his lack of motion is largely related to his psychiatric condition.  Certainly looks wasted though with little muscle mass.  Neurological: He is alert.  Skin: Skin is warm and dry.  Psychiatric: His mood appears anxious. His affect is blunt. His speech is delayed. He is slowed and withdrawn.  Cognition and memory are impaired. He expresses inappropriate judgment. He is noncommunicative.     Assessment/Plan Patient is receiving electroconvulsive therapy based on his previous good response.  Last time we did this about 2 or 3 treatments in he showed a clear improvement in his energy level and his interaction.  On the other hand he also eventually got more cognitively impaired so we will need to be careful with it.  Nevertheless bilateral treatment  today.  Alethia Berthold, MD 07/09/2019, 5:32 PM

## 2019-07-09 NOTE — Progress Notes (Signed)
Patients HR 128. Covering provider Stark Klein) notified. IV Metoprolol administered and HR down to 108.

## 2019-07-09 NOTE — Anesthesia Preprocedure Evaluation (Signed)
Anesthesia Evaluation  Patient identified by MRN, date of birth, ID band Patient awake    Reviewed: Allergy & Precautions, H&P , NPO status , Patient's Chart, lab work & pertinent test results  Airway   TM Distance: >3 FB    Comment: Unable to comply with Mallampati today, airway appears normal Dental  (+) Missing   Pulmonary neg pulmonary ROS, neg COPD,           Cardiovascular (-) Past MI negative cardio ROS  (-) dysrhythmias      Neuro/Psych PSYCHIATRIC DISORDERS Depression Bipolar Disorder Schizophrenia negative neurological ROS     GI/Hepatic Neg liver ROS, hiatal hernia, GERD  ,  Endo/Other  negative endocrine ROSneg diabetes  Renal/GU negative Renal ROS  negative genitourinary   Musculoskeletal   Abdominal   Peds  Hematology negative hematology ROS (+)   Anesthesia Other Findings Past Medical History: No date: Bipolar 1 disorder ([place]) No date: 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[place]) No date: GERD (gastroesophageal reflux disease) No date: Nonverbal No date: Schizoaffective disorder ([place]) No date: Tardive dyskinesia  Past Surgical History: 06/15/2019: BIOPSY     Comment:  Procedure: BIOPSY;  Surgeon: Rogene Houston, MD;                [facility];  Service: Endoscopy;;  gastric 06/15/2019: ESOPHAGOGASTRODUODENOSCOPY (EGD) WITH PROPOFOL; N/A     Comment:  Procedure: ESOPHAGOGASTRODUODENOSCOPY (EGD) WITH               PROPOFOL;  Surgeon: Rogene Houston, MD;  Location: [facility];  Service: Endoscopy;  Laterality: N/A;  7:30  BMI    Body Mass Index: 23.68 kg/m      Reproductive/Obstetrics negative OB ROS                             Anesthesia Physical Anesthesia Plan  ASA: II  Anesthesia Plan: General   Post-op Pain Management:    Induction:   PONV Risk Score and Plan:   Airway Management Planned: Mask  Additional Equipment:   Intra-op  Plan:   Post-operative Plan:   Informed Consent: I have reviewed the patients History and Physical, chart, labs and discussed the procedure including the risks, benefits and alternatives for the proposed anesthesia with the patient or authorized representative who has indicated his/her understanding and acceptance.     Dental Advisory Given  Plan Discussed with: Anesthesiologist, CRNA and Surgeon  Anesthesia Plan Comments:         Anesthesia Quick Evaluation

## 2019-07-09 NOTE — Anesthesia Post-op Follow-up Note (Signed)
Anesthesia QCDR form completed.        

## 2019-07-09 NOTE — Care Management Important Message (Signed)
Important Message  Patient Details  Name: Troy Fox MRN: [id number] Date of Birth: 08/05/1971   Medicare Important Message Given:  Yes     Juliann Pulse A Donovon Micheletti 07/09/2019, 12:13 PM

## 2019-07-10 ENCOUNTER — Inpatient Hospital Stay: Payer: Medicare Other

## 2019-07-10 ENCOUNTER — Other Ambulatory Visit: Payer: Self-pay | Admitting: Psychiatry

## 2019-07-10 LAB — GLUCOSE, CAPILLARY
Glucose-Capillary: 108 mg/dL — ABNORMAL HIGH (ref 70–99)
Glucose-Capillary: 122 mg/dL — ABNORMAL HIGH (ref 70–99)
Glucose-Capillary: 113 mg/dL — ABNORMAL HIGH (ref 70–99)
Glucose-Capillary: 126 mg/dL — ABNORMAL HIGH (ref 70–99)

## 2019-07-10 LAB — MAGNESIUM: Magnesium: 2.1 mg/dL (ref 1.7–2.4)

## 2019-07-10 LAB — BASIC METABOLIC PANEL
Anion gap: 6 (ref 5–15)
BUN: 10 mg/dL (ref 6–20)
CO2: 30 mmol/L (ref 22–32)
Calcium: 9.2 mg/dL (ref 8.9–10.3)
Chloride: 106 mmol/L (ref 98–111)
Creatinine, Ser: 0.57 mg/dL — ABNORMAL LOW (ref 0.61–1.24)
GFR calc Af Amer: >60 mL/min (ref >60)
GFR calc non Af Amer: >60 mL/min (ref >60)
Glucose, Bld: 121 mg/dL — ABNORMAL HIGH (ref 70–99)
Potassium: 3.3 mmol/L — ABNORMAL LOW (ref 3.5–5.1)
Sodium: 142 mmol/L (ref 135–145)

## 2019-07-10 LAB — PHOSPHORUS: Phosphorus: 3.1 mg/dL (ref 2.5–4.6)

## 2019-07-10 MED ORDER — SODIUM CHLORIDE 0.9 % IV SOLN
500.0000 mL | Freq: Once | INTRAVENOUS | Status: AC
  Administered 2019-07-11: 09:00:00 250 mL via INTRAVENOUS

## 2019-07-10 MED ORDER — POTASSIUM CHLORIDE 10 MEQ/100ML IV SOLN
10.0000 meq | INTRAVENOUS | Status: AC
  Administered 2019-07-10 (×3): 10 meq via INTRAVENOUS
  Filled 2019-07-10 (×2): qty 100

## 2019-07-10 MED ORDER — JEVITY 1.5 CAL/FIBER PO LIQD
1000.0000 mL | ORAL | Status: DC
  Administered 2019-07-10: 1000 mL

## 2019-07-10 NOTE — Progress Notes (Signed)
Physical Therapy Treatment Patient Details Name: Troy Fox MRN: [id number] DOB: [birth year] Today's Date: 07/10/2019    History of Present Illness 48 y.o. male with bipolar 1, catatonia schizophrenia, tardive dyskinesia who presents with tremors.  Patient had tremors in the past 3 weeks has had trouble swallowing food.  Patient had a upper GI study that showed normal esophagus with moderate size paraesophageal hernia.  Patient is father noted that he has been having increased drooling and shaking episodes.  Patient was seen here on 10/5 and got Benadryl and Ativan and was better but but then has progressively getting worse over the past 3 weeks where he is not eating very well.  He has been having to grind of his food and do shakes.  She he is not sure if he is been having some shortness of breath.  He also mentioned that he is been having some back pain is been going on for months.  He has had some previous falls none recently.     PT Comments    Pt lethargic t/o session, and though pt is not familiar to this [facility] it appears he is considerably more limited physically and mentally than previous PT sessions.  Pt was slow and labored with all tasks and struggled with all mobility.  He did manage to take a few very slow and labored side steps at EOB but needed constant assist simply to keep him from fully pitching forward.  He could not self regulate this and struggled to follow instructions, was drooling t/o the session and could not consistently keep eyes open.  Pt very functionally limited and will need 24/7 supervision at time of discharge.  Follow Up Recommendations  SNF;Supervision/Assistance - 24 hour     Equipment Recommendations  Rolling walker with 5" wheels    Recommendations for Other Services       Precautions / Restrictions Precautions Precautions: Fall Restrictions Weight Bearing Restrictions: No    Mobility  Bed Mobility Overal bed mobility: Needs Assistance Bed  Mobility: Supine to Sit     Supine to sit: Mod assist;Min assist Sit to supine: Mod assist   General bed mobility comments: Pt showed effort in getting to/from EOB but completely unable to do so w/o assist and once in sitting unable keep himself from leaning forward to nearly chin to knees w/o direct assist  Transfers Overall transfer level: Needs assistance Equipment used: Rolling walker (2 wheeled) Transfers: Sit to/from Stand Sit to Stand: Mod assist         General transfer comment: Pt able to give some effort in getting to standing but needed assist to initiate rising, to keep from falling forward and some assist to keep hands on walker (mitts removed for mobility)  Ambulation/Gait             General Gait Details: No ambulation. Pt was able to take a few very slow, very labored side steps along EOB.  Attempt at stepping forward (away from bed) was unsafe with pt struggling to even advance feet and then pitching forward despite constant verbal and tactile cues to improve posture, use walker appropriate and be aware of safety.   Stairs             Wheelchair Mobility    Modified Rankin (Stroke Patients Only)       Balance  Cognition Arousal/Alertness: Lethargic Behavior During Therapy: Flat affect Overall Cognitive Status: Difficult to assess                                 General Comments: appeared less interactive with PT today than previous sessions, pt drooling t/o the entire session      Exercises Other Exercises Other Exercises: attempted simple supine exercises with U&LEs.  Able to do a few inconsistent reps here and there with U&LEs but could not maintain consistent set/reps/fully follow instructions    General Comments        Pertinent Vitals/Pain Pain Assessment: (mumbles "yes" to pain but could not say where or how much)    Home Living                       Prior Function            PT Goals (current goals can now be found in the care plan section) Progress towards PT goals: Not progressing toward goals - comment(pt weaker and more functionally limited today)    Frequency    Min 2X/week      PT Plan Current plan remains appropriate    Co-evaluation              AM-PAC PT "6 Clicks" Mobility   Outcome Measure  Help needed turning from your back to your side while in a flat bed without using bedrails?: A Little Help needed moving from lying on your back to sitting on the side of a flat bed without using bedrails?: A Lot Help needed moving to and from a bed to a chair (including a wheelchair)?: A Lot Help needed standing up from a chair using your arms (e.g., wheelchair or bedside chair)?: A Lot Help needed to walk in hospital room?: Total Help needed climbing 3-5 steps with a railing? : Total 6 Click Score: 11    End of Session Equipment Utilized During Treatment: Gait belt Activity Tolerance: Patient tolerated treatment well Patient left: with call bell/phone within reach;with nursing/sitter in room;with bed alarm set   PT Visit Diagnosis: Unsteadiness on feet (R26.81);Muscle weakness (generalized) (M62.81);Difficulty in walking, not elsewhere classified (R26.2)     Time: 9163-8466 PT Time Calculation (min) (ACUTE ONLY): 27 min  Charges:  $Therapeutic Activity: 23-37 mins                     Malachi Pro, DPT 07/10/2019, 5:49 PM

## 2019-07-10 NOTE — Consult Note (Signed)
  ECT: Came by to see patient this afternoon.  He no longer had a feeding tube in place.  Not clear to me if he pulled it out although based on the hospitalist note it sounds like that is most likely.  Patient's mucous membranes are extremely dry and when he attempts to talk his voice is extremely raspy.  He is clearly dehydrated but does not seem to have been making an effort to drink.  Tried to talk with him but he really could not answer in the lucid way.  Seemed worse off than the last time I saw him.  Patient has had 2 ECT treatments now although both of them have been of inadequate duration because of his resistance to seizures.  Not clear if there is anything we will be able to do tomorrow to get a more adequate seizure although we may try changing anesthesia again.  If possible it would probably be a good idea to see if we can continue through Friday although without any further improvement prognosis is looking worse.

## 2019-07-10 NOTE — Progress Notes (Signed)
[facility] at [place] NAME: Troy Fox    MR#:  [id number]  DATE OF BIRTH:  10-31-1970  SUBJECTIVE:   Patient is sleeping this morning.  He opens his eyes to voice, but then subsequently closes them.  Does not speak or follow commands.  REVIEW OF SYSTEMS:  limited due to psychiatric illness  DRUG ALLERGIES:   Allergies  Allergen Reactions  . Aripiprazole Other (See Comments)    Causes gambling addiction  *abilify     VITALS:  Blood pressure 124/82, pulse 97, temperature 98.8 F (37.1 C), temperature source Oral, resp. rate 17, height 5\' 5"  (1.651 m), weight 62.3 kg, SpO2 94 %.  PHYSICAL EXAMINATION:  GENERAL:  48 y.o.-year-old patient lying in the bed with no acute distress.  EYES: Pupils equal, round, reactive to light and accommodation. No scleral icterus. Extraocular muscles intact.  HEENT: Head atraumatic, normocephalic. Oropharynx and nasopharynx clear. NG tube in place. NECK:  Supple, no jugular venous distention. No thyroid enlargement, no tenderness.  LUNGS: Normal breath sounds bilaterally, no wheezing, rales,rhonchi or crepitation. No use of accessory muscles of respiration.  CARDIOVASCULAR: RRR, S1, S2 normal. No murmurs, rubs, or gallops.  ABDOMEN: Soft, nontender, nondistended. Bowel sounds present. No organomegaly or mass.  EXTREMITIES: No pedal edema, cyanosis, or clubbing.  NEUROLOGIC: Opens eyes to voice, but does not follow commands or speak. PSYCHIATRIC: Sleepy.  Does not answer questions of orientation. SKIN: No obvious rash, lesion, or ulcer.    LABORATORY PANEL:   CBC Recent Labs  Lab 07/04/19 0328  WBC 4.2  HGB 14.6  HCT 42.2  PLT 148*   ------------------------------------------------------------------------------------------------------------------  Chemistries  Recent Labs  Lab 07/10/19 0553  NA 142  K 3.3*  CL 106  CO2 30  GLUCOSE 121*  BUN 10  CREATININE 0.57*  CALCIUM 9.2   MG 2.1   ------------------------------------------------------------------------------------------------------------------  Cardiac Enzymes No results for input(s): TROPONINI in the last 168 hours. ------------------------------------------------------------------------------------------------------------------  RADIOLOGY:  No results found.  EKG:   Orders placed or performed during the hospital encounter of 07/01/19  . ED EKG  . ED EKG  . ED EKG  . ED EKG    ASSESSMENT AND PLAN:   Dysphagia- likely due to underlying psychiatric disorder vs tardive dyskinesia secondary to psychiatric medicines -Recent EGD 06/15/2019 with normal esophagus and some gastritis -Recent barium swallow with normal esophagus and a paraesophageal hernia -GI has signed off -Seen by ENT, who did not feel that he had any pharyngeal impediment to swallowing -Seen by SLP -Continue feeds via NG tube  Catatonic schizophrenia -Underwent electroconvulsive therapy on 10/23 and 10/26 -Psychiatry is planning for third ECT treatment tomorrow -Continue Prozac -Continue ativan 1 mg every 6 hours -Zyprexa IM prn agitation  Hypokalemia -Replete and recheck  GERD -Continue PPI  Mild malnutrition -Continue tube feeds  DVT prophylaxis-Lovenox  Attempted to call patient's mother, Troy Fox, but unable to get in touch with her.  CODE STATUS: Full code  TOTAL TIME TAKING CARE OF THIS PATIENT: 32 minutes.    Note: This dictation was prepared with Dragon dictation along with smaller phrase technology. Any transcriptional errors that result from this process are unintentional.   Evette Doffing M.D on 07/10/2019 at 1:47 PM  Between 7am to 6pm - Pager - [phone]  After 6pm go to [email or web address] - password [email or web address]  [facility]  [phone]  CC: Primary care physician; Celene Squibb, MD

## 2019-07-11 ENCOUNTER — Inpatient Hospital Stay: Payer: Medicare Other

## 2019-07-11 ENCOUNTER — Inpatient Hospital Stay: Payer: Medicare Other | Admitting: Anesthesiology

## 2019-07-11 DIAGNOSIS — F251 Schizoaffective disorder, depressive type: Secondary | ICD-10-CM | POA: Diagnosis not present

## 2019-07-11 LAB — BASIC METABOLIC PANEL
Anion gap: 10 (ref 5–15)
BUN: 9 mg/dL (ref 6–20)
CO2: 27 mmol/L (ref 22–32)
Calcium: 9 mg/dL (ref 8.9–10.3)
Chloride: 105 mmol/L (ref 98–111)
Creatinine, Ser: 0.53 mg/dL — ABNORMAL LOW (ref 0.61–1.24)
GFR calc Af Amer: >60 mL/min (ref >60)
GFR calc non Af Amer: >60 mL/min (ref >60)
Glucose, Bld: 105 mg/dL — ABNORMAL HIGH (ref 70–99)
Potassium: 3.4 mmol/L — ABNORMAL LOW (ref 3.5–5.1)
Sodium: 142 mmol/L (ref 135–145)

## 2019-07-11 LAB — CBC
HCT: 39.7 % (ref 39.0–52.0)
Hemoglobin: 13.6 g/dL (ref 13.0–17.0)
MCH: 30.4 pg (ref 26.0–34.0)
MCHC: 34.3 g/dL (ref 30.0–36.0)
MCV: 88.8 fL (ref 80.0–100.0)
Platelets: 158 10*3/uL (ref 150–400)
RBC: 4.47 MIL/uL (ref 4.22–5.81)
RDW: 13.7 % (ref 11.5–15.5)
WBC: 4.7 10*3/uL (ref 4.0–10.5)
nRBC: 0 % (ref 0.0–0.2)

## 2019-07-11 LAB — GLUCOSE, CAPILLARY
Glucose-Capillary: 100 mg/dL — ABNORMAL HIGH (ref 70–99)
Glucose-Capillary: 114 mg/dL — ABNORMAL HIGH (ref 70–99)
Glucose-Capillary: 114 mg/dL — ABNORMAL HIGH (ref 70–99)
Glucose-Capillary: 80 mg/dL (ref 70–99)
Glucose-Capillary: 97 mg/dL (ref 70–99)

## 2019-07-11 MED ORDER — SODIUM CHLORIDE (PF) 0.9 % IJ SOLN
INTRAMUSCULAR | Status: AC
  Filled 2019-07-11: qty 20

## 2019-07-11 MED ORDER — NALOXONE HCL 0.4 MG/ML IJ SOLN
INTRAMUSCULAR | Status: DC | PRN
  Administered 2019-07-11: 0.2 mg via INTRAVENOUS

## 2019-07-11 MED ORDER — LACTATED RINGERS IV BOLUS
1000.0000 mL | Freq: Once | INTRAVENOUS | Status: AC
  Administered 2019-07-11: 10:00:00 1000 mL via INTRAVENOUS

## 2019-07-11 MED ORDER — LACTATED RINGERS IV SOLN
INTRAVENOUS | Status: DC | PRN
  Administered 2019-07-11: 10:00:00 via INTRAVENOUS

## 2019-07-11 MED ORDER — SUCCINYLCHOLINE CHLORIDE 200 MG/10ML IV SOSY
PREFILLED_SYRINGE | INTRAVENOUS | Status: DC | PRN
  Administered 2019-07-11: 90 mg via INTRAVENOUS

## 2019-07-11 MED ORDER — POTASSIUM CHLORIDE 10 MEQ/100ML IV SOLN
10.0000 meq | INTRAVENOUS | Status: AC
  Administered 2019-07-11 (×2): 10 meq via INTRAVENOUS
  Filled 2019-07-11: qty 100

## 2019-07-11 MED ORDER — REMIFENTANIL HCL 1 MG IV SOLR
INTRAVENOUS | Status: DC | PRN
  Administered 2019-07-11: 250 ug via INTRAVENOUS

## 2019-07-11 MED ORDER — REMIFENTANIL HCL 1 MG IV SOLR
INTRAVENOUS | Status: AC
  Filled 2019-07-11: qty 1000

## 2019-07-11 MED ORDER — SUCCINYLCHOLINE CHLORIDE 20 MG/ML IJ SOLN
INTRAMUSCULAR | Status: AC
  Filled 2019-07-11: qty 1

## 2019-07-11 MED ORDER — MIDAZOLAM HCL 2 MG/2ML IJ SOLN
INTRAMUSCULAR | Status: AC
  Filled 2019-07-11: qty 2

## 2019-07-11 MED ORDER — NALOXONE HCL 0.4 MG/ML IJ SOLN
INTRAMUSCULAR | Status: AC
  Filled 2019-07-11: qty 1

## 2019-07-11 NOTE — Progress Notes (Signed)
Patient ID: Troy Fox, male   DOB: 12/26/1970, 48 y.o.   MRN: [id number] ECT: Patient had bilateral ECT this morning.  The treatment itself per se had no complications with it.  Patient clearly seems to be deteriorating however.  He has very little responsiveness, can no longer respond to questions about his name, not able to really even physically assist with basic hygiene.  Last time we did ECT there was significant improvement in the first 2 treatments.  This is #3 and we are really not seeing anything get better.  I had ordered an x-ray this morning after treatment which showed a little pulmonary edema as I understood it.  We had been running some fluid because he looked dry earlier but stopped it after seeing the x-ray.  Patient keeps pulling his NG tube out which I know is making it very difficult for management on the unit.  I spoke with his father and mother tonight by telephone and updated them on the situation.  It is my recommendation that we not continue ECT at this point.  Father asked who he would talk to about making decisions such as skilled nursing facility and I advised him to talk to the primary team on the ward.  I am happy to continue to be involved if I can be of any help.  However at this point I am not going to make any changes to his medicine or continue ECT for now.

## 2019-07-11 NOTE — Progress Notes (Addendum)
[place] at [place] NAME: Troy Fox    MR#:  [id number]  DATE OF BIRTH:  November 24, 1970  SUBJECTIVE:   Patient continues to be sleepy.  Opens eyes briefly to voice, but does not speak or follow commands.  He apparently pulled out his NG tube yesterday and was subsequently replaced.  REVIEW OF SYSTEMS:  limited due to psychiatric illness  DRUG ALLERGIES:   Allergies  Allergen Reactions  . Aripiprazole Other (See Comments)    Causes gambling addiction  *abilify     VITALS:  Blood pressure 114/81, pulse (!) 110, temperature 97.9 F (36.6 C), resp. rate 19, height 5\' 5"  (1.651 m), weight 62.9 kg, SpO2 94 %.  PHYSICAL EXAMINATION:  GENERAL:  48 y.o.-year-old patient lying in the bed with no acute distress.  EYES: Pupils equal, round, reactive to light and accommodation. No scleral icterus. Extraocular muscles intact.  HEENT: Head atraumatic, normocephalic. Oropharynx and nasopharynx clear. NG tube in place. NECK:  Supple, no jugular venous distention. No thyroid enlargement, no tenderness.  LUNGS: Normal breath sounds bilaterally, no wheezing, rales,rhonchi or crepitation. No use of accessory muscles of respiration.  CARDIOVASCULAR: RRR, S1, S2 normal. No murmurs, rubs, or gallops.  ABDOMEN: Soft, nontender, nondistended. Bowel sounds present. No organomegaly or mass.  EXTREMITIES: No pedal edema, cyanosis, or clubbing.  NEUROLOGIC: Opens eyes to voice, but does not follow commands or speak. PSYCHIATRIC: Sleepy.  Does not answer questions of orientation. SKIN: No obvious rash, lesion, or ulcer.    LABORATORY PANEL:   CBC Recent Labs  Lab 07/11/19 0535  WBC 4.7  HGB 13.6  HCT 39.7  PLT 158   ------------------------------------------------------------------------------------------------------------------  Chemistries  Recent Labs  Lab 07/10/19 0553 07/11/19 0535  NA 142 142  K 3.3* 3.4*  CL 106 105  CO2 30 27   GLUCOSE 121* 105*  BUN 10 9  CREATININE 0.57* 0.53*  CALCIUM 9.2 9.0  MG 2.1  --    ------------------------------------------------------------------------------------------------------------------  Cardiac Enzymes No results for input(s): TROPONINI in the last 168 hours. ------------------------------------------------------------------------------------------------------------------  RADIOLOGY:  Dg Chest Port 1 View  Result Date: 07/11/2019 CLINICAL DATA:  Cough. EXAM: PORTABLE CHEST 1 VIEW COMPARISON:  Two-view chest x-ray 07/01/2019 FINDINGS: The heart size is normal. Mild pulmonary vascular congestion is new. Focal right upper lobe density is noted. This was present in the prior study. Increased interstitial markings are noted, likely related to edema. No other significant consolidation is present. NG tube is in place. Bowel gas pattern is normal. IMPRESSION: 1. Ill-defined right upper lobe density. While this could represent airspace disease, it raises concern for a nodule. Recommend CT of the chest with contrast for further evaluation. 2. New mild pulmonary vascular congestion and interstitial edema. This may be related to heart disease or reactive to a viral infection. 3. Normal bowel gas pattern. 4. The support apparatus is stable. Electronically Signed   By: San Morelle M.D.   On: 07/11/2019 11:25   Dg Abd Portable 1v  Result Date: 07/10/2019 CLINICAL DATA:  NG tube placement. EXAM: PORTABLE ABDOMEN - 1 VIEW COMPARISON:  07/07/2019 FINDINGS: The patient is rotated to the right. An enteric tube has been advanced in the interim and now terminates in the right upper quadrant, likely in the distal stomach. No dilated loops of bowel are seen to suggest obstruction. Scattered high density material is noted in colonic diverticula. The included lung bases are clear. IMPRESSION: Enteric tube likely in the distal stomach.  Electronically Signed   By: Sebastian Ache M.D.   On: 07/10/2019  17:46    EKG:   Orders placed or performed during the hospital encounter of 07/01/19  . ED EKG  . ED EKG  . ED EKG  . ED EKG    ASSESSMENT AND PLAN:   Dysphagia- likely due to underlying psychiatric disorder vs tardive dyskinesia secondary to psychiatric medicines -Recent EGD 06/15/2019 with normal esophagus and some gastritis -Recent barium swallow with normal esophagus and a paraesophageal hernia -GI has signed off -Seen by ENT, who did not feel that he had any pharyngeal impediment to swallowing -Seen by SLP -Continue feeds via NG tube  Catatonic schizophrenia -Underwent electroconvulsive therapy on 10/23 and 10/26 -Psychiatry is planning for third ECT treatment today -Continue Prozac -Continue ativan 1 mg every 6 hours -Zyprexa IM prn agitation  Sinus tachycardia- HR remains elevated. -Receiving LR bolus at ECT today -Check TSH -Cardiac monitoring  Hypokalemia -Replete and recheck  GERD -Continue PPI  Mild malnutrition -Continue tube feeds  DVT prophylaxis- Lovenox  Updated father, Siri Cole, over the phone.  CODE STATUS: Full code  TOTAL TIME TAKING CARE OF THIS PATIENT: 32 minutes.   Note: This dictation was prepared with Dragon dictation along with smaller phrase technology. Any transcriptional errors that result from this process are unintentional.   Hilton Sinclair M.D on 07/11/2019 at 11:55 AM  Between 7am to 6pm - Pager - [phone]  After 6pm go to [email or web address] - password [email or web address]  [facility]  [phone]  CC: Primary care physician; Benita Stabile, MD

## 2019-07-11 NOTE — H&P (Signed)
Troy Fox is an 48 y.o. male.   Chief Complaint: not offering any complaint today HPI: catatonic schizoaffective. Today Mental status is worse than it was last evening. Eyes closed. Not communicating.  Past Medical History:  Diagnosis Date  . Bipolar 1 disorder ([place])   . 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[place])   . GERD (gastroesophageal reflux disease)   . Nonverbal   . Schizoaffective disorder ([place])   . Tardive dyskinesia     Past Surgical History:  Procedure Laterality Date  . BIOPSY  06/15/2019   Procedure: BIOPSY;  Surgeon: Rogene Houston, MD;  [facility];  Service: Endoscopy;;  gastric  . ESOPHAGOGASTRODUODENOSCOPY (EGD) WITH PROPOFOL N/A 06/15/2019   Procedure: ESOPHAGOGASTRODUODENOSCOPY (EGD) WITH PROPOFOL;  Surgeon: Rogene Houston, MD;  [facility];  Service: Endoscopy;  Laterality: N/A;  7:30    Family History  Adopted: Yes  Family history unknown: Yes   Social History:  reports that he has never smoked. He has never used smokeless tobacco. He reports that he does not drink alcohol or use drugs.  Allergies:  Allergies  Allergen Reactions  . Aripiprazole Other (See Comments)    Causes gambling addiction  *abilify     Medications Prior to Admission  Medication Sig Dispense Refill  . acetaminophen (TYLENOL) 500 MG tablet Take 1,000 mg by mouth every 8 (eight) hours as needed for moderate pain.    Marland Kitchen alum & mag hydroxide-simeth (MYLANTA) 017-793-90 MG/5ML suspension Take 30 mLs by mouth every 6 (six) hours as needed for indigestion or heartburn.    . cyanocobalamin 1000 MCG tablet Take 1 tablet (1,000 mcg total) by mouth daily. 30 tablet 1  . dicyclomine (BENTYL) 20 MG tablet Take 1 tablet (20 mg total) by mouth 2 (two) times daily as needed for spasms. 20 tablet 0  . FLUoxetine (PROZAC) 40 MG capsule TAKE 1 CAPSULE BY MOUTH DAILY. (Patient taking differently: Take 40 mg by mouth daily. ) 30 capsule 0  . OLANZapine (ZYPREXA) 10 MG tablet Take 10 mg  by mouth at bedtime.     Marland Kitchen omeprazole (PRILOSEC) 40 MG capsule Take 40 mg by mouth daily.    . traZODone (DESYREL) 100 MG tablet Take 1 tablet (100 mg total) by mouth at bedtime. 30 tablet 1  . Valbenazine Tosylate (INGREZZA) 80 MG CAPS Take 30 mg by mouth daily. (Patient taking differently: Take 80 mg by mouth daily. ) 30 capsule 0    Results for orders placed or performed during the hospital encounter of 07/01/19 (from the past 48 hour(s))  Glucose, capillary     Status: Abnormal   Collection Time: 07/09/19 10:53 AM  Result Value Ref Range   Glucose-Capillary 121 (H) 70 - 99 mg/dL  Glucose, capillary     Status: Abnormal   Collection Time: 07/09/19  7:53 PM  Result Value Ref Range   Glucose-Capillary 107 (H) 70 - 99 mg/dL   Comment 1 Notify RN   Basic metabolic panel     Status: Abnormal   Collection Time: 07/10/19  5:53 AM  Result Value Ref Range   Sodium 142 135 - 145 mmol/L   Potassium 3.3 (L) 3.5 - 5.1 mmol/L   Chloride 106 98 - 111 mmol/L   CO2 30 22 - 32 mmol/L   Glucose, Bld 121 (H) 70 - 99 mg/dL   BUN 10 6 - 20 mg/dL   Creatinine, Ser 0.57 (L) 0.61 - 1.24 mg/dL   Calcium 9.2 8.9 - 10.3 mg/dL  GFR calc non Af Amer >60 >60 mL/min   GFR calc Af Amer >60 >60 mL/min   Anion gap 6 5 - 15    Comment: Performed at [facility], [address] [place], [place] [address]  Magnesium     Status: None   Collection Time: 07/10/19  5:53 AM  Result Value Ref Range   Magnesium 2.1 1.7 - 2.4 mg/dL    Comment: Performed at [facility], [address] [place], [place] [address]  Phosphorus     Status: None   Collection Time: 07/10/19  5:53 AM  Result Value Ref Range   Phosphorus 3.1 2.5 - 4.6 mg/dL    Comment: Performed at [facility], [address] [place], [place] [address]  Glucose, capillary     Status: Abnormal   Collection Time: 07/10/19  7:57 AM  Result Value Ref Range   Glucose-Capillary 108 (H) 70 - 99 mg/dL  Glucose, capillary      Status: Abnormal   Collection Time: 07/10/19 11:34 AM  Result Value Ref Range   Glucose-Capillary 122 (H) 70 - 99 mg/dL  Glucose, capillary     Status: Abnormal   Collection Time: 07/10/19  5:19 PM  Result Value Ref Range   Glucose-Capillary 113 (H) 70 - 99 mg/dL  Glucose, capillary     Status: Abnormal   Collection Time: 07/10/19  7:31 PM  Result Value Ref Range   Glucose-Capillary 126 (H) 70 - 99 mg/dL  Glucose, capillary     Status: Abnormal   Collection Time: 07/11/19 12:35 AM  Result Value Ref Range   Glucose-Capillary 100 (H) 70 - 99 mg/dL  Basic metabolic panel     Status: Abnormal   Collection Time: 07/11/19  5:35 AM  Result Value Ref Range   Sodium 142 135 - 145 mmol/L   Potassium 3.4 (L) 3.5 - 5.1 mmol/L   Chloride 105 98 - 111 mmol/L   CO2 27 22 - 32 mmol/L   Glucose, Bld 105 (H) 70 - 99 mg/dL   BUN 9 6 - 20 mg/dL   Creatinine, Ser 9.140.53 (L) 0.61 - 1.24 mg/dL   Calcium 9.0 8.9 - 78.210.3 mg/dL   GFR calc non Af Amer >60 >60 mL/min   GFR calc Af Amer >60 >60 mL/min   Anion gap 10 5 - 15    Comment: Performed at [facility], [address] [place], [place] [address]  CBC     Status: None   Collection Time: 07/11/19  5:35 AM  Result Value Ref Range   WBC 4.7 4.0 - 10.5 K/uL   RBC 4.47 4.22 - 5.81 MIL/uL   Hemoglobin 13.6 13.0 - 17.0 g/dL   HCT 30.839.7 65.739.0 - 84.652.0 %   MCV 88.8 80.0 - 100.0 fL   MCH 30.4 26.0 - 34.0 pg   MCHC 34.3 30.0 - 36.0 g/dL   RDW 96.213.7 95.211.5 - 84.115.5 %   Platelets 158 150 - 400 K/uL   nRBC 0.0 0.0 - 0.2 %    Comment: Performed at [facility], [address] [place], [place] [address]  Glucose, capillary     Status: None   Collection Time: 07/11/19  8:26 AM  Result Value Ref Range   Glucose-Capillary 80 70 - 99 mg/dL   Dg Abd Portable 1v  Result Date: 07/10/2019 CLINICAL DATA:  NG tube placement. EXAM: PORTABLE ABDOMEN - 1 VIEW COMPARISON:  07/07/2019 FINDINGS: The patient is rotated to the right.  An enteric tube has been  advanced in the interim and now terminates in the right upper quadrant, likely in the distal stomach. No dilated loops of bowel are seen to suggest obstruction. Scattered high density material is noted in colonic diverticula. The included lung bases are clear. IMPRESSION: Enteric tube likely in the distal stomach. Electronically Signed   By: Sebastian Ache M.D.   On: 07/10/2019 17:46    Review of Systems  Unable to perform ROS: Mental status change    Blood pressure (!) 158/81, pulse (!) 108, temperature 99.5 F (37.5 C), temperature source Oral, resp. rate 18, height 5\' 5"  (1.651 m), weight 62.9 kg, SpO2 93 %. Physical Exam  Nursing note and vitals reviewed. Constitutional: He appears well-developed and well-nourished.  HENT:  Head: Normocephalic and atraumatic.  Eyes: Pupils are equal, round, and reactive to light. Conjunctivae are normal.  Neck: Normal range of motion.  Cardiovascular: Regular rhythm and normal heart sounds.  Respiratory: Effort normal. No respiratory distress.  Cough sounding more upper resp in origin. Patient look very dry in membranes. BP up and down but about 150/90 for . Lungs sounded nml  GI: Soft.  Musculoskeletal: Normal range of motion.  Neurological: He is alert.  Skin: Skin is warm and dry.  Psychiatric: His affect is blunt. He is noncommunicative.     Assessment/Plan Giving bolus LR due to evident dryness and anticipated hypotention if we use remifentanil. Will get CXR after treatment  [place], MD 07/11/2019, 10:06 AM

## 2019-07-11 NOTE — Progress Notes (Signed)
Patient to ECT. Madlyn Frankel, RN

## 2019-07-11 NOTE — Progress Notes (Signed)
LR 1 liter finishing up from OR, NG tube stopper stuck in end of NG, Or staff unable to get out as well as Freight forwarder , DR Penwarden  Made aware of BP and HR elevated, will observe at this time

## 2019-07-11 NOTE — Transfer of Care (Signed)
Immediate Anesthesia Transfer of Care Note  Patient: Troy Fox  Procedure(s) Performed: ECT TX  Patient Location: PACU  Anesthesia Type:General  Level of Consciousness: sedated  Airway & Oxygen Therapy: Patient Spontanous Breathing and Patient connected to face mask oxygen  Post-op Assessment: Report given to RN and Post -op Vital signs reviewed and stable  Post vital signs: Reviewed and stable  Last Vitals:  Vitals Value Taken Time  BP    Temp    Pulse 131 07/11/19 1101  Resp 14 07/11/19 1101  SpO2 95 % 07/11/19 1101  Vitals shown include unvalidated device data.  Last Pain:  Vitals:   07/11/19 0946  TempSrc: Oral  PainSc:       Patients Stated Pain Goal: 0 (31/51/76 1607)  Complications: No apparent anesthesia complications

## 2019-07-11 NOTE — Anesthesia Procedure Notes (Signed)
Date/Time: 07/11/2019 10:41 AM Performed by: Dionne Bucy, CRNA Pre-anesthesia Checklist: Patient identified, Emergency Drugs available, Suction available and Patient being monitored Patient Re-evaluated:Patient Re-evaluated prior to induction Oxygen Delivery Method: Circle system utilized Preoxygenation: Pre-oxygenation with 100% oxygen Induction Type: IV induction Ventilation: Mask ventilation without difficulty and Mask ventilation throughout procedure Airway Equipment and Method: Bite block Placement Confirmation: positive ETCO2 Dental Injury: Teeth and Oropharynx as per pre-operative assessment

## 2019-07-11 NOTE — Progress Notes (Signed)
Ng tube dced and new one placed, positive air bolus heard and stomach contents returned

## 2019-07-11 NOTE — Progress Notes (Signed)
Patient able to tolerate small sips from spoon of nectar thick liquids. Madlyn Frankel, RN

## 2019-07-11 NOTE — Anesthesia Postprocedure Evaluation (Signed)
Anesthesia Post Note  Patient: Troy Fox  Procedure(s) Performed: ECT TX  Patient location during evaluation: PACU Anesthesia Type: General Level of consciousness: awake and alert Pain management: pain level controlled Vital Signs Assessment: post-procedure vital signs reviewed and stable Respiratory status: spontaneous breathing, nonlabored ventilation and respiratory function stable Cardiovascular status: blood pressure returned to baseline and stable Postop Assessment: no signs of nausea or vomiting Anesthetic complications: no     Last Vitals:  Vitals:   07/11/19 1145 07/11/19 1155  BP: 114/81 (!) 128/93  Pulse: (!) 110   Resp: 19   Temp: 36.6 C   SpO2: 94%     Last Pain:  Vitals:   07/11/19 0946  TempSrc: Oral  PainSc:                  [name]

## 2019-07-11 NOTE — Care Management Important Message (Signed)
Important Message  Patient Details  Name: Troy Fox MRN: [id number] Date of Birth: 01/15/1971   Medicare Important Message Given:  Yes     Juliann Pulse A Oluwatoni Rotunno 07/11/2019, 10:27 AM

## 2019-07-11 NOTE — Anesthesia Post-op Follow-up Note (Signed)
Anesthesia QCDR form completed.        

## 2019-07-11 NOTE — Progress Notes (Signed)
PT Cancellation Note  Patient Details Name: Merrell Borsuk MRN: [id number] DOB: [birth year]   Cancelled Treatment:    Reason Eval/Treat Not Completed: Patient at procedure or test/unavailable. Pt currently off the floor at procedure and not available. PT will follow up as able and appropriate.   Zachary George PT, DPT 12:17 PM,07/11/19 [phone]

## 2019-07-11 NOTE — Anesthesia Preprocedure Evaluation (Signed)
Anesthesia Evaluation  Patient identified by MRN, date of birth, ID band Patient awake  General Assessment Comment:Review limited to chart review, limited verbal communication  Reviewed: Allergy & Precautions, NPO status , Patient's Chart, lab work & pertinent test results  History of Anesthesia Complications Negative for: history of anesthetic complications  Airway Mallampati: II  TM Distance: >3 FB Neck ROM: Full    Dental  (+) Poor Dentition   Pulmonary neg pulmonary ROS, neg sleep apnea, neg COPD,    breath sounds clear to auscultation- rhonchi (-) wheezing      Cardiovascular (-) hypertension(-) CAD, (-) Past MI, (-) Cardiac Stents and (-) CABG  Rhythm:Regular Rate:Normal - Systolic murmurs and - Diastolic murmurs    Neuro/Psych neg Seizures PSYCHIATRIC DISORDERS Depression Bipolar Disorder Schizophrenia negative neurological ROS     GI/Hepatic Neg liver ROS, hiatal hernia, GERD  ,  Endo/Other  negative endocrine ROSneg diabetes  Renal/GU negative Renal ROS     Musculoskeletal negative musculoskeletal ROS (+)   Abdominal (+) - obese,   Peds  Hematology negative hematology ROS (+)   Anesthesia Other Findings Past Medical History: No date: Bipolar 1 disorder ([facility]) No date: 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[facility]) No date: GERD (gastroesophageal reflux disease) No date: Nonverbal No date: Schizoaffective disorder ([facility]) No date: Tardive dyskinesia   Reproductive/Obstetrics                             Anesthesia Physical  Anesthesia Plan  ASA: II  Anesthesia Plan: General   Post-op Pain Management:    Induction: Intravenous  PONV Risk Score and Plan: 1 and Ondansetron  Airway Management Planned: Mask  Additional Equipment:   Intra-op Plan:   Post-operative Plan:   Informed Consent: I have reviewed the patients History and Physical, chart, labs and discussed the procedure including  the risks, benefits and alternatives for the proposed anesthesia with the patient or authorized representative who has indicated his/her understanding and acceptance.     Dental advisory given  Plan Discussed with: CRNA and Anesthesiologist  Anesthesia Plan Comments:         Anesthesia Quick Evaluation  

## 2019-07-11 NOTE — Procedures (Signed)
ECT SERVICES Physician's Interval Evaluation & Treatment Note  Patient Identification: Troy Fox MRN:  [id number] Date of Evaluation:  07/11/2019 TX #: 3  MADRS:   MMSE:   P.E. Findings:  Patient is even more withdrawn.  Has some gurgling in his breathing.  Looks dehydrated around his mucous membranes.  Psychiatric Interval Note:  Hardly any responsiveness nothing coherent  Subjective:  Patient is a 48 y.o. male seen for evaluation for Electroconvulsive Therapy. Patient not able to offer complaint  Treatment Summary:   []   Right Unilateral             [x]  Bilateral   % Energy : 1.0 ms 100%   Impedance: 1870 ohms  Seizure Energy Index: 3780 V squared  Postictal Suppression Index: 39%  Seizure Concordance Index: 95%  Medications  Pre Shock: Remifentanil 250 mcg succinylcholine 90 mg  Post Shock: Narcan 2 mg  Seizure Duration: 6 seconds EMG the computer says 19 seconds EEG but I think that is not correct and that the EEG seizure is closer to the 6 to 10 seconds.   Comments: It seems like the patient is really at a point where there are is not a way to get him to have an effective seizure.  I do not have an anesthetic that I can go to that would be more effective in promoting seizures than the remifentanil.  Additionally it seems like he is getting if anything more cognitively impaired and more sick.  I will give the family a call and speak with them but I am worried that it may not be a good idea to continue any further ECT.  Lungs:  [x]   Clear to auscultation               []  Other:   Heart:    [x]   Regular rhythm             []  irregular rhythm    [x]   Previous H&P reviewed, patient examined and there are NO CHANGES                 []   Previous H&P reviewed, patient examined and there are changes noted.   Alethia Berthold, MD 10/28/20206:21 PM

## 2019-07-12 ENCOUNTER — Inpatient Hospital Stay: Payer: Medicare Other

## 2019-07-12 LAB — BASIC METABOLIC PANEL
Anion gap: 8 (ref 5–15)
BUN: 7 mg/dL (ref 6–20)
CO2: 28 mmol/L (ref 22–32)
Calcium: 9.5 mg/dL (ref 8.9–10.3)
Chloride: 105 mmol/L (ref 98–111)
Creatinine, Ser: 0.49 mg/dL — ABNORMAL LOW (ref 0.61–1.24)
GFR calc Af Amer: >60 mL/min (ref >60)
GFR calc non Af Amer: >60 mL/min (ref >60)
Glucose, Bld: 89 mg/dL (ref 70–99)
Potassium: 3.5 mmol/L (ref 3.5–5.1)
Sodium: 141 mmol/L (ref 135–145)

## 2019-07-12 LAB — GLUCOSE, CAPILLARY: Glucose-Capillary: 102 mg/dL — ABNORMAL HIGH (ref 70–99)

## 2019-07-12 LAB — PROCALCITONIN: Procalcitonin: <0.1 ng/mL

## 2019-07-12 LAB — TSH: TSH: 0.632 u[IU]/mL (ref 0.350–4.500)

## 2019-07-12 MED ORDER — POTASSIUM CHLORIDE 10 MEQ/100ML IV SOLN
10.0000 meq | INTRAVENOUS | Status: AC
  Administered 2019-07-12 (×2): 10 meq via INTRAVENOUS
  Filled 2019-07-12 (×2): qty 100

## 2019-07-12 MED ORDER — HALOPERIDOL LACTATE 5 MG/ML IJ SOLN
5.0000 mg | Freq: Once | INTRAMUSCULAR | Status: AC
  Administered 2019-07-12: 5 mg via INTRAVENOUS
  Filled 2019-07-12: qty 1

## 2019-07-12 NOTE — Evaluation (Signed)
Objective Swallowing Evaluation: Type of Study: MBS-Modified Barium Swallow Study   Patient Details  Name: Troy DuhamelStephen Fox MRN: [id number] Date of Birth: [birth year]  Today's Date: 07/12/2019 Time: SLP Start Time (ACUTE ONLY): 1010 -SLP Stop Time (ACUTE ONLY): 1110  SLP Time Calculation (min) (ACUTE ONLY): 60 min   Past Medical History:  Past Medical History:  Diagnosis Date  . Bipolar 1 disorder ([facility])   . 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 GERD (gastroesophageal reflux disease)   . Nonverbal   . Schizoaffective disorder ([facility])   . Tardive dyskinesia    Past Surgical History:  Past Surgical History:  Procedure Laterality Date  . BIOPSY  06/15/2019   Procedure: BIOPSY;  Surgeon: Malissa Hippoehman, Najeeb U, MD;  [facility];  Service: Endoscopy;;  gastric  . ESOPHAGOGASTRODUODENOSCOPY (EGD) WITH PROPOFOL N/A 06/15/2019   Procedure: ESOPHAGOGASTRODUODENOSCOPY (EGD) WITH PROPOFOL;  Surgeon: Malissa Hippoehman, Najeeb U, MD;  [facility];  Service: Endoscopy;  Laterality: N/A;  7:30   HPI: Pt is a 48 y.o. male with bipolar 1 dis, catatonia schizophrenia, tardive dyskinesia, GERD, and a MOD hiatal hernia, back pain who presents to the ED with tremors, worsening tardive dyskinesia again per report.  Patient had increased tremors in the past 3 weeks, even some trouble chewing and swallowing food.  Patient had a upper GI study that showed normal esophagus with MODERATE size paraesophageal hernia.  Patient is father noted that he has been having increased drooling and shaking episodes in the past 3 weeks even unable to swallow his own saliva.  Patient was seen here on 10/5 and got Benadryl and Ativan and was better but but then has progressively getting worse over the past 3 weeks and not eating very well.  He has been having to grind of his food and do shakes.  Pt is able to walk w/ a shuffling gait; recent falls.  Per chart notes, pt's Father stated that pt's "Tardive Dyskinesia is getting bad and he  could not care for him anymore". He is hoping that he can be seen again by Dr. Toni Amendlapacs for ECT.  He reports pt having symptoms of depression and not wanting to eat, he has lost about 10 pounds(noted dx of Mild Malnutrition in PMH baseline).  Pt has been followed by Psychiatry and Palliative Care svcs during the admission.    Subjective: pt sitting in mbss chair; he appeared stiff, min tremorous. Slow motor movements. Minimal engagement w/ SLP; minimal verbal engagement but he followed instructions from SLP w/ cues.     Assessment / Plan / Recommendation  CHL IP CLINICAL IMPRESSIONS 07/12/2019  Clinical Impression Pt appears to present w/ Moderate-Severe oropharyngeal phase dysphagia w/ increased risk for aspiration during/after the swallow as well as increased risk for not being able to meet nutrition/hydration needs adequately. Pt requires Full Support and Moderate verbal/tactile cues to follow through w/ task of swallowing, oral clearing. During the oral phase, pt exhibited lengthy oral phase time; oral holding. Decreased oral awareness for bolus control and A-P transfer noted; piecemealing of bolus as small amounts spilled prematurely into the pharynx. Oral residue remained post multiple swallows. Pt appeared to present w/ poor coordination of bolus management and manipulation for timely transfer of trial consistencies given. This presentation can increase risk for choking/aspiration if bolus material spills into pharynx unexpectedly. Any increased oral residue spilling into the pharynx can post similar issue. During the pharyngeal phase, pt appeared to present w/ delay in pharyngeal swallow initiation w/ nectar consistency  liquids and puree spilling from the Valleculae during pharyngeal swallow initiation. Pt was able to attain adequaite airway closure during the swallow w/ No laryngeal penetration or aspiration occurring. However, Mod bolus residue remained post initial swallow indicating decreased  laryngeal excursion and pharyngeal pressure during the swallow. Also noted BOT residue indicating reduced posterior strength and contact along pharyngeal wall. This residue reduced as pt utilized f/u, Dry swallows but did not clear completely. Alternating foods/liquids was not helpful as a strategy. During the Cervical Esophageal phase (viewable), no dysmotility was noted.   SLP Visit Diagnosis Dysphagia, oropharyngeal phase (R13.12)  Attention and concentration deficit following --  Frontal lobe and executive function deficit following --  Impact on safety and function Moderate aspiration risk;Risk for inadequate nutrition/hydration      CHL IP TREATMENT RECOMMENDATION 07/12/2019  Treatment Recommendations Therapy as outlined in treatment plan below     Prognosis 07/12/2019  Prognosis for Safe Diet Advancement Guarded  Barriers to Reach Goals Cognitive deficits;Language deficits;Time post onset;Severity of deficits;Behavior  Barriers/Prognosis Comment --    CHL IP DIET RECOMMENDATION 07/12/2019  SLP Diet Recommendations Dysphagia 1 (Puree) solids;Nectar thick liquid  Liquid Administration via Spoon;No straw  Medication Administration Crushed with puree  Compensations Minimize environmental distractions;Lingual sweep for clearance of pocketing;Small sips/bites;Slow rate;Multiple dry swallows after each bite/sip;Follow solids with liquid  Postural Changes Remain semi-upright after after feeds/meals (Comment);Seated upright at 90 degrees      CHL IP OTHER RECOMMENDATIONS 07/12/2019  Recommended Consults (No Data)  Oral Care Recommendations Oral care BID;Oral care before and after PO;Staff/trained caregiver to provide oral care  Other Recommendations Order thickener from pharmacy;Prohibited food (jello, ice cream, thin soups);Remove water pitcher;Have oral suction available      CHL IP FOLLOW UP RECOMMENDATIONS 07/12/2019  Follow up Recommendations Skilled Nursing facility      [facility] IP  FREQUENCY AND DURATION 07/12/2019  Speech Therapy Frequency (ACUTE ONLY) min 2x/week  Treatment Duration 2 weeks           CHL IP ORAL PHASE 07/12/2019  Oral Phase Impaired  Oral - Pudding Teaspoon --  Oral - Pudding Cup --  Oral - Honey Teaspoon --  Oral - Honey Cup --  Oral - Nectar Teaspoon --  Oral - Nectar Cup --  Oral - Nectar Straw --  Oral - Thin Teaspoon --  Oral - Thin Cup --  Oral - Thin Straw --  Oral - Puree --  Oral - Mech Soft --  Oral - Regular --  Oral - Multi-Consistency --  Oral - Pill --  Oral Phase - Comment pt exhibited lengthy oral phase time; oral holding. Decreased oral awareness for bolus control and A-P transfer noted; piecemealing of bolus as small amounts spilled prematurely into the pharynx. Oral residue remained post multiple swallows. Pt appeared to present w/ poor coordination of bolus management and manipulation for timely transfer of trial consistencies given. This presentation can increase risk for choking/aspiration if bolus material spills into pharynx unexpectedly. Any increased oral residue spilling into the pharynx can post similar issue.    CHL IP PHARYNGEAL PHASE 07/12/2019  Pharyngeal Phase Impaired  Pharyngeal- Pudding Teaspoon --  Pharyngeal --  Pharyngeal- Pudding Cup --  Pharyngeal --  Pharyngeal- Honey Teaspoon --  Pharyngeal --  Pharyngeal- Honey Cup --  Pharyngeal --  Pharyngeal- Nectar Teaspoon --  Pharyngeal --  Pharyngeal- Nectar Cup --  Pharyngeal --  Pharyngeal- Nectar Straw --  Pharyngeal --  Pharyngeal- Thin Teaspoon --  Pharyngeal --  Pharyngeal- Thin Cup --  Pharyngeal --  Pharyngeal- Thin Straw --  Pharyngeal --  Pharyngeal- Puree --  Pharyngeal --  Pharyngeal- Mechanical Soft --  Pharyngeal --  Pharyngeal- Regular --  Pharyngeal --  Pharyngeal- Multi-consistency --  Pharyngeal --  Pharyngeal- Pill --  Pharyngeal --  Pharyngeal Comment pt appeared to present w/ delay in pharyngeal swallow initiation  w/ nectar consistency liquids and puree spilling from the Valleculae during pharyngeal swallow initiation. Pt was able to attain adequaite airway closure during the swallow w/ No laryngeal penetration or aspiration occurring. However, Mod bolus residue remained post initial swallow indicating decreased laryngeal excursion and pharyngeal pressure during the swallow. Also noted BOT residue indicating reduced posterior strength and contact along pharyngeal wall. This residue reduced as pt utilized f/u, Dry swallows but did not clear completely. Alternating foods/liquids was not helpful as a strategy.      CHL IP CERVICAL ESOPHAGEAL PHASE 07/12/2019  Cervical Esophageal Phase WFL  Pudding Teaspoon --  Pudding Cup --  Honey Teaspoon --  Honey Cup --  Nectar Teaspoon --  Nectar Cup --  Nectar Straw --  Thin Teaspoon --  Thin Cup --  Thin Straw --  Puree --  Mechanical Soft --  Regular --  Multi-consistency --  Pill --  Cervical Esophageal Comment --        Troy Som, MS, CCC-SLP Troy Fox 07/12/2019, 3:16 PM

## 2019-07-12 NOTE — Plan of Care (Signed)
PMT: Patient is sleeping soundly. Attempted to reach his father who is listed as POA. VM left.

## 2019-07-12 NOTE — TOC Progression Note (Signed)
Transition of Care [facility]) - Progression Note    Patient Details  Name: Troy Fox MRN: [id number] Date of Birth: 07/09/1971  Transition of Care [facility]) CM/SW [place], LCSW Phone Number: 07/12/2019, 9:40 AM  Clinical Narrative: PASARR obtained: [phone] F. Expires 10/08/2019.    Expected Discharge Plan: [place]    Expected Discharge Plan and Services Expected Discharge Plan: [place] arrangements for the past 2 months: Single Family Home                                       Social Determinants of Health (SDOH) Interventions    Readmission Risk Interventions No flowsheet data found.

## 2019-07-12 NOTE — Progress Notes (Signed)
[place] [facility] [place] NAME: Troy Fox    MR#:  [id number]  DATE OF BIRTH:  1970/11/16  SUBJECTIVE:   Patient is more alert this morning.  He is answering some questions.  He denies any concerns today.  He was able to take some small sips of nectar thick liquids by mouth.  REVIEW OF SYSTEMS:  limited due to psychiatric illness  DRUG ALLERGIES:   Allergies  Allergen Reactions  . Aripiprazole Other (See Comments)    Causes gambling addiction  *abilify     VITALS:  Blood pressure 91/69, pulse 87, temperature 98.2 F (36.8 C), temperature source Axillary, resp. rate 17, height 5\' 5"  (1.651 m), weight 61.9 kg, SpO2 100 %.  PHYSICAL EXAMINATION:  GENERAL:  48 y.o.-year-old patient lying in the bed with no acute distress.  EYES: Pupils equal, round, reactive to light and accommodation. No scleral icterus. Extraocular muscles intact.  HEENT: Head atraumatic, normocephalic. Oropharynx and nasopharynx clear. NG tube in place. NECK:  Supple, no jugular venous distention. No thyroid enlargement, no tenderness.  LUNGS: Normal breath sounds bilaterally, no wheezing, rales,rhonchi or crepitation. No use of accessory muscles of respiration.  CARDIOVASCULAR: RRR, S1, S2 normal. No murmurs, rubs, or gallops.  ABDOMEN: Soft, nontender, nondistended. Bowel sounds present. No organomegaly or mass.  EXTREMITIES: No pedal edema, cyanosis, or clubbing.  NEUROLOGIC: Opens eyes to voice, but does not follow commands or speak. PSYCHIATRIC: Sleepy.  Does not answer questions of orientation. SKIN: No obvious rash, lesion, or ulcer.    LABORATORY PANEL:   CBC Recent Labs  Lab 07/11/19 0535  WBC 4.7  HGB 13.6  HCT 39.7  PLT 158   ------------------------------------------------------------------------------------------------------------------  Chemistries  Recent Labs  Lab 07/10/19 0553  07/12/19 0333  NA 142   < > 141  K 3.3*   < > 3.5   CL 106   < > 105  CO2 30   < > 28  GLUCOSE 121*   < > 89  BUN 10   < > 7  CREATININE 0.57*   < > 0.49*  CALCIUM 9.2   < > 9.5  MG 2.1  --   --    < > = values in this interval not displayed.   ------------------------------------------------------------------------------------------------------------------  Cardiac Enzymes No results for input(s): TROPONINI in the last 168 hours. ------------------------------------------------------------------------------------------------------------------  RADIOLOGY:  Dg Abd 1 View  Result Date: 07/11/2019 CLINICAL DATA:  NG tube placement. EXAM: ABDOMEN - 1 VIEW COMPARISON:  07/10/2019 FINDINGS: NG tube tip now appears to be in the third portion of the duodenum. Bowel gas pattern is normal. No acute bone abnormality. Accentuated lung markings, stable. IMPRESSION: NG tube in the region of the third portion of the duodenum. Electronically Signed   By: Lorriane Shire M.D.   On: 07/11/2019 13:52   Dg Chest Port 1 View  Result Date: 07/11/2019 CLINICAL DATA:  Cough. EXAM: PORTABLE CHEST 1 VIEW COMPARISON:  Two-view chest x-ray 07/01/2019 FINDINGS: The heart size is normal. Mild pulmonary vascular congestion is new. Focal right upper lobe density is noted. This was present in the prior study. Increased interstitial markings are noted, likely related to edema. No other significant consolidation is present. NG tube is in place. Bowel gas pattern is normal. IMPRESSION: 1. Ill-defined right upper lobe density. While this could represent airspace disease, it raises concern for a nodule. Recommend CT of the chest with contrast for further evaluation. 2. New mild pulmonary vascular  congestion and interstitial edema. This may be related to heart disease or reactive to a viral infection. 3. Normal bowel gas pattern. 4. The support apparatus is stable. Electronically Signed   By: Marin Roberts M.D.   On: 07/11/2019 11:25   Dg Abd Portable 1v  Result Date:  07/10/2019 CLINICAL DATA:  NG tube placement. EXAM: PORTABLE ABDOMEN - 1 VIEW COMPARISON:  07/07/2019 FINDINGS: The patient is rotated to the right. An enteric tube has been advanced in the interim and now terminates in the right upper quadrant, likely in the distal stomach. No dilated loops of bowel are seen to suggest obstruction. Scattered high density material is noted in colonic diverticula. The included lung bases are clear. IMPRESSION: Enteric tube likely in the distal stomach. Electronically Signed   By: Sebastian Ache M.D.   On: 07/10/2019 17:46    EKG:   Orders placed or performed during the hospital encounter of 07/01/19  . ED EKG  . ED EKG  . ED EKG  . ED EKG    ASSESSMENT AND PLAN:   Dysphagia- likely due to underlying psychiatric disorder vs tardive dyskinesia secondary to psychiatric medicines. Pulled NG tube out for the 3rd time yesterday. -Recent EGD 06/15/2019 with normal esophagus and some gastritis -Recent barium swallow with normal esophagus and a paraesophageal hernia -Repeat modified barium swallow today -SLP recommending dysphagia I diet- concerns that patient will be unable to maintain his nutritional status -Palliative care consult for goals of care discussion  Catatonic schizophrenia -Underwent electroconvulsive therapy on 10/23 and 10/26 -Psychiatry is planning for third ECT treatment today -Continue Prozac -Continue ativan 1 mg every 6 hours -Zyprexa IM prn agitation  GERD -Continue PPI  Mild malnutrition -Discussing goals of care with family  DVT prophylaxis- Lovenox  Updated father, Siri Cole, over the phone.  CODE STATUS: Full code  TOTAL TIME TAKING CARE OF THIS PATIENT: 35 minutes.   Note: This dictation was prepared with Dragon dictation along with smaller phrase technology. Any transcriptional errors that result from this process are unintentional.   Hilton Sinclair M.D on 07/12/2019 at 1:22 PM  Between 7am to 6pm - Pager -  [phone]  After 6pm go to [email or web address] - password [email or web address]  [employer] Office  [phone]  CC: Primary care physician; Benita Stabile, MD

## 2019-07-12 NOTE — Progress Notes (Signed)
Nutrition Follow-up  DOCUMENTATION CODES:   Not applicable  INTERVENTION:  Provide Hormel Shake (Vital Cuisine) po TID with meals, each supplement provides 520 kcal and 22 grams of protein.  Provide Magic cup TID with meals, each supplement provides 290 kcal and 9 grams of protein.  Will monitor for outcome of discussions regarding goals of care.  NUTRITION DIAGNOSIS:   Inadequate oral intake related to dysphagia as evidenced by other (comment)(per chart review).  Ongoing - diet now advanced so will monitor adequacy.  GOAL:   Patient will meet greater than or equal to 90% of their needs  Progressing.  MONITOR:   PO intake, Supplement acceptance, Labs, Weight trends, Skin, I & O's  REASON FOR ASSESSMENT:   Consult Assessment of nutrition requirement/status, Enteral/tube feeding initiation and management  ASSESSMENT:   48 year old male patient with a known history of bipolar disorder, catatonic schizophrenia, GERD, schizoaffective disorder, tardive dyskinesia presented to the emergency room for difficulty swallowing  Patient pulled out his NGT 3 times so it was decided to leave tube out at this time. Per psychiatry note from yesterday patient had been deteriorating with little improvement from ECT so plan was to stop ECT treatments at this time. Following MBSS today diet was advanced to dysphagia 1 with nectar-thick liquids. There is concern about how much patient can take in. Palliative care was consulted and spoke with patient's father today. Family is discussing goals of care.  Medications reviewed and include: pantoprazole, senna-docusate 2 tablets BID, vitamin B12 1000 micrograms daily, potassium chloride 10 mEQ IV 2 times today.  Labs reviewed: CBG 97-114.  Diet Order:   Diet Order            DIET - DYS 1 Room service appropriate? Yes with Assist; Fluid consistency: Nectar Thick  Diet effective now             EDUCATION NEEDS:   Not appropriate for education  at this time  Skin:  Skin Assessment: Reviewed RN Assessment  Last BM:  07/10/2019 - medium type 4  Height:   Ht Readings from Last 1 Encounters:  07/05/19 5\' 5"  (1.651 m)   Weight:   Wt Readings from Last 1 Encounters:  07/12/19 61.9 kg   Ideal Body Weight:  56.8 kg  BMI:  Body mass index is 22.7 kg/m.  Estimated Nutritional Needs:   Kcal:  1600-1800kcal/day  Protein:  80-90g/day  Fluid:  >1.8L/day  Willey Blade, MS, RD, LDN Office: [phone] Pager: [phone] After Hours/Weekend Pager: [phone]

## 2019-07-12 NOTE — Consult Note (Signed)
Consultation Note Date: 07/12/2019   Patient Name: Troy Fox  DOB: 1970-10-31  MRN: [id number]  Age / Sex: 48 y.o., male  PCP: Celene Squibb, MD Referring Physician: Sela Hua, MD  Reason for Consultation: Establishing goals of care  HPI/Patient Profile: Troy Fox  is a 48 y.o. male with a known history of bipolar disorder, catatonic schizophrenia, GERD, schizoaffective disorder, tardive dyskinesia presented to the emergency room for difficulty swallowing.  Patient unable to swallow food.  He was evaluated by psychiatry but recommended higher level of care.  Clinical Assessment and Goals of Care: Patient is resting in bed sleeping. Spoke with father Troy Fox, who is HPOA via phone. 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 He states his son has always been mentally disabled due to psychiatric illness. 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 In October of 2019, he went into the hospital with ECT. He had a feeding tube for dehydration and malnutrition. He improved and was able to leave the hospital to enter residential treatment for a period. He moved here to [place] with his father. His father became his HPOA. He was "doing fine" for a while, and then began requiring ECT again.    We discussed his diagnoses, prognosis, GOC, EOL wishes disposition and options.  A detailed discussion was had today regarding advanced directives.  Concepts specific to code status, artifical feeding and hydration, IV antibiotics and rehospitalization were discussed.  The difference between an aggressive medical intervention path and a comfort care path was discussed.  Values and goals of care important to patient and family were attempted to be elicited.  Discussed limitations of medical interventions to prolong quality of life in some situations and discussed the concept of  human mortality.  He states GI recommended surgery for his para-esophogeal hernia previously. We discussed his poor PO intake on modified diet, requirement of medications for agitation and sleepiness. We discussed his conversation with psychiatry. He states he will need to talk with the patient's sisters, but he believes the family may want to shift to comfort care as it appears his QOL cannot be improved.     SUMMARY OF RECOMMENDATIONS   Will call father back at 9:30 tomorrow morning. May shift to comfort.   Prognosis:   Poor.   Discharge Planning: To Be Determined      Primary Diagnoses: Present on Admission: **None**   I have reviewed the medical record, interviewed the patient and family, and examined the patient. The following aspects are pertinent.  Past Medical History:  Diagnosis Date  . Bipolar 1 disorder ([place])   . 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[place])   . GERD (gastroesophageal reflux disease)   . Nonverbal   . Schizoaffective disorder ([place])   . Tardive dyskinesia    Social History   Socioeconomic History  . Marital status: Single    Spouse name: Not on file  . Number of children: Not on file  . Years of education: Not on file  .  Highest education level: Not on file  Occupational History  . Not on file  Social Needs  . Financial resource strain: Not on file  . Food insecurity    Worry: Not on file    Inability: Not on file  . Transportation needs    Medical: Not on file    Non-medical: Not on file  Tobacco Use  . Smoking status: Never Smoker  . Smokeless tobacco: Never Used  Substance and Sexual Activity  . Alcohol use: Never    Frequency: Never  . Drug use: Never  . Sexual activity: Not Currently  Lifestyle  . Physical activity    Days per week: Not on file    Minutes per session: Not on file  . Stress: Not on file  Relationships  . Social Musicianconnections    Talks on phone: Not on file    Gets together: Not on file    Attends religious service: Not on  file    Active member of club or organization: Not on file    Attends meetings of clubs or organizations: Not on file    Relationship status: Not on file  Other Topics Concern  . Not on file  Social History Narrative  . Not on file   Family History  Adopted: Yes  Family history unknown: Yes   Scheduled Meds: . enoxaparin (LOVENOX) injection  40 mg Subcutaneous Q24H  . FLUoxetine  40 mg Oral Daily  . mouth rinse  15 mL Mouth Rinse BID  . OLANZapine  10 mg Oral QHS  . pantoprazole  40 mg Oral Daily  . senna-docusate  2 tablet Oral BID  . sodium chloride flush  3 mL Intravenous Q12H  . traZODone  100 mg Oral QHS  . Valbenazine Tosylate  80 mg Oral QHS  . cyanocobalamin  1,000 mcg Oral Daily   Continuous Infusions: . sodium chloride Stopped (07/09/19 1146)   PRN Meds:.sodium chloride, acetaminophen **OR** acetaminophen, acetaminophen, alum & mag hydroxide-simeth, dicyclomine, diphenhydrAMINE, metoprolol tartrate, OLANZapine, ondansetron **OR** ondansetron (ZOFRAN) IV, polyethylene glycol, sodium chloride flush Medications Prior to Admission:  Prior to Admission medications   Medication Sig Start Date End Date Taking? Authorizing Provider  acetaminophen (TYLENOL) 500 MG tablet Take 1,000 mg by mouth every 8 (eight) hours as needed for moderate pain.   Yes [provider]  alum & mag hydroxide-simeth (MYLANTA) 200-200-20 MG/5ML suspension Take 30 mLs by mouth every 6 (six) hours as needed for indigestion or heartburn.   Yes [provider]  cyanocobalamin 1000 MCG tablet Take 1 tablet (1,000 mcg total) by mouth daily. 03/05/19  Yes Clapacs, Jackquline DenmarkJohn T, MD  dicyclomine (BENTYL) 20 MG tablet Take 1 tablet (20 mg total) by mouth 2 (two) times daily as needed for spasms. 05/15/19  Yes Arnaldo NatalKennedy-Smith, Colleen M, NP  FLUoxetine (PROZAC) 40 MG capsule TAKE 1 CAPSULE BY MOUTH DAILY. Patient taking differently: Take 40 mg by mouth daily.  04/09/19  Yes Clapacs, Jackquline DenmarkJohn T, MD  OLANZapine  (ZYPREXA) 10 MG tablet Take 10 mg by mouth at bedtime.  05/07/19  Yes [provider]  omeprazole (PRILOSEC) 40 MG capsule Take 40 mg by mouth daily.   Yes [provider]  traZODone (DESYREL) 100 MG tablet Take 1 tablet (100 mg total) by mouth at bedtime. 03/05/19  Yes Clapacs, Jackquline DenmarkJohn T, MD  Valbenazine Tosylate [facility](INGREZZA) 80 MG CAPS Take 30 mg by mouth daily. Patient taking differently: Take 80 mg by mouth daily.  03/26/19  Yes Pucilowski, Olgierd  A, MD   Allergies  Allergen Reactions  . Aripiprazole Other (See Comments)    Causes gambling addiction  *abilify    Review of Systems  Unable to perform ROS   Physical Exam Constitutional:      Comments: Resting with eyes closed.      Vital Signs: BP 91/69 (BP Location: Right Arm)   Pulse 87   Temp 98.2 F (36.8 C) (Axillary)   Resp 17   Ht 5\' 5"  (1.651 m)   Wt 61.9 kg   SpO2 100%   BMI 22.70 kg/m  Pain Scale: 0-10   Pain Score: Asleep   SpO2: SpO2: 100 % O2 Device:SpO2: 100 % O2 Flow Rate: .O2 Flow Rate (L/min): 2 L/min  IO: Intake/output summary:   Intake/Output Summary (Last 24 hours) at 07/12/2019 1009 Last data filed at 07/11/2019 2208 Gross per 24 hour  Intake 200 ml  Output 1450 ml  Net -1250 ml    LBM: Last BM Date: 07/10/19 Baseline Weight: Weight: 65.8 kg Most recent weight: Weight: 61.9 kg     Palliative Assessment/Data: 30%     Time In: 9:10 Time Out: 10:00 Time Total: 50 min Greater than 50%  of this time was spent counseling and coordinating care related to the above assessment and plan.  Signed by: 07/12/19, NP   Please contact Palliative Medicine Team phone at [phone] for questions and concerns.  For individual provider: See [phone]

## 2019-07-13 ENCOUNTER — Inpatient Hospital Stay: Payer: Medicare Other

## 2019-07-13 DIAGNOSIS — Z7189 Other specified counseling: Secondary | ICD-10-CM

## 2019-07-13 DIAGNOSIS — Z515 Encounter for palliative care: Secondary | ICD-10-CM

## 2019-07-13 LAB — GLUCOSE, CAPILLARY: Glucose-Capillary: 88 mg/dL (ref 70–99)

## 2019-07-13 MED ORDER — SUCCINYLCHOLINE CHLORIDE 20 MG/ML IJ SOLN
INTRAMUSCULAR | Status: AC
  Filled 2019-07-13: qty 1

## 2019-07-13 MED ORDER — MIDAZOLAM HCL 2 MG/2ML IJ SOLN
INTRAMUSCULAR | Status: AC
  Filled 2019-07-13: qty 2

## 2019-07-13 MED ORDER — NALOXONE HCL 0.4 MG/ML IJ SOLN
INTRAMUSCULAR | Status: AC
  Filled 2019-07-13: qty 1

## 2019-07-13 MED ORDER — REMIFENTANIL HCL 1 MG IV SOLR
INTRAVENOUS | Status: AC
  Filled 2019-07-13: qty 1000

## 2019-07-13 NOTE — Progress Notes (Signed)
Daily Progress Note   Patient Name: Troy Fox       Date: 07/13/2019 DOB: December 05, 1970  Age: 48 y.o. MRN#: [id number] Attending Physician: Sela Hua, MD Primary Care Physician: Celene Squibb, MD Admit Date: 07/01/2019  Reason for Consultation/Follow-up: Establishing goals of care  Subjective: Patient is resting in bed with sitter at bedside. He only answers my questions that are asked, and in single word responses. Sitter states he has been talking about wanting to not have people tell him what to do, that he wants to do what he wants when he wants. He says "yes" when she said that. He has only eaten a few bites of his breakfast and some juice. He does not want anything more to eat.   Spoke with his father who is HPOA.  We discussed his diagnosis, prognosis, GOC, EOL wishes disposition and options.  A detailed discussion was had today regarding advanced directives.  Concepts specific to code status, artifical feeding and hydration, IV antibiotics and rehospitalization were discussed.  The difference between an aggressive medical intervention path and a comfort care path was discussed.  Values and goals of care important to patient and family were attempted to be elicited.  Discussed limitations of medical interventions to prolong quality of life in some situations and discussed the concept of human mortality.  Mr. Amedeo Plenty has spoken with the medical team and has a good grasp on Balthazar's medical situation. We discussed his risk of aspiration, poor PO intake, his TD symptoms, and his overall decline even with psychiatric care including ECT. He states he was able to speak with Adem's sisters, and the family does not want to prolong suffering for him. Thurston has told the family previously  that he cannot do this anymore.They would like to focus on comfort.   I completed a MOST form today through [facility], and the signed original was placed in the chart. A photocopy was placed in the chart to be scanned into EMR. The patient's father/HPOA outlined their wishes for the following treatment decisions:  Cardiopulmonary Resuscitation: Do Not Attempt Resuscitation (DNR/No CPR)  Medical Interventions: Comfort Measures: Keep clean, warm, and dry. Use medication by any route, positioning, wound care, and other measures to relieve pain and suffering. Use oxygen, suction and manual treatment of airway obstruction as needed for comfort.  Do not transfer to the hospital unless comfort needs cannot be met in current location.  Antibiotics: No antibiotics (use other measures to relieve symptoms)  IV Fluids: No IV fluids (provide other measures to ensure comfort)  Feeding Tube: No feeding tube     Length of Stay: 9  Current Medications: Scheduled Meds:  . enoxaparin (LOVENOX) injection  40 mg Subcutaneous Q24H  . FLUoxetine  40 mg Oral Daily  . mouth rinse  15 mL Mouth Rinse BID  . OLANZapine  10 mg Oral QHS  . pantoprazole  40 mg Oral Daily  . senna-docusate  2 tablet Oral BID  . sodium chloride flush  3 mL Intravenous Q12H  . traZODone  100 mg Oral QHS  . Valbenazine Tosylate  80 mg Oral QHS  . cyanocobalamin  1,000 mcg Oral Daily    Continuous Infusions: . sodium chloride Stopped (07/12/19 1547)    PRN Meds: sodium chloride, acetaminophen **OR** acetaminophen, acetaminophen, alum & mag hydroxide-simeth, dicyclomine, diphenhydrAMINE, metoprolol tartrate, OLANZapine, ondansetron **OR** ondansetron (ZOFRAN) IV, polyethylene glycol, sodium chloride flush  Physical Exam Pulmonary:     Effort: Pulmonary effort is normal.  Neurological:     Mental Status: He is alert.             Vital Signs: BP 118/77   Pulse 70   Temp (!) 97.4 F (36.3 C) (Oral)   Resp 17   Ht _0  (1.651 m)    Wt 61.1 kg   SpO2 100%   BMI 22.40 kg/m  SpO2: SpO2: 100 % O2 Device: O2 Device: Nasal Cannula O2 Flow Rate: O2 Flow Rate (L/min): 2 L/min  Intake/output summary:   Intake/Output Summary (Last 24 hours) at 07/13/2019 0956 Last data filed at 07/13/2019 0410 Gross per 24 hour  Intake 457.04 ml  Output 750 ml  Net -292.96 ml   LBM: Last BM Date: 07/12/19 Baseline Weight: Weight: 65.8 kg Most recent weight: Weight: 61.1 kg       Palliative Assessment/Data: 30% at best      Patient Active Problem List   Diagnosis Date Noted  . Dysphagia 07/03/2019  . Abdominal pain, epigastric 05/15/2019  . Diarrhea 05/15/2019  . Hiatal hernia 05/15/2019  . Schizoaffective disorder, depressive type ([place]) 01/29/2019  . Catatonia 01/29/2019  . Mild malnutrition ([place]) 01/29/2019  . Schizoaffective disorder, bipolar type ([place]) 01/03/2019  . Acute neuroleptic-induced akathisia 01/03/2019    Palliative Care Assessment & Plan   Recommendations/Plan:  Recommend hospice facility placement, father is amenable.   Code Status:    Code Status Orders  (From admission, onward)         Start     Ordered   07/03/19 1506  Full code  Continuous     07/03/19 1505        Code Status History    Date Active Date Inactive Code Status Order ID Comments User Context   01/29/2019 1504 03/05/2019 1641 Full Code [id number]  Clapacs, Madie [place], MD Inpatient   Advance Care Planning Activity    Advance Directive Documentation     Most Recent Value  Type of Advance Directive  Healthcare Power of Attorney  Pre-existing out of facility DNR order (yellow form or pink MOST form)  -  "MOST" Form in Place?  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 Very poor PO intake. Tardive Dyskinesia. ECT with poor response.    Care plan was discussed with  CM  Thank you for allowing the Palliative Medicine Team to assist in the care of this patient.   Time In: 9:20 Time  Out: 10:10 Total Time 50 min Prolonged Time Billed no      Greater than 50%  of this time was spent counseling and coordinating care related to the above assessment and plan.  Asencion Gowda, NP  Please contact Palliative Medicine Team phone at [phone] for questions and concerns.

## 2019-07-13 NOTE — Plan of Care (Signed)
  Problem: Education: Goal: Knowledge of General Education information will improve Description: Including pain rating scale, medication(s)/side effects and non-pharmacologic comfort measures Outcome: Not Progressing Pt has AMS and is not able to participate in his POC.  His father is updated daily per MD.    Problem: Clinical Measurements: Goal: Ability to maintain clinical measurements within normal limits will improve Outcome: Not Progressing VS are within parameters.  Problem: Clinical Measurements: Goal: Will remain free from infection Outcome: Progressing S/Sx of infection monitored and assessed q-shift/ PRN, along with VS.  Pt has been afebrile thus.  Will continue to monitor and assess.  Problem: Clinical Measurements: Goal: Respiratory complications will improve Outcome: Progressing  Respiratory status monitored and assessed q-shift/ PRN, along with VS.  Pt is on room air with O2 saturations at 100% and respiration rate of 20 breaths per minute.  Pt has not endorsed SOB or DOE.  Will continue to monitor and assess.   Problem: Clinical Measurements: Goal: Cardiovascular complication will be avoided Outcome: Progressing BP and HR WNL.  Will continue to monitor and assess.   Problem: Activity: Goal: Risk for activity intolerance will decrease Outcome: Progressing Pt is able to reposition himself in the bed without the assistance of RN staff.  Will continue to monitor and assess.    Problem: Nutrition: Goal: Adequate nutrition will be maintained Outcome: Not Progressing Pt has decreased PO intake.  He needs RN staff to feed him and prompt him to swallow.  He displays no interest in eating.  Will continue to encourage.   Problem: Coping: Goal: Level of anxiety will decrease Outcome: Not Progressing Pt has expressed anxiety r/t death.  Tried to reassure pt and he would be ok.  Will continue to offer support PRN.   Problem: Elimination: Goal: Will not experience  complications related to bowel motility Outcome: Progressing Pt had a BM yesterday per NA.   Problem: Pain Managment: Goal: General experience of comfort will improve Outcome: Progressing Pt has denied pain thus far.  Will continue to monitor and assess.   Problem: Safety: Goal: Ability to remain free from injury will improve Outcome: Progressing Instructed pt to utilize RN call light for assistance.  Hourly rounds performed.  Bed alarm implemented to keep pt safe from falls.  Settings set to third most sensitive settings.  Pt is a very high falls risk.  D/T his potential of falling he has a 1:1 sitter at all times.  Bed in lowest position, locked with two upper/ one lower side rails engaged.  Belongings and call light within reach.   Problem: Skin Integrity: Goal: Risk for impaired skin integrity will decrease Outcome: Progressing Skin integrity monitored and assessed q-shift/ PRN.  Instructed pt to turn q2 hours to prevent further skin impairment.  Tubes and drains assessed for device related pressure sores.  Pt is incontinent of his bowel and bladder.  He has a condom catheter to contain his urine.  Perineal care is given promptly after each episode.  Dressing changes performed per MD's orders.  Will continue to monitor and assess.

## 2019-07-13 NOTE — Progress Notes (Signed)
[facility] - [employer] at [facility]   PATIENT NAME: Troy Fox    MR#:  [id number]  DATE OF BIRTH:  [birth year]  SUBJECTIVE:   Patient is awake this morning. He states he doesn't feel well. He cannot elaborate further.  He denies any chest pain or shortness of breath.  He endorses some mild abdominal pain.  He states he does not want to eat.  REVIEW OF SYSTEMS:  limited due to psychiatric illness  DRUG ALLERGIES:   Allergies  Allergen Reactions  . Aripiprazole Other (See Comments)    Causes gambling addiction  *abilify     VITALS:  Blood pressure 118/77, pulse 70, temperature (!) 97.4 F (36.3 C), temperature source Oral, resp. rate 17, height 5\' 5"  (1.651 m), weight 61.1 kg, SpO2 100 %.  PHYSICAL EXAMINATION:  GENERAL:  48 y.o.-year-old patient lying in the bed with no acute distress.  EYES: Pupils equal, round, reactive to light and accommodation. No scleral icterus. Extraocular muscles intact.  HEENT: Head atraumatic, normocephalic. Oropharynx and nasopharynx clear. NG tube in place. NECK:  Supple, no jugular venous distention. No thyroid enlargement, no tenderness.  LUNGS: Normal breath sounds bilaterally, no wheezing, rales,rhonchi or crepitation. No use of accessory muscles of respiration.  CARDIOVASCULAR: RRR, S1, S2 normal. No murmurs, rubs, or gallops.  ABDOMEN: Soft, nontender, nondistended. Bowel sounds present. No organomegaly or mass.  EXTREMITIES: No pedal edema, cyanosis, or clubbing.  NEUROLOGIC: Opens eyes to voice, but does not follow commands or speak. PSYCHIATRIC: Sleepy.  Does not answer questions of orientation. SKIN: No obvious rash, lesion, or ulcer.   LABORATORY PANEL:   CBC Recent Labs  Lab 07/11/19 0535  WBC 4.7  HGB 13.6  HCT 39.7  PLT 158   ------------------------------------------------------------------------------------------------------------------  Chemistries  Recent Labs  Lab 07/10/19 0553   07/12/19 0333  NA 142   < > 141  K 3.3*   < > 3.5  CL 106   < > 105  CO2 30   < > 28  GLUCOSE 121*   < > 89  BUN 10   < > 7  CREATININE 0.57*   < > 0.49*  CALCIUM 9.2   < > 9.5  MG 2.1  --   --    < > = values in this interval not displayed.   ------------------------------------------------------------------------------------------------------------------  Cardiac Enzymes No results for input(s): TROPONINI in the last 168 hours. ------------------------------------------------------------------------------------------------------------------  RADIOLOGY:  Dg Abd 1 View  Result Date: 07/13/2019 CLINICAL DATA:  Abdominal pain. EXAM: ABDOMEN - 1 VIEW COMPARISON:  07/11/2019 FINDINGS: Bowel gas pattern is nonobstructive. Mild amount of contrast is seen throughout the colon along with air and stool. No dilated small bowel loops. No air-fluid levels. No free peritoneal air. Mild degenerate change of the spine. IMPRESSION: Nonobstructive bowel gas pattern. Electronically Signed   By: 07/13/2019 M.D.   On: 07/13/2019 08:43   Dg Abd 1 View  Result Date: 07/11/2019 CLINICAL DATA:  NG tube placement. EXAM: ABDOMEN - 1 VIEW COMPARISON:  07/10/2019 FINDINGS: NG tube tip now appears to be in the third portion of the duodenum. Bowel gas pattern is normal. No acute bone abnormality. Accentuated lung markings, stable. IMPRESSION: NG tube in the region of the third portion of the duodenum. Electronically Signed   By: 07/12/2019 M.D.   On: 07/11/2019 13:52   Dg Chest Port 1 View  Result Date: 07/11/2019 CLINICAL DATA:  Cough. EXAM: PORTABLE CHEST 1 VIEW COMPARISON:  Two-view  chest x-ray 07/01/2019 FINDINGS: The heart size is normal. Mild pulmonary vascular congestion is new. Focal right upper lobe density is noted. This was present in the prior study. Increased interstitial markings are noted, likely related to edema. No other significant consolidation is present. NG tube is in place. Bowel gas  pattern is normal. IMPRESSION: 1. Ill-defined right upper lobe density. While this could represent airspace disease, it raises concern for a nodule. Recommend CT of the chest with contrast for further evaluation. 2. New mild pulmonary vascular congestion and interstitial edema. This may be related to heart disease or reactive to a viral infection. 3. Normal bowel gas pattern. 4. The support apparatus is stable. Electronically Signed   By: San Morelle M.D.   On: 07/11/2019 11:25    EKG:   Orders placed or performed during the hospital encounter of 07/01/19  . ED EKG  . ED EKG  . ED EKG  . ED EKG    ASSESSMENT AND PLAN:   Dysphagia- likely due to underlying psychiatric disorder vs tardive dyskinesia secondary to psychiatric medicines. Pulled NG tube out for the 3rd time yesterday. -Recent EGD 06/15/2019 with normal esophagus and some gastritis -Recent barium swallow with normal esophagus and a paraesophageal hernia -SLP with concerns that patient will be unable to maintain his nutritional/hydration status -Palliative care following- patient has been transitioned to full comfort care  Catatonic schizophrenia -Underwent electroconvulsive therapy x 3 without much improvement -Now full comfort care  GERD -Continue PPI  Mild malnutrition -Can eat for comfort  DVT prophylaxis- Lovenox  Plan is for discharge to hospice facility when a bed is available. Updated father, [name], over the phone.  CODE STATUS: Full code  TOTAL TIME TAKING CARE OF THIS PATIENT: 35 minutes.   Note: This dictation was prepared with Dragon dictation along with smaller phrase technology. Any transcriptional errors that result from this process are unintentional.   Evette Doffing M.D on 07/13/2019 at 10:41 AM  Between 7am to 6pm - Pager - [phone]  After 6pm go to [email or web address] - password [email or web address]  [facility] Office  [phone]  CC: Primary care physician; Celene Squibb, MD

## 2019-07-13 NOTE — Progress Notes (Signed)
PT Cancellation Note  Patient Details Name: Troy Fox MRN: [id number] DOB: 1971-02-01   Cancelled Treatment:    Reason Eval/Treat Not Completed: Medical issues which prohibited therapy(Per chart review, patient now transitioned to comfort care.  PT order will be completed.  Please re-consult should goals of care change.)   Jacey Eckerson H. Owens Shark, PT, DPT, NCS 07/13/19, 11:59 AM [phone]

## 2019-07-13 NOTE — Care Management Important Message (Signed)
Important Message  Patient Details  Name: Troy Fox MRN: [id number] Date of Birth: 1971/06/10   Medicare Important Message Given:  Yes     Juliann Pulse A Myrian Botello 07/13/2019, 11:48 AM

## 2019-07-13 NOTE — Progress Notes (Signed)
SLP Cancellation Note  Patient Details Name: Troy Fox MRN: [id number] DOB: 02/09/1971   Cancelled treatment:       Reason Eval/Treat Not Completed: (chart reviewed; consulted NSG then MD) Per Palliative Care and MD, pt is not eating but bites/sips intermittently -- no overt coughing w/ the modified diet has been noted by [facility] staff during intake. Per MD and Palliative Care, patient has been transitioned to full comfort care now. ST services can be available for further questions or needs w/ MD to reconsult if needed. MD agreed.     Orinda Kenner, [place], CCC-SLP Nima Bamburg 07/13/2019, 11:08 AM

## 2019-07-13 NOTE — TOC Progression Note (Addendum)
Transition of Care [facility]) - Progression Note    Patient Details  Name: Troy Fox MRN: [id number] Date of Birth: 29-Nov-1970  Transition of Care [facility]) CM/SW [place], LCSW Phone Number: 07/13/2019, 10:35 AM  Clinical Narrative: Left message for admissions coordinator at the [facility] to call back.     2:36 pm: Referral for hospice house has been made to [place] at [facility].  Expected Discharge Plan: [place]    Expected Discharge Plan and Services Expected Discharge Plan: [place] arrangements for the past 2 months: Single Family Home                                       Social Determinants of Health (SDOH) Interventions    Readmission Risk Interventions No flowsheet data found.

## 2019-07-14 MED ORDER — LORAZEPAM 2 MG/ML IJ SOLN
1.0000 mg | INTRAMUSCULAR | Status: DC | PRN
  Administered 2019-07-14 – 2019-07-17 (×5): 1 mg via INTRAVENOUS
  Filled 2019-07-14 (×5): qty 1

## 2019-07-14 MED ORDER — MORPHINE SULFATE (CONCENTRATE) 10 MG/0.5ML PO SOLN
5.0000 mg | ORAL | Status: DC | PRN
  Administered 2019-07-17: 5 mg via ORAL
  Filled 2019-07-14: qty 0.5

## 2019-07-14 NOTE — Progress Notes (Signed)
[facility] - [place] at [facility]   PATIENT NAME: Troy Fox    MR#:  [id number]  DATE OF BIRTH:  Jan 16, 1971  SUBJECTIVE:   Patient is alert this morning, but continues to be minimally responsive. He denies any concerns today.  REVIEW OF SYSTEMS:  limited due to psychiatric illness  DRUG ALLERGIES:   Allergies  Allergen Reactions  . Aripiprazole Other (See Comments)    Causes gambling addiction  *abilify     VITALS:  Blood pressure 138/84, pulse (!) 110, temperature 98.2 F (36.8 C), temperature source Oral, resp. rate 17, height 5\' 5"  (1.651 m), weight 61.1 kg, SpO2 99 %.  PHYSICAL EXAMINATION:  GENERAL:  48 y.o.-year-old patient lying in the bed with no acute distress.  EYES: Pupils equal, round, reactive to light and accommodation. No scleral icterus. Extraocular muscles intact.  HEENT: Head atraumatic, normocephalic. Oropharynx and nasopharynx clear. NG tube in place. NECK:  Supple, no jugular venous distention. No thyroid enlargement, no tenderness.  LUNGS: Normal breath sounds bilaterally, no wheezing, rales,rhonchi or crepitation. No use of accessory muscles of respiration.  CARDIOVASCULAR: RRR, S1, S2 normal. No murmurs, rubs, or gallops.  ABDOMEN: Soft, nontender, nondistended. Bowel sounds present. No organomegaly or mass.  EXTREMITIES: No pedal edema, cyanosis, or clubbing.  NEUROLOGIC: Opens eyes to voice, but does not follow commands or speak. PSYCHIATRIC: Sleepy.  Does not answer questions of orientation. SKIN: No obvious rash, lesion, or ulcer.   LABORATORY PANEL:   CBC Recent Labs  Lab 07/11/19 0535  WBC 4.7  HGB 13.6  HCT 39.7  PLT 158   ------------------------------------------------------------------------------------------------------------------  Chemistries  Recent Labs  Lab 07/10/19 0553  07/12/19 0333  NA 142   < > 141  K 3.3*   < > 3.5  CL 106   < > 105  CO2 30   < > 28  GLUCOSE 121*   < > 89  BUN 10    < > 7  CREATININE 0.57*   < > 0.49*  CALCIUM 9.2   < > 9.5  MG 2.1  --   --    < > = values in this interval not displayed.   ------------------------------------------------------------------------------------------------------------------  Cardiac Enzymes No results for input(s): TROPONINI in the last 168 hours. ------------------------------------------------------------------------------------------------------------------  RADIOLOGY:  Dg Abd 1 View  Result Date: 07/13/2019 CLINICAL DATA:  Abdominal pain. EXAM: ABDOMEN - 1 VIEW COMPARISON:  07/11/2019 FINDINGS: Bowel gas pattern is nonobstructive. Mild amount of contrast is seen throughout the colon along with air and stool. No dilated small bowel loops. No air-fluid levels. No free peritoneal air. Mild degenerate change of the spine. IMPRESSION: Nonobstructive bowel gas pattern. Electronically Signed   By: 07/13/2019 M.D.   On: 07/13/2019 08:43    EKG:   Orders placed or performed during the hospital encounter of 07/01/19  . ED EKG  . ED EKG  . ED EKG  . ED EKG    ASSESSMENT AND PLAN:   Dysphagia- likely due to underlying psychiatric disorder. -Recent EGD 06/15/2019 with normal esophagus and some gastritis -Recent barium swallow with normal esophagus and a paraesophageal hernia -SLP with concerns that patient will be unable to maintain his nutritional/hydration status -Palliative care following- patient has been transitioned to full comfort care  Catatonic schizophrenia -Underwent electroconvulsive therapy x 3 without much improvement -Psychiatry has signed off -Now full comfort care  GERD -Continue PPI  Mild malnutrition -Can eat for comfort  DVT prophylaxis- stop lovenox due to  comfort care status  Plan is for discharge to hospice facility when a bed is available. Updated father, Herb, over the phone.  CODE STATUS: Full code  TOTAL TIME TAKING CARE OF THIS PATIENT: 32 minutes.   Note: This dictation  was prepared with Dragon dictation along with smaller phrase technology. Any transcriptional errors that result from this process are unintentional.   Evette Doffing M.D on 07/14/2019 at 11:19 AM  Between 7am to 6pm - Pager - [phone]  After 6pm go to [email or web address] - password [employer]  [facility] Office  [phone]  CC: Primary care physician; Celene Squibb, MD

## 2019-07-15 DIAGNOSIS — F250 Schizoaffective disorder, bipolar type: Secondary | ICD-10-CM

## 2019-07-15 DIAGNOSIS — F202 Catatonic schizophrenia: Secondary | ICD-10-CM

## 2019-07-15 NOTE — Progress Notes (Addendum)
PROGRESS NOTE  Troy Fox HWE:[id number] DOB: 1971/03/21 DOA: 07/01/2019 PCP: Celene Squibb, MD  Brief History   48 year old man PMH bipolar disorder, catatonic schizophrenia, schizoaffective disorder, tardive dyskinesia, GERD, presented with difficulty swallowing, failure to thrive, tremors, troponin.  He was evaluated by psychiatry but felt to need a higher level of care.  He was seen by gastroenterology and found to have no structural lesion to explain his dysphagia.  Psychiatric care recommended.  He was also seen by ENT and no structural lesion was identified.  Psychiatric care recommended.  He was seen by psychiatry and underwent ECT but did not respond so it was discontinued.  He was seen by speech therapy which recommended dysphagia 1 diet but there was concern the patient will be unable to maintain his nutritional status.  He was seen by palliative medicine and in discussion with the patient's father, given the patient's poor response to treatment, he was made comfort care and plans were made for transition to residential hospice.  A & P  Dysphagia thought secondary to psychiatric disorder.  EGD October 2 showed some gastritis but normal esophagus.  Previous barium swallow showed normal esophagus and paraesophageal hernia.  No sign of obstructive lesion amenable to endoscopic intervention.  Seen by ENT and underwent diagnostic fiberoptic nasolaryngoscopy.  No lesions noted.  No gross infection noted.  ENT recommended attention to psychiatric care and ongoing speech therapy.  SLP with concern for maintaining nutritional and hydration status.  Palliative care following, patient transitioned to full comfort care --Continue comfort care. --SLP recommended dysphagia level 1 with thin liquids.  Strict reflux precautions.  General aspiration precautions medications crushed in pure.  Bipolar 1 disorder, catatonic schizophrenia, schizoaffective disorder, tardive dyskinesia --Underwent  electroconvulsive therapy x3 without much improvement.  Psychiatry has signed off.  Full comfort care.  GERD --PPI  DVT prophylaxis: not indicated based on GOC Code Status: DNR Family Communication: none Disposition Plan: Hospice facility when bed available.   [name], MD  [facility] Direct contact: see [email or web address] (further directions at bottom of note if needed) 7PM-7AM contact night coverage as at bottom of note 07/15/2019, 2:52 PM  LOS: 11 days   Significant Hospital Events   . 10/18 admitted for failure to thrive, dysphagia   Consults:  . GI . ENT . Psychiatry 10/23   Procedures:  . 10/21 diagnostic fiberoptic nasolaryngoscopy. Findings:Nasal cavity and nasopharynx clear. TVC are clear and mobile. No lesions of the larynx or hypopharynx. Moderate lingual tonsillar hypertrophy, but no gross infection and no masses. Dry mucous membranes . 10/23 ECT . 10/26 ECT . 10/28 ECT  Significant Diagnostic Tests:  Marland Kitchen    Micro Data:  .    Antimicrobials:  .   Interval History/Subjective  Awake but does not clearly respond to questions.  Objective   Vitals:  Vitals:   07/15/19 0531 07/15/19 0934  BP: 122/82 119/76  Pulse: 87 (!) 125  Resp: 18 18  Temp: 98.7 F (37.1 C) 98.4 F (36.9 C)  SpO2: 99% 96%    Exam:  Constitutional: Appears calm.  Lying in bed with feet pressing against footboard. Cardiovascular: Regular rate and rhythm.  No murmur, rub or gallop.  No lower extremity edema Respiratory: Clear to auscultation bilaterally.  No wheezes, rales or rhonchi.  Normal respiratory effort. Abdomen: Soft Psychiatric: Difficult to assess.  Odd affect.  Mood not assessable.  I have personally reviewed the following:   Today's Data  . No labs today  Scheduled Meds: .  mouth rinse  15 mL Mouth Rinse BID  . OLANZapine  10 mg Oral QHS  . pantoprazole  40 mg Oral Daily  . senna-docusate  2 tablet Oral BID  . sodium chloride flush  3 mL Intravenous Q12H   . traZODone  100 mg Oral QHS  . cyanocobalamin  1,000 mcg Oral Daily   Continuous Infusions: . sodium chloride Stopped (07/12/19 1547)    Principal Problem:   Dysphagia Active Problems:   Schizoaffective disorder, bipolar type (HCC)   Schizoaffective disorder, depressive type (HCC)   LOS: 11 days   How to contact the [facility] Attending or Consulting provider 7A - 7P or covering provider during after hours 7P -7A, for this patient?  1. Check the care team in [facility] and look for a) attending/consulting TRH provider listed and b) the [facility] team listed 2. Log into [email or web address] and use [place] universal password to access. If you do not have the password, please contact the hospital operator. 3. Locate the [facility] provider you are looking for under [facility] and page to a number that you can be directly reached. 4. If you still have difficulty reaching the provider, please page the [facility] (Director on Call) for the Hospitalists listed on amion for assistance.

## 2019-07-16 NOTE — Care Management Important Message (Signed)
Important Message  Patient Details  Name: Troy Fox MRN: [id number] Date of Birth: May 28, 1971   Medicare Important Message Given:  Other (see comment)  Patient now comfort care and awaiting hospice.  Out of respect to pt. / family IM not given.  Juliann Pulse [name] 07/16/2019, 12:47 PM

## 2019-07-16 NOTE — Progress Notes (Addendum)
PROGRESS NOTE  Troy Fox EXB:[id number] DOB: [birth year] DOA: 07/01/2019 PCP: Benita Stabile, MD  Brief History   48 year old man PMH bipolar disorder, catatonic schizophrenia, schizoaffective disorder, tardive dyskinesia, GERD, presented with difficulty swallowing, failure to thrive, tremors, troponin.  He was evaluated by psychiatry but felt to need a higher level of care.  He was seen by gastroenterology and found to have no structural lesion to explain his dysphagia.  Psychiatric care recommended.  He was also seen by ENT and no structural lesion was identified.  Psychiatric care recommended.  He was seen by psychiatry and underwent ECT but did not respond so it was discontinued.  He was seen by speech therapy which recommended dysphagia 1 diet but there was concern the patient will be unable to maintain his nutritional status.  He was seen by palliative medicine and in discussion with the patient's father, given the patient's poor response to treatment, he was made comfort care and plans were made for transition to residential hospice.  A & P  Dysphagia thought secondary to psychiatric disorder.  EGD October 2 showed some gastritis but normal esophagus.  Previous barium swallow showed normal esophagus and paraesophageal hernia.  No sign of obstructive lesion amenable to endoscopic intervention.  Seen by ENT and underwent diagnostic fiberoptic nasolaryngoscopy.  No lesions noted.  No gross infection noted.  ENT recommended attention to psychiatric care and ongoing speech therapy.  SLP with concern for maintaining nutritional and hydration status.  Palliative care following, patient transitioned to full comfort care --Continue comfort care.  Plan for residential hospice. --SLP recommended dysphagia level 1 with thin liquids.  Strict reflux precautions.  General aspiration precautions medications crushed in pure.  Bipolar 1 disorder, catatonic schizophrenia, schizoaffective disorder, tardive  dyskinesia --Underwent electroconvulsive therapy x3 without much improvement.  Psychiatry has signed off.  Continue full comfort care.  Continue Zyprexa.  GERD --PPI  DVT prophylaxis: not indicated based on GOC Code Status: DNR Family Communication: none Disposition Plan: Hospice facility when bed available.   Brendia Sacks, MD  [facility] Direct contact: see [email or web address] (further directions at bottom of note if needed) 7PM-7AM contact night coverage as at bottom of note 07/16/2019, 5:27 PM  LOS: 12 days   Significant Hospital Events   . 10/18 admitted for failure to thrive, dysphagia   Consults:  . GI . ENT . Psychiatry 10/23   Procedures:  . 10/21 diagnostic fiberoptic nasolaryngoscopy. Findings:Nasal cavity and nasopharynx clear. TVC are clear and mobile. No lesions of the larynx or hypopharynx. Moderate lingual tonsillar hypertrophy, but no gross infection and no masses. Dry mucous membranes . 10/23 ECT . 10/26 ECT . 10/28 ECT  Significant Diagnostic Tests:  Marland Kitchen    Micro Data:  .    Antimicrobials:  .   Interval History/Subjective  Speech difficult to understand.  Has been trying to get up per RN at bedside.  Objective   Vitals:  Vitals:   07/15/19 1600 07/16/19 0544  BP: 125/85 115/87  Pulse: (!) 112 97  Resp: 18 16  Temp: 98.3 F (36.8 C) 98.3 F (36.8 C)  SpO2: 99% 99%    Exam:  Constitutional: Appears calm, perhaps mildly uncomfortable Respiratory: Clear to auscultation bilaterally.  No wheezes, rales or rhonchi.  Normal respiratory effort. Cardiovascular: Regular rate and rhythm.  No murmur, rub or gallop.  No lower extremity edema. Psychiatric: Flat affect.  Mood difficult to assess.  Does not engage in conversation.  I have personally reviewed the following:  Today's Data  . No labs today  Scheduled Meds: . mouth rinse  15 mL Mouth Rinse BID  . OLANZapine  10 mg Oral QHS  . pantoprazole  40 mg Oral Daily  . senna-docusate  2  tablet Oral BID  . sodium chloride flush  3 mL Intravenous Q12H  . traZODone  100 mg Oral QHS  . cyanocobalamin  1,000 mcg Oral Daily   Continuous Infusions: . sodium chloride Stopped (07/12/19 1547)    Principal Problem:   Dysphagia Active Problems:   Schizoaffective disorder, bipolar type (HCC)   Schizoaffective disorder, depressive type ([place])   Catatonic schizophrenia ([place])   LOS: 12 days   How to contact the [facility] Attending or Consulting provider 7A - 7P or covering provider during after hours [place], for this patient?  1. Check the care team in [facility] and look for a) attending/consulting TRH provider listed and b) the [facility] team listed 2. Log into [email or web address] and use [place]'s universal password to access. If you do not have the password, please contact the hospital operator. 3. Locate the [facility] provider you are looking for under [facility] and page to a number that you can be directly reached. 4. If you still have difficulty reaching the provider, please page the [facility] (Director on Call) for the Hospitalists listed on amion for assistance.

## 2019-07-17 NOTE — Progress Notes (Signed)
Daily Progress Note   Patient Name: Troy Fox       Date: 07/17/2019 DOB: [birth year]  Age: 48 y.o. MRN#: [id number] Attending Physician: Samuella Cota, MD Primary Care Physician: Celene Squibb, MD Admit Date: 07/01/2019  Reason for Consultation/Follow-up: Establishing goals of care  Subjective: Patient is resting in bed, no sitter at bedside. He said hello upon my entry but said nothing more, even when asked questions. His breakfast tray is untouched. Per nursing, he has taken bites and sips on occassion at most.    MOST form has been completed through [facility], and the signed original was placed in the chart. A photocopy was placed in the chart to be scanned into EMR. The patient's father/HPOA outlined their wishes for the following treatment decisions:  Cardiopulmonary Resuscitation: Do Not Attempt Resuscitation (DNR/No CPR)  Medical Interventions: Comfort Measures: Keep clean, warm, and dry. Use medication by any route, positioning, wound care, and other measures to relieve pain and suffering. Use oxygen, suction and manual treatment of airway obstruction as needed for comfort. Do not transfer to the hospital unless comfort needs cannot be met in current location.  Antibiotics: No antibiotics (use other measures to relieve symptoms)  IV Fluids: No IV fluids (provide other measures to ensure comfort)  Feeding Tube: No feeding tube     Length of Stay: 13  Current Medications: Scheduled Meds:  . mouth rinse  15 mL Mouth Rinse BID  . OLANZapine  10 mg Oral QHS  . pantoprazole  40 mg Oral Daily  . senna-docusate  2 tablet Oral BID  . sodium chloride flush  3 mL Intravenous Q12H  . traZODone  100 mg Oral QHS  . cyanocobalamin  1,000 mcg Oral Daily    Continuous Infusions: .  sodium chloride Stopped (07/12/19 1547)    PRN Meds: sodium chloride, acetaminophen **OR** acetaminophen, alum & mag hydroxide-simeth, dicyclomine, diphenhydrAMINE, LORazepam, metoprolol tartrate, morphine CONCENTRATE, OLANZapine, ondansetron **OR** ondansetron (ZOFRAN) IV, polyethylene glycol, sodium chloride flush  Physical Exam Constitutional:      Comments: No distress noted.  Pulmonary:     Effort: Pulmonary effort is normal.  Neurological:     Mental Status: He is alert.             Vital Signs: BP (!) 146/96 (BP Location: Left Arm)  Pulse (!) 115   Temp 98.1 F (36.7 C)   Resp 17   Ht _0  (1.651 m)   Wt 61.1 kg   SpO2 96%   BMI 22.40 kg/m  SpO2: SpO2: 96 % O2 Device: O2 Device: Room Air O2 Flow Rate: O2 Flow Rate (L/min): 2 L/min  Intake/output summary:  No intake or output data in the 24 hours ending 07/17/19 0937 LBM: Last BM Date: 07/13/19 Baseline Weight: Weight: 65.8 kg Most recent weight: Weight: 61.1 kg       Palliative Assessment/Data: 20%     Flowsheet Rows     Most Recent Value  Intake Tab  Referral Department  Hospitalist  Unit at Time of Referral  Med/Surg Unit  Palliative Care Primary Diagnosis  Neurology  Date Notified  07/12/19  Palliative Care Type  New Palliative care  Reason for referral  Clarify Goals of Care  Date of Admission  07/01/19  Date first seen by Palliative Care  07/12/19  # of days Palliative referral response time  0 Day(s)  # of days IP prior to Palliative referral  11  Clinical Assessment  Psychosocial & Spiritual Assessment  Palliative Care Outcomes      Patient Active Problem List   Diagnosis Date Noted  . 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 Dysphagia 07/03/2019  . Abdominal pain, epigastric 05/15/2019  . Diarrhea 05/15/2019  . Hiatal hernia 05/15/2019  . Schizoaffective disorder, depressive type ([place]) 01/29/2019  . Catatonia 01/29/2019  . Mild malnutrition ([place]) 01/29/2019  . Schizoaffective  disorder, bipolar type ([place]) 01/03/2019  . Acute neuroleptic-induced akathisia 01/03/2019    Palliative Care Assessment & Plan   Recommendations/Plan:  Waiting for hospice facility placement.  Code Status:    Code Status Orders  (From admission, onward)         Start     Ordered   07/03/19 1506  Full code  Continuous     07/03/19 1505        Code Status History    Date Active Date Inactive Code Status Order ID Comments User Context   01/29/2019 1504 03/05/2019 1641 Full Code [id number]  Clapacs, Madie Reno, MD Inpatient   Advance Care Planning Activity    Advance Directive Documentation     Most Recent Value  Type of Advance Directive  Healthcare Power of Attorney  Pre-existing out of facility DNR order (yellow form or pink MOST form)  -  "MOST" Form in Place?  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 Bites and sips. Tardive Dyskinesia. ECT with poor response.    Care plan was discussed with  CM  Thank you for allowing the Palliative Medicine Team to assist in the care of this patient.   Total Time 15 min Prolonged Time Billed no      Greater than 50%  of this time was spent counseling and coordinating care related to the above assessment and plan.  Asencion Gowda, NP  Please contact Palliative Medicine Team phone at [phone] for questions and concerns.

## 2019-07-17 NOTE — TOC Progression Note (Signed)
Transition of Care [facility]) - Progression Note    Patient Details  Name: Troy Fox MRN: [id number] Date of Birth: 1970-12-27  [facility] of [facility]) CM/SW Contact  Shelbie Hutching, RN Phone Number: 07/17/2019, 10:44 AM  Clinical Narrative:    [facility] has accepted patient.  Hospice facility requires recently COVID within the past 7 days.  COVID has been ordered.  Patient will be ready for discharge as soon as COVID result is back- COVID result and discharge summary will be faxed to [phone].  Bedside RN will call report to hospice facility before discharge to [phone].     Expected Discharge Plan: Hospice Medical Facility Barriers to Discharge: Other (comment)(COVID pending)  Expected Discharge Plan and Services Expected Discharge Plan: [place]     Post Acute Care Choice: Hospice Living arrangements for the past 2 months: Single Family Home                                       Social Determinants of Health (SDOH) Interventions    Readmission Risk Interventions No flowsheet data found.

## 2019-07-17 NOTE — Progress Notes (Signed)
PROGRESS NOTE  Troy Fox JJH:[id number] DOB: 08-19-1971 DOA: 07/01/2019 PCP: Troy Squibb, MD  Brief History   48 year old man PMH bipolar disorder, catatonic schizophrenia, schizoaffective disorder, tardive dyskinesia, GERD, presented with difficulty swallowing, failure to thrive, tremors, troponin.  He was evaluated by psychiatry but felt to need a higher level of care.  He was seen by gastroenterology and found to have no structural lesion to explain his dysphagia.  Psychiatric care recommended.  He was also seen by ENT and no structural lesion was identified.  Psychiatric care recommended.  He was seen by psychiatry and underwent ECT but did not respond so it was discontinued.  He was seen by speech therapy which recommended dysphagia 1 diet but there was concern the patient will be unable to maintain his nutritional status.  He was seen by palliative medicine and in discussion with the patient's father, given the patient's poor response to treatment, he was made comfort care and plans were made for transition to residential hospice.  A & P  Dysphagia thought secondary to psychiatric disorder.  EGD October 2 showed some gastritis but normal esophagus.  Previous barium swallow showed normal esophagus and paraesophageal hernia.  No sign of obstructive lesion amenable to endoscopic intervention.  Seen by ENT and underwent diagnostic fiberoptic nasolaryngoscopy.  No lesions noted.  No gross infection noted.  ENT recommended attention to psychiatric care and ongoing speech therapy.  SLP with concern for maintaining nutritional and hydration status.  Palliative care following, patient transitioned to full comfort care --Continue comfort care.  Residential hospice has a bed.  Covid test ordered today. --SLP recommended dysphagia level 1 with thin liquids.  Strict reflux precautions.  General aspiration precautions medications crushed in pure.  Bipolar 1 disorder, catatonic schizophrenia,  schizoaffective disorder, tardive dyskinesia --Underwent electroconvulsive therapy x3 without much improvement.  Psychiatry has signed off.  Continue full comfort care.  Continue Zyprexa.  GERD --PPI  No significant change in clinical appearance.  Monitor clinically.  Etiology and of chest pain is unclear but of doubtful significance.  DVT prophylaxis: not indicated based on GOC Code Status: DNR Family Communication: none Disposition Plan: Hospice facility when bed available.   Troy Hodgkins, MD  [facility] Direct contact: see [email or web address] (further directions at bottom of note if needed) 7PM-7AM contact night coverage as at bottom of note 07/17/2019, 2:44 PM  LOS: 13 days   Significant Hospital Events   . 10/18 admitted for failure to thrive, dysphagia   Consults:  . GI . ENT . Psychiatry 10/23   Procedures:  . 10/21 diagnostic fiberoptic nasolaryngoscopy. Findings:Nasal cavity and nasopharynx clear. TVC are clear and mobile. No lesions of the larynx or hypopharynx. Moderate lingual tonsillar hypertrophy, but no gross infection and no masses. Dry mucous membranes . 10/23 ECT . 10/26 ECT . 10/28 ECT  Significant Diagnostic Tests:  Marland Kitchen    Micro Data:  .    Antimicrobials:  .   Interval History/Subjective  Reports some pain in chest.     Objective   Vitals:  Vitals:   07/16/19 0544 07/16/19 1956  BP: 115/87 (!) 146/96  Pulse: 97 (!) 115  Resp: 16 17  Temp: 98.3 F (36.8 C) 98.1 F (36.7 C)  SpO2: 99% 96%    Exam:  Constitutional.  Appears anxious.  Mildly uncomfortable.  Nontoxic. Respiratory.  Clear to auscultation bilaterally.  No wheezes, rales or rhonchi.  Normal respiratory effort. Cardiovascular.  Regular rate and rhythm.  No murmur, rub or gallop.  No lower extremity edema. Psychiatric.  Flat affect.  Mood difficult to assess.  I have personally reviewed the following:   Today's Data  . No labs today  Scheduled Meds: . mouth rinse  15  mL Mouth Rinse BID  . OLANZapine  10 mg Oral QHS  . pantoprazole  40 mg Oral Daily  . senna-docusate  2 tablet Oral BID  . sodium chloride flush  3 mL Intravenous Q12H  . traZODone  100 mg Oral QHS  . cyanocobalamin  1,000 mcg Oral Daily   Continuous Infusions: . sodium chloride Stopped (07/12/19 1547)    Principal Problem:   Dysphagia Active Problems:   Schizoaffective disorder, bipolar type (HCC)   Schizoaffective disorder, depressive type (HCC)   Catatonic schizophrenia (HCC)   LOS: 13 days   How to contact the [facility] Attending or Consulting provider 7A - 7P or covering provider during after hours 7P -7A, for this patient?  1. Check the care team in [facility] and look for a) attending/consulting TRH provider listed and b) the [facility] team listed 2. Log into [email or web address] and use [place]'s universal password to access. If you do not have the password, please contact the hospital operator. 3. Locate the [facility] provider you are looking for under [facility] and page to a number that you can be directly reached. 4. If you still have difficulty reaching the provider, please page the [facility] (Director on Call) for the Hospitalists listed on amion for assistance.

## 2019-07-18 DIAGNOSIS — R1312 Dysphagia, oropharyngeal phase: Secondary | ICD-10-CM

## 2019-07-18 DIAGNOSIS — F202 Catatonic schizophrenia: Principal | ICD-10-CM

## 2019-07-18 DIAGNOSIS — F319 Bipolar disorder, unspecified: Secondary | ICD-10-CM

## 2019-07-18 DIAGNOSIS — R627 Adult failure to thrive: Secondary | ICD-10-CM

## 2019-07-18 LAB — NOVEL CORONAVIRUS, NAA (HOSP ORDER, SEND-OUT TO REF LAB; TAT 18-24 HRS): SARS-CoV-2, NAA: NOT DETECTED

## 2019-07-18 MED ORDER — LORAZEPAM 2 MG/ML PO CONC: 1.0000 mg | ORAL | Status: DC | PRN

## 2019-07-18 MED ORDER — LORAZEPAM 2 MG/ML PO CONC: 1.0000 mg | ORAL | 0 refills | Status: AC | PRN

## 2019-07-18 MED ORDER — MORPHINE SULFATE (CONCENTRATE) 10 MG/0.5ML PO SOLN: 5.0000 mg | ORAL | 0 refills | Status: AC | PRN

## 2019-07-18 MED ORDER — ACETAMINOPHEN 650 MG RE SUPP: 650.0000 mg | Freq: Four times a day (QID) | RECTAL | 0 refills | Status: AC | PRN

## 2019-07-18 NOTE — Progress Notes (Signed)
Pt d/c to [facility] per EMS. IV removed per hospice RN Butch Penny. IV removed intact.

## 2019-07-18 NOTE — TOC Transition Note (Signed)
Transition of Care [facility]) - CM/SW Discharge Note   Patient Details  Name: Troy Fox MRN: [id number] Date of Birth: August 09, 1971  [facility]) CM/SW Contact:  Shelbie Hutching, RN Phone Number: 07/18/2019, 8:48 AM   Clinical Narrative:    Patient will discharge to the [facility] today.  Transport with [facility] scheduled for 1030-11.  Bedside RN will call report to [phone].  [facility] will notify the patient's father of transport plans.     Final next level of care: [place] Barriers to Discharge: Barriers Resolved   Patient Goals and CMS Choice Patient states their goals for this hospitalization and ongoing recovery are:: Patient not fully oriented. CMS Medicare.gov Compare Post Acute Care list provided to:: Patient Represenative (must comment) Choice offered to / list presented to : [facility] POA / Guardian, Parent(father)  Discharge Placement              Patient chooses bed at: [facility] [place]) Patient to be transferred to facility by: [facility] Name of family member notified: Donny Pique- Father Patient and family notified of of transfer: 07/18/19  Discharge Plan and Services     Post Acute Care Choice: Hospice                               Social Determinants of Health (SDOH) Interventions     Readmission Risk Interventions No flowsheet data found.

## 2019-07-18 NOTE — Discharge Summary (Signed)
[facility]   PATIENT NAME: Troy DuhamelStephen Fox    MR#:  [id number]  DATE OF BIRTH:  04/01/1971  DATE OF ADMISSION:  07/01/2019 ADMITTING PHYSICIAN: Ihor AustinPavan Pyreddy, MD  DATE OF DISCHARGE: 07/18/2019  PRIMARY CARE PHYSICIAN: Benita StabileHall, John Z, MD    ADMISSION DIAGNOSIS:  Shaking [R25.1] Drooling [K11.7] Psychiatric illness [F99]  DISCHARGE DIAGNOSIS:  Catatonic Schizophrenias/p ECT therapy with no improvment Adult failure to thrive with poor nutritional status (mild-moderate) Dsyphagia  SECONDARY DIAGNOSIS:   Past Medical History:  Diagnosis Date  . Bipolar 1 disorder (HCC)   . 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 GERD (gastroesophageal reflux disease)   . Nonverbal   . Schizoaffective disorder (HCC)   . Tardive dyskinesia     HOSPITAL COURSE:  48 year old man PMH bipolar disorder, catatonic schizophrenia, schizoaffective disorder, tardive dyskinesia, GERD, presented with difficulty swallowing, failure to thrive, tremors  *Dysphagia thought secondary to psychiatric disorder. -  EGD October 2 showed some gastritis but normal esophagus.  - Barium swallow showed normal esophagus and paraesophageal hernia.  No sign of obstructive lesion amenable to endoscopic intervention.  Seen by ENT and underwent diagnostic fiberoptic nasolaryngoscopy.  No lesions noted.  No gross infection noted.  ENT recommended attention to psychiatric care and ongoing speech therapy. -  SLP with concern for maintaining nutritional and hydration status.   -Palliative care following, patient transitioned to full comfort care --Continue comfort care.  Residential hospice has a bed.  Covid test negative on 07/17/19 --SLP recommended dysphagia level 1 with thin liquids for comfort.  Strict reflux precautions.  General aspiration precautions medications crushed in pure.  *Bipolar 1 disorder, catatonic schizophrenia, schizoaffective disorder, tardive dyskinesia --Underwent  electroconvulsive therapy x3 without much improvement.  Psychiatry has signed off.  Continue full comfort care.  Continue Zyprexa.  GERD --PPI  [facility].  COVID-19 negative CONSULTS OBTAINED:  Treatment Team:  Geanie LoganBennett, Paul, MD Clapacs, Jackquline DenmarkJohn T, MD  DRUG ALLERGIES:   Allergies  Allergen Reactions  . Aripiprazole Other (See Comments)    Causes gambling addiction  *abilify     DISCHARGE MEDICATIONS:   Allergies as of 07/18/2019      Reactions   Aripiprazole Other (See Comments)   Causes gambling addiction  *abilify       Medication List    STOP taking these medications   acetaminophen 500 MG tablet Commonly known as: TYLENOL Replaced by: acetaminophen 650 MG suppository   cyanocobalamin 1000 MCG tablet   dicyclomine 20 MG tablet Commonly known as: BENTYL   FLUoxetine 40 MG capsule Commonly known as: PROZAC   Ingrezza 80 MG Caps Generic drug: Valbenazine Tosylate   Mylanta 200-200-20 MG/5ML suspension Generic drug: alum & mag hydroxide-simeth   traZODone 100 MG tablet Commonly known as: DESYREL     TAKE these medications   acetaminophen 650 MG suppository Commonly known as: TYLENOL Place 1 suppository (650 mg total) rectally every 6 (six) hours as needed for mild pain (or Fever >/= 101). Replaces: acetaminophen 500 MG tablet   LORazepam 2 MG/ML concentrated solution Commonly known as: ATIVAN Take 0.5 mLs (1 mg total) by mouth every 4 (four) hours as needed for anxiety.   morphine CONCENTRATE 10 MG/0.5ML Soln concentrated solution Take 0.25 mLs (5 mg total) by mouth every 3 (three) hours as needed for severe pain, anxiety or shortness of breath.   OLANZapine 10 MG tablet Commonly known as: ZYPREXA Take 10 mg by mouth  at bedtime.   omeprazole 40 MG capsule Commonly known as: PRILOSEC Take 40 mg by mouth daily.       If you experience worsening of your admission symptoms, develop shortness of  breath, life threatening emergency, suicidal or homicidal thoughts you must seek medical attention immediately by calling 911 or calling your MD immediately  if symptoms less severe.  You Must read complete instructions/literature along with all the possible adverse reactions/side effects for all the Medicines you take and that have been prescribed to you. Take any new Medicines after you have completely understood and accept all the possible adverse reactions/side effects.   Please note  You were cared for by a hospitalist during your hospital stay. If you have any questions about your discharge medications or the care you received while you were in the hospital after you are discharged, you can call the unit and asked to speak with the hospitalist on call if the hospitalist that took care of you is not available. Once you are discharged, your primary care physician will handle any further medical issues. Please note that NO REFILLS for any discharge medications will be authorized once you are discharged, as it is imperative that you return to your primary care physician (or establish a relationship with a primary care physician if you do not have one) for your aftercare needs so that they can reassess your need for medications and monitor your lab values. Today   SUBJECTIVE  Resting quietly   VITAL SIGNS:  Blood pressure 117/77, pulse (!) 105, temperature 98.4 F (36.9 C), temperature source Oral, resp. rate 17, height 5\' 5"  (1.651 m), weight 61.1 kg, SpO2 97 %.  I/O:    Intake/Output Summary (Last 24 hours) at 07/18/2019 0821 Last data filed at 07/17/2019 1816 Gross per 24 hour  Intake 240 ml  Output -  Net 240 ml    PHYSICAL EXAMINATION:  GENERAL:  48 y.o.-year-old patient lying in the bed with no acute distress.  EYES: Pupils equal, round, reactive to light and accommodation. No scleral icterus.  LUNGS: Normal breath sounds bilaterally, no wheezing, rales,rhonchi or crepitation. No  use of accessory muscles of respiration.  CARDIOVASCULAR: S1, S2 normal. No murmurs, rubs, or gallops.  ABDOMEN: Soft, non-tender, non-distended. Bowel sounds present. No organomegaly or mass.  EXTREMITIES: No pedal edema, cyanosis, or clubbing.  PSYCHIATRIC: The patient is lethargic   DATA REVIEW:   CBC  No results for input(s): WBC, HGB, HCT, PLT in the last 168 hours.  Chemistries  Recent Labs  Lab 07/12/19 0333  NA 141  K 3.5  CL 105  CO2 28  GLUCOSE 89  BUN 7  CREATININE 0.49*  CALCIUM 9.5    Microbiology Results   Recent Results (from the past 240 hour(s))  Novel Coronavirus, NAA (Hosp order, Send-out to Ref Lab; TAT 18-24 hrs     Status: None   Collection Time: 07/17/19 10:55 AM   Specimen: Nasopharyngeal Swab; Respiratory  Result Value Ref Range Status   SARS-CoV-2, NAA NOT DETECTED NOT DETECTED Final    Comment: (NOTE) This nucleic acid amplification test was developed and its performance characteristics determined by [employer]. Nucleic acid amplification tests include PCR and TMA. This test has not been FDA cleared or approved. This test has been authorized by FDA under an Emergency Use Authorization (EUA). This test is only authorized for the duration of time the declaration that circumstances exist justifying the authorization of the emergency use of in vitro diagnostic tests  for detection of SARS-CoV-2 virus and/or diagnosis of COVID-19 infection under section 564(b)(1) of the Act, 21 U.S.C. 833ASN-0(N) (1), unless the authorization is terminated or revoked sooner. When diagnostic testing is negative, the possibility of a false negative result should be considered in the context of a patient's recent exposures and the presence of clinical signs and symptoms consistent with COVID-19. An individual without symptoms of COVID- 19 and who is not shedding SARS-CoV-2 vi rus would expect to have a negative (not detected) result in this  assay. Performed At: [facility] [address] [place], [place] [id number] Jolene Schimke MD FX:[id number]    Coronavirus Source NASOPHARYNGEAL  Final    Comment: Performed at [facility], [address] [place], [place] [address]    RADIOLOGY:  No results found.   CODE STATUS:     Code Status Orders  (From admission, onward)         Start     Ordered   07/13/19 1024  Do not attempt resuscitation (DNR)  Continuous    Question Answer Comment  In the event of cardiac or respiratory ARREST Do not call a "code blue"   In the event of cardiac or respiratory ARREST Do not perform Intubation, CPR, defibrillation or ACLS   In the event of cardiac or respiratory ARREST Use medication by any route, position, wound care, and other measures to relive pain and suffering. May use oxygen, suction and manual treatment of airway obstruction as needed for comfort.   Comments MOST form in [place]. Copy in chart.      07/13/19 1023        Code Status History    Date Active Date Inactive Code Status Order ID Comments User Context   07/03/2019 1506 07/13/2019 1023 Full Code [id number]  Ihor Austin, MD Inpatient   01/29/2019 1504 03/05/2019 1641 Full Code [id number]  Clapacs, Jackquline Denmark, MD Inpatient   Advance Care Planning Activity    Advance Directive Documentation     Most Recent Value  Type of Advance Directive  Healthcare Power of Attorney  Pre-existing out of facility DNR order (yellow form or pink MOST form)  -  "MOST" Form in Place?  -      TOTAL TIME TAKING CARE OF THIS PATIENT: *40* minutes.    Enedina Finner M.D on 07/18/2019 at 8:21 AM  Between 7am to 6pm - Pager - [phone] After 6pm go to [email or web address] - password [email or web address]  [facility]    CC: Primary care physician; Benita Stabile, MD

## 2019-07-18 NOTE — Progress Notes (Signed)
Report given to Butch Penny at [facility].

## 2019-07-18 NOTE — Progress Notes (Signed)
Nutrition Brief Follow-Up Note  Chart reviewed. Patient has transitioned to comfort care.  No further nutrition interventions warranted at this time. Please re-consult RD as needed.  Naksh Radi King, MS, RD, LDN Office: [phone] Pager: [phone] After Hours/Weekend Pager: [phone] 

## 2019-08-30 DIAGNOSIS — D649 Anemia, unspecified: Secondary | ICD-10-CM | POA: Diagnosis not present

## 2019-08-30 DIAGNOSIS — R1312 Dysphagia, oropharyngeal phase: Secondary | ICD-10-CM | POA: Diagnosis not present

## 2019-08-30 DIAGNOSIS — R1310 Dysphagia, unspecified: Secondary | ICD-10-CM | POA: Diagnosis not present

## 2019-08-30 DIAGNOSIS — Z7401 Bed confinement status: Secondary | ICD-10-CM | POA: Diagnosis not present

## 2019-08-30 DIAGNOSIS — M6281 Muscle weakness (generalized): Secondary | ICD-10-CM | POA: Diagnosis not present

## 2019-08-30 DIAGNOSIS — R410 Disorientation, unspecified: Secondary | ICD-10-CM | POA: Diagnosis not present

## 2019-08-30 DIAGNOSIS — R404 Transient alteration of awareness: Secondary | ICD-10-CM | POA: Diagnosis not present

## 2019-08-30 DIAGNOSIS — R627 Adult failure to thrive: Secondary | ICD-10-CM | POA: Diagnosis not present

## 2019-08-30 DIAGNOSIS — R278 Other lack of coordination: Secondary | ICD-10-CM | POA: Diagnosis not present

## 2019-08-30 DIAGNOSIS — G2401 Drug induced subacute dyskinesia: Secondary | ICD-10-CM | POA: Diagnosis not present

## 2019-09-03 DIAGNOSIS — R627 Adult failure to thrive: Secondary | ICD-10-CM | POA: Diagnosis not present

## 2019-09-03 DIAGNOSIS — R1310 Dysphagia, unspecified: Secondary | ICD-10-CM | POA: Diagnosis not present

## 2019-09-03 DIAGNOSIS — G2401 Drug induced subacute dyskinesia: Secondary | ICD-10-CM | POA: Diagnosis not present

## 2019-09-30 DIAGNOSIS — E43 Unspecified severe protein-calorie malnutrition: Secondary | ICD-10-CM | POA: Diagnosis not present

## 2019-09-30 DIAGNOSIS — R1310 Dysphagia, unspecified: Secondary | ICD-10-CM | POA: Diagnosis not present

## 2019-11-08 IMAGING — CT CT ABDOMEN AND PELVIS WITH CONTRAST
2 of 5 series · 16 of 46 positions shown, 18 images · IV contrast (Isovue)
Comparison: None.

CLINICAL DATA: Left lower quadrant abdominal pain since yesterday.

EXAM:
CT ABDOMEN AND PELVIS WITH CONTRAST
TECHNIQUE: Multidetector CT imaging of the abdomen and pelvis was performed
using the standard protocol following bolus administration of
intravenous contrast.
CONTRAST:  100mL OMNIPAQUE IOHEXOL 300 MG/ML  SOLN

[Series 2: axial st · axial · 0.81mm/px · z∈[-597,-187]mm · 13 of 94 slices shown, 15 images]
[im 6/94  soft-tissue]
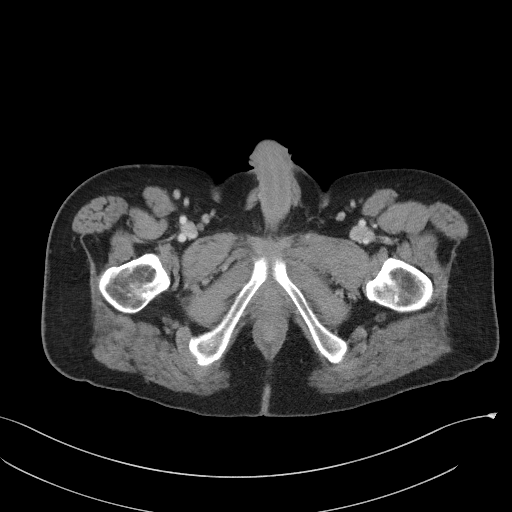
[im 6/94  bone]
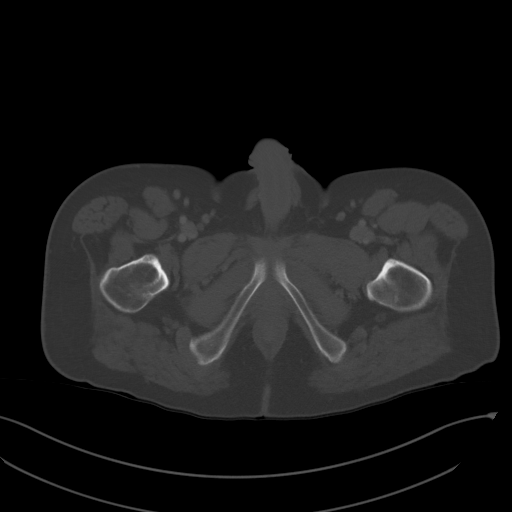
[im 11/94  soft-tissue]
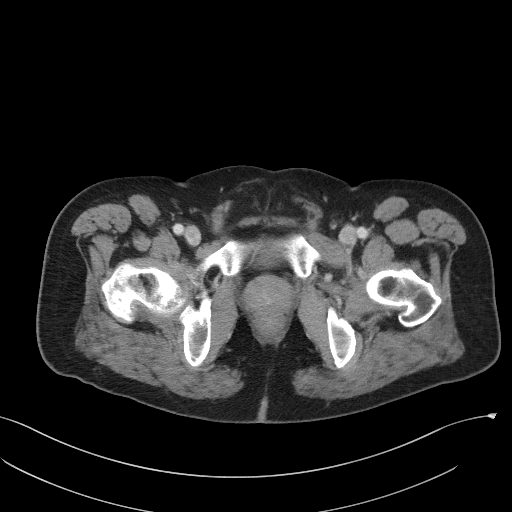
[im 22/94  soft-tissue]
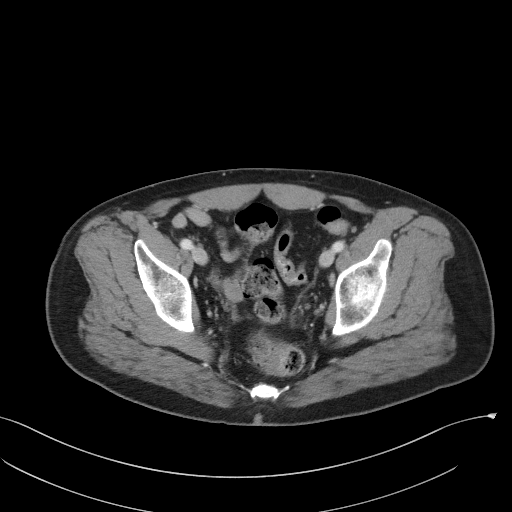
[im 28/94  soft-tissue]
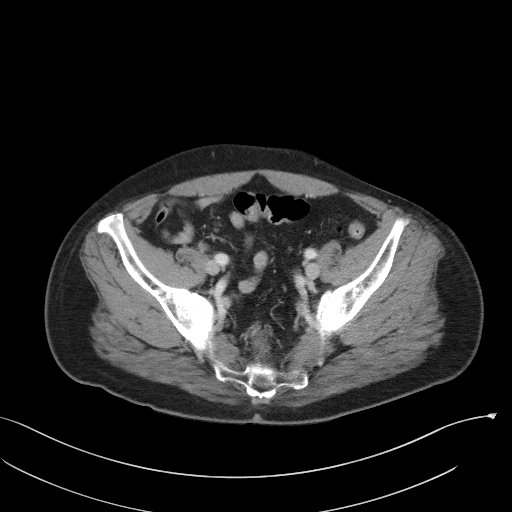
[im 33/94  soft-tissue]
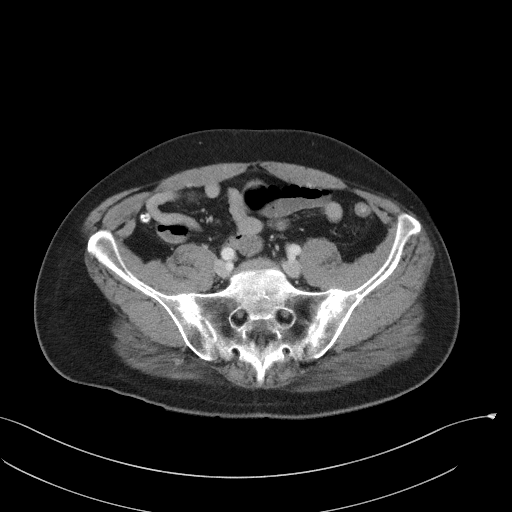
[im 39/94  soft-tissue]
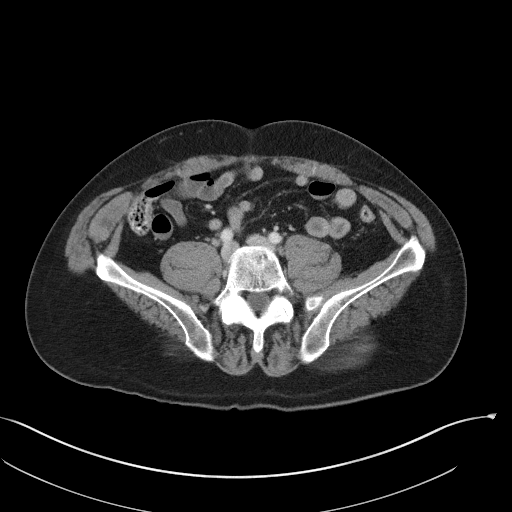
[im 50/94  soft-tissue]
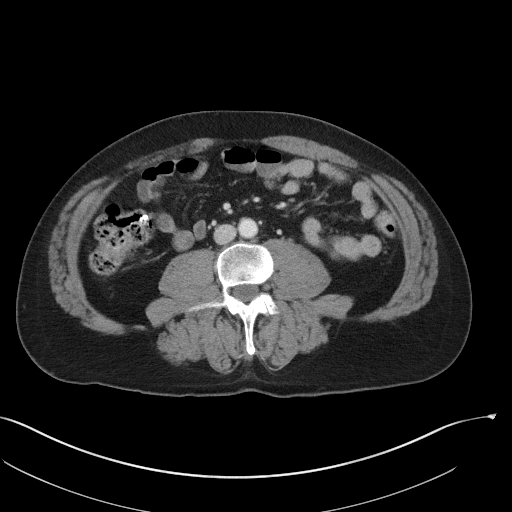
[im 55/94  soft-tissue]
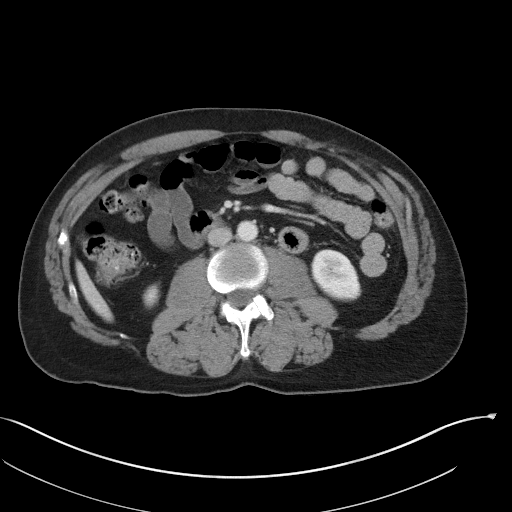
[im 61/94  soft-tissue]
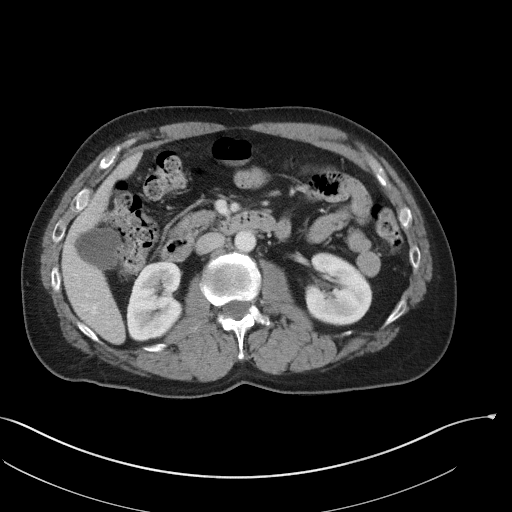
[im 61/94  bone]
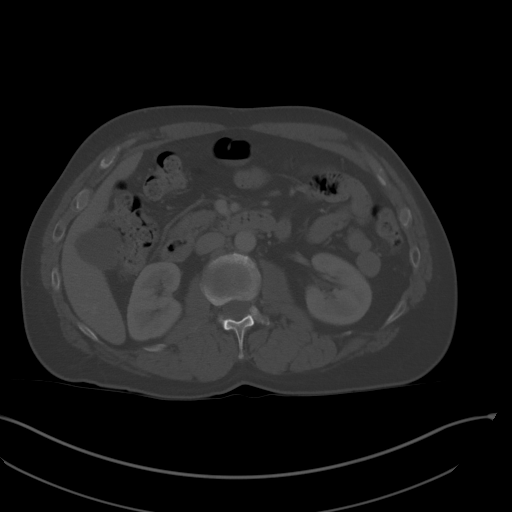
[im 66/94  soft-tissue]
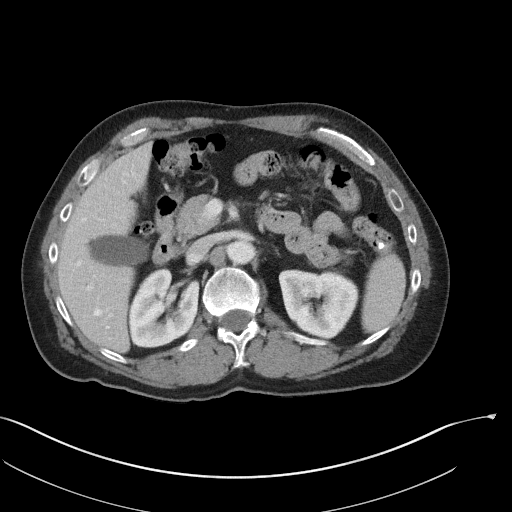
[im 72/94  soft-tissue]
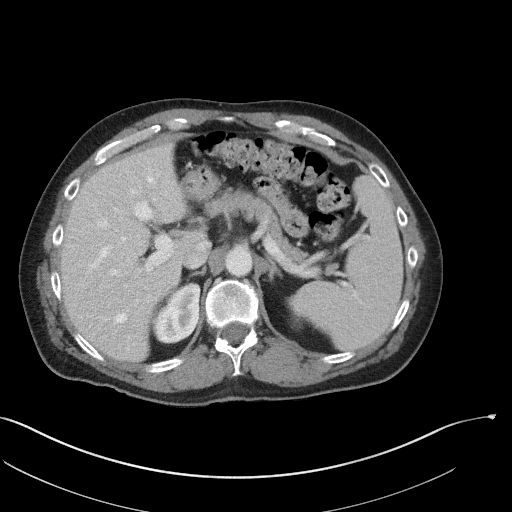
[im 83/94  soft-tissue]
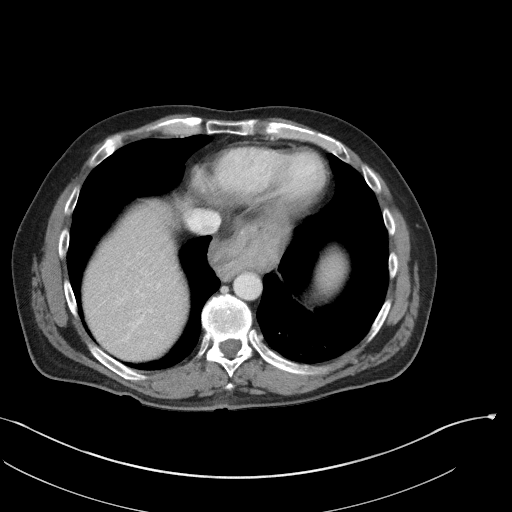
[im 88/94  soft-tissue]
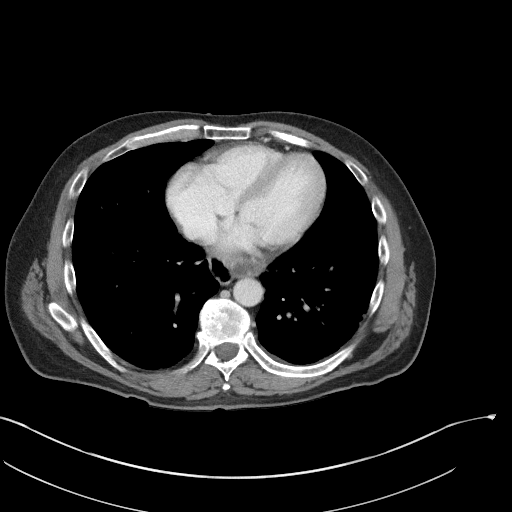

[Series 5: coronal st · coronal · 0.74mm/px · 3 of 82 slices shown]
[im 28/82  soft-tissue]
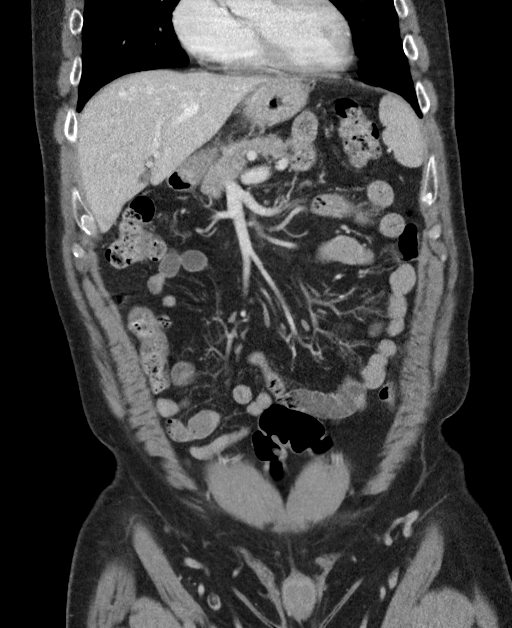
[im 37/82  soft-tissue]
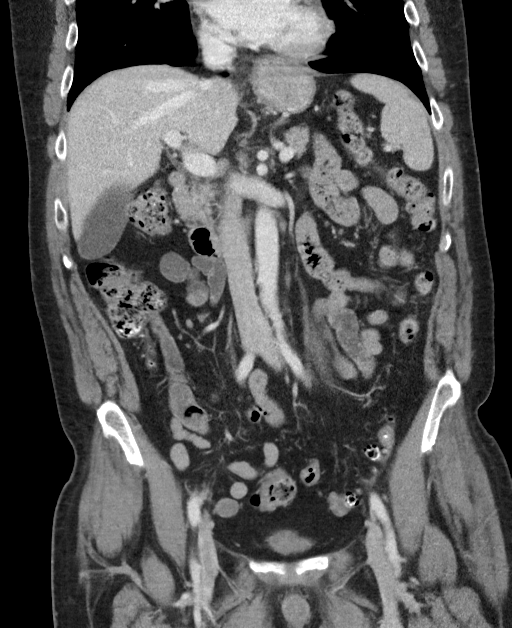
[im 46/82  soft-tissue]
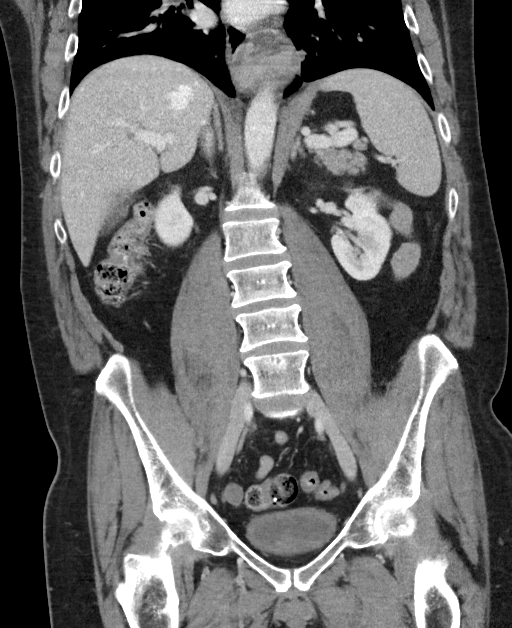

[16 of 46 positions shown; findings below may reference images not displayed]

FINDINGS: Lower chest: The lung bases are clear of an acute process. No
pleural effusions or pulmonary lesions. The heart is normal in size.
No pericardial effusion. There is a moderate to large hiatal hernia
noted. There is a paraesophageal component also.

Hepatobiliary: Area of peripheral enhancement at the left hepatic
dome most consistent with a vascular shunt. Small low-attenuation
lesion in the left lobe is likely a benign cyst. No worrisome
hepatic lesions or intrahepatic biliary dilatation. The portal and
hepatic veins are patent. The gallbladder is normal. No common bile
duct dilatation.

Pancreas: No mass, inflammation or ductal dilatation.

Spleen: Mild splenomegaly. Spleen measures 14 x 10 x 10 cm. No focal
lesions.

Adrenals/Urinary Tract: The adrenal glands and kidneys are
unremarkable. No renal, ureteral or bladder calculi or mass.

Stomach/Bowel: The stomach, duodenum, small bowel and colon are
grossly normal without oral contrast. No inflammatory changes, mass
lesions or obstructive findings. The terminal ileum and appendix are
normal. Scattered colonic diverticulosis but no findings for acute
diverticulitis.

Vascular/Lymphatic: The aorta is normal in caliber. No dissection.
The branch vessels are patent. The major venous structures are
patent. No mesenteric or retroperitoneal mass or adenopathy. Small
scattered lymph nodes are noted.

Reproductive: The prostate gland and seminal vesicles are
unremarkable.

Other: No pelvic mass or adenopathy. No free pelvic fluid
collections. No inguinal mass or adenopathy. No abdominal wall
hernia or subcutaneous lesions.

Musculoskeletal: No significant bony findings.
IMPRESSION: 1. No acute abdominal/pelvic findings, mass lesions or adenopathy.
2. No renal, ureteral or bladder calculi or mass.
3. Moderate to large hiatal hernia.
4. Benign-appearing liver lesions.

## 2019-11-12 DEATH — deceased

## 2020-01-24 IMAGING — DX DG ABDOMEN 1V
2 series · 2 of 2 positions shown · non-contrast
Comparison: 07/11/2019

CLINICAL DATA: Abdominal pain.

EXAM:
ABDOMEN - 1 VIEW

[abdomen supine (1 of 2)]
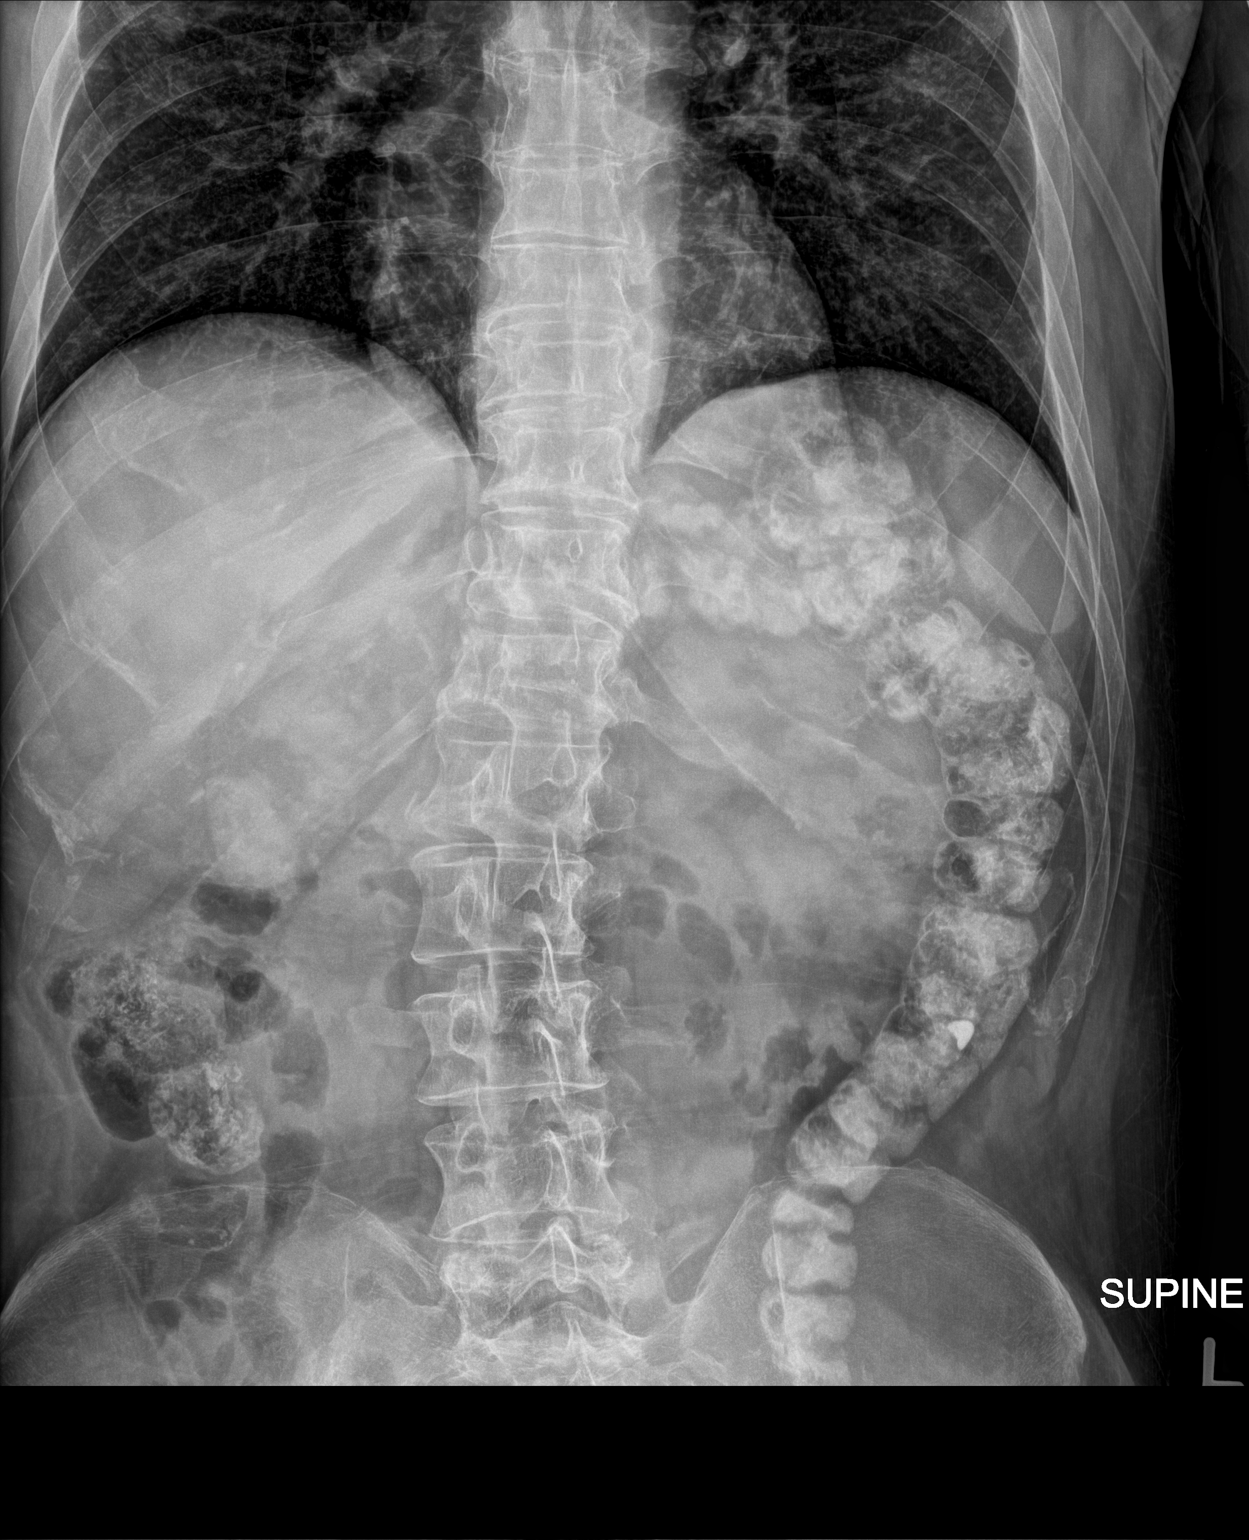

[abdomen supine (2 of 2)]
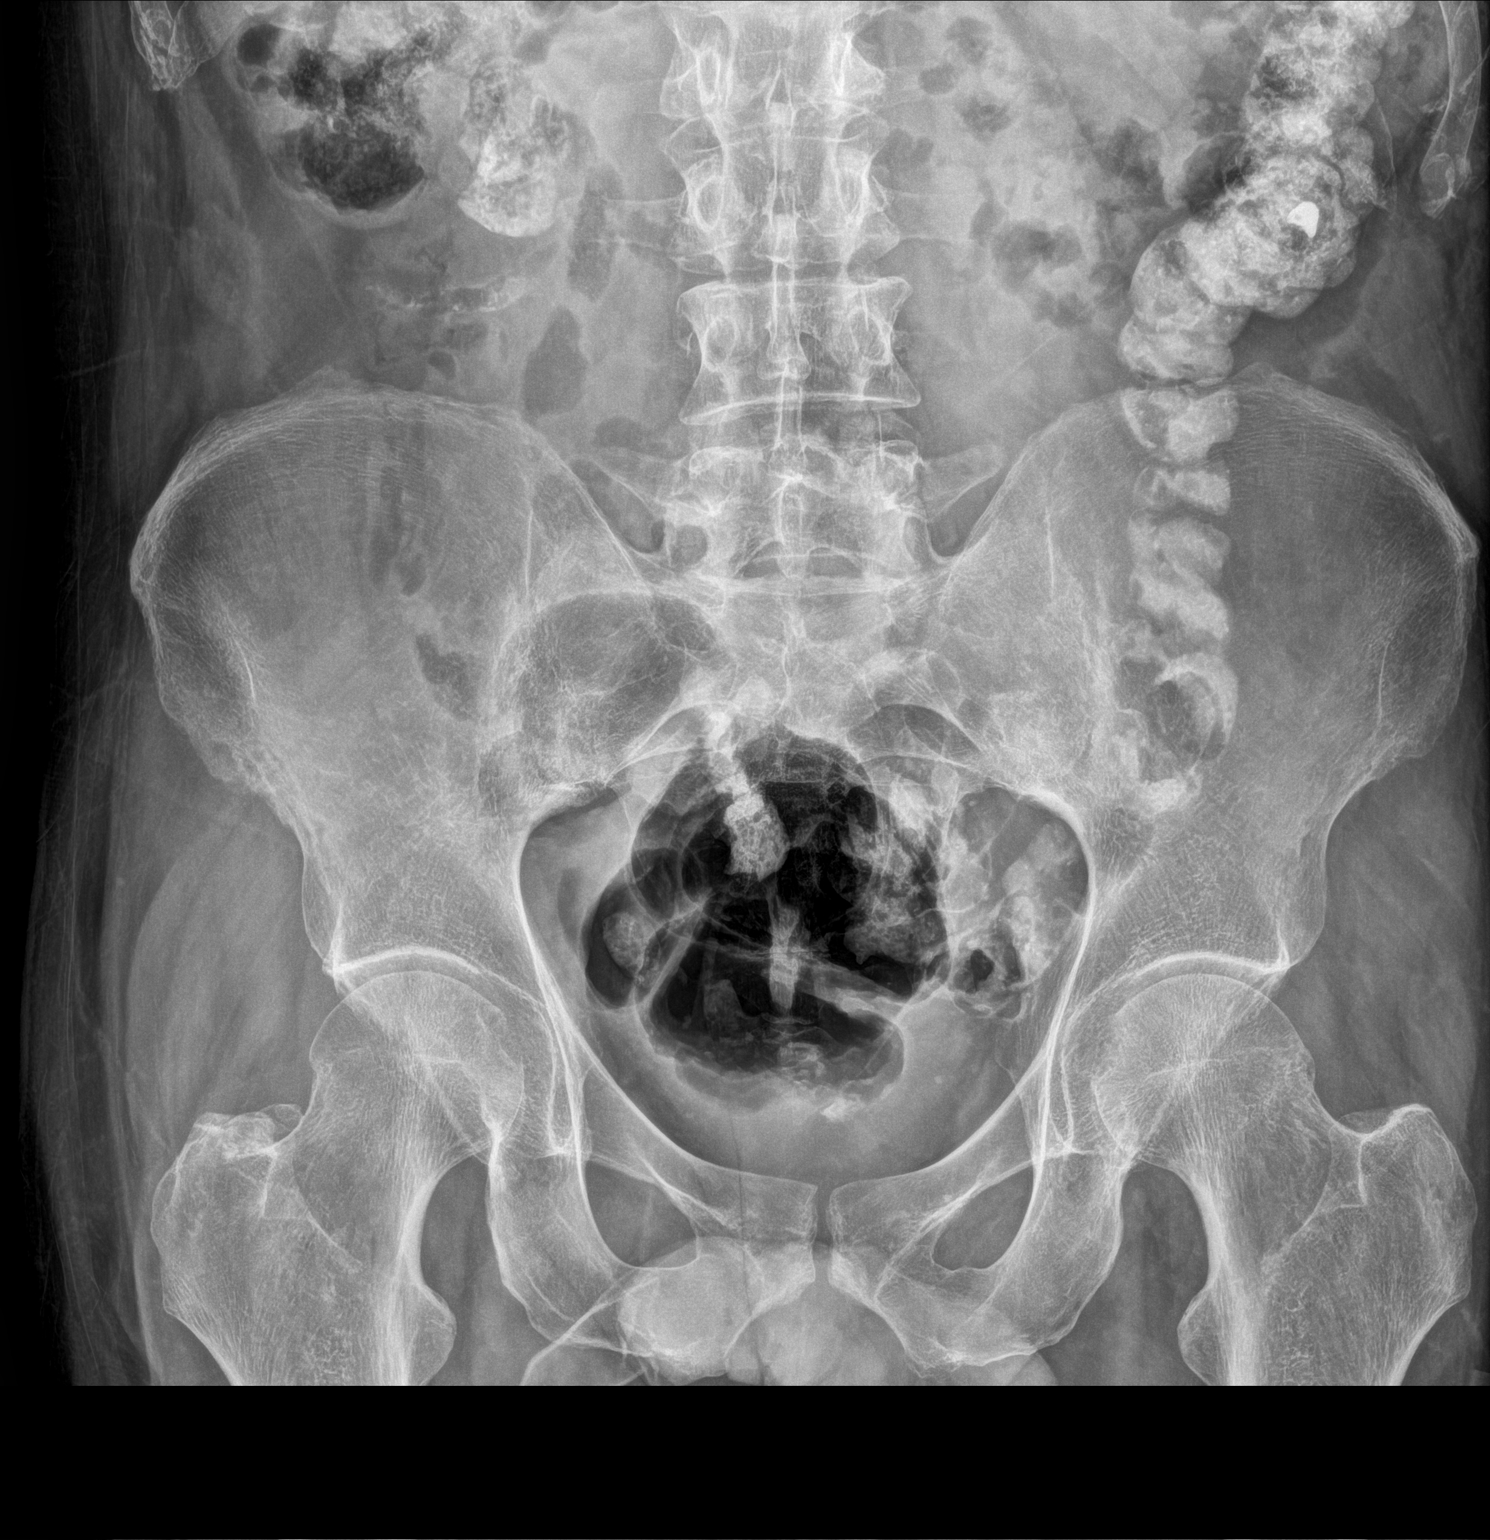

[2 of 2 positions shown; findings below may reference images not displayed]

FINDINGS: Bowel gas pattern is nonobstructive. Mild amount of contrast is seen
throughout the colon along with air and stool. No dilated small
bowel loops. No air-fluid levels. No free peritoneal air. Mild
degenerate change of the spine.
IMPRESSION: Nonobstructive bowel gas pattern.
